# Patient Record
Sex: Female | Born: 1970 | Race: Black or African American | Hispanic: No | Smoking: Never smoker
Health system: Southern US, Community
[De-identification: ages and names within clinical notes are randomized; demographics above are authoritative.]

## PROBLEM LIST (undated history)

## (undated) DIAGNOSIS — E785 Hyperlipidemia, unspecified: Secondary | ICD-10-CM

## (undated) DIAGNOSIS — G4730 Sleep apnea, unspecified: Secondary | ICD-10-CM

## (undated) DIAGNOSIS — E119 Type 2 diabetes mellitus without complications: Secondary | ICD-10-CM

## (undated) DIAGNOSIS — E079 Disorder of thyroid, unspecified: Secondary | ICD-10-CM

## (undated) HISTORY — PX: OTHER SURGICAL HISTORY: SHX169

## (undated) HISTORY — PX: ABDOMINAL HYSTERECTOMY: SHX81

## (undated) HISTORY — DX: Disorder of thyroid, unspecified: E07.9

## (undated) HISTORY — PX: TUBAL LIGATION: SHX77

---

## 2004-12-04 ENCOUNTER — Ambulatory Visit: Payer: Self-pay

## 2005-06-08 ENCOUNTER — Emergency Department: Payer: Self-pay | Admitting: Emergency Medicine

## 2009-06-11 LAB — HM PAP SMEAR: HM Pap smear: NEGATIVE

## 2010-09-29 ENCOUNTER — Ambulatory Visit: Payer: Self-pay | Admitting: Internal Medicine

## 2010-09-29 IMAGING — US TRANSABDOMINAL ULTRASOUND OF PELVIS
1 series · 17 of 25 positions shown · non-contrast
Comparison: none

REASON FOR EXAM: adnexal pain on pelvic exam
COMMENTS:

[Series 1: transabdominal ultrasound of pelvis · 17 of 85 slices shown]
[im 1/85]
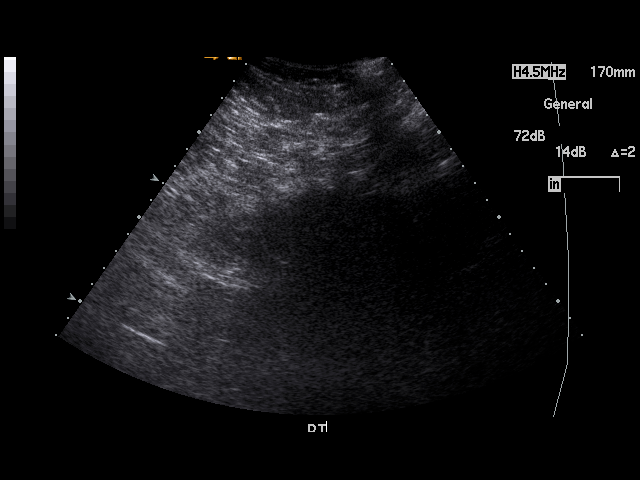
[im 8/85]
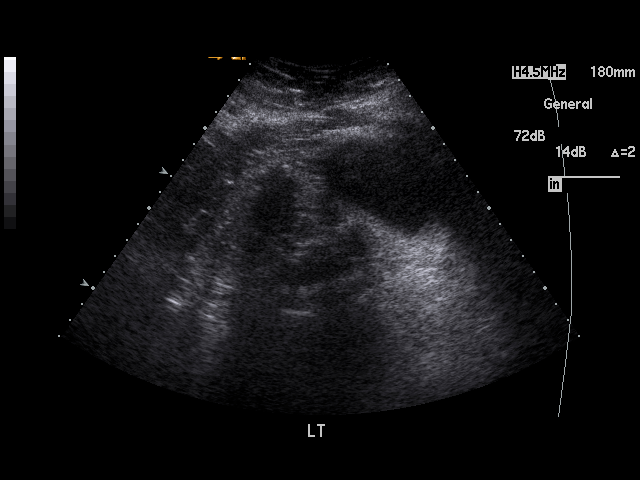
[im 11/85]
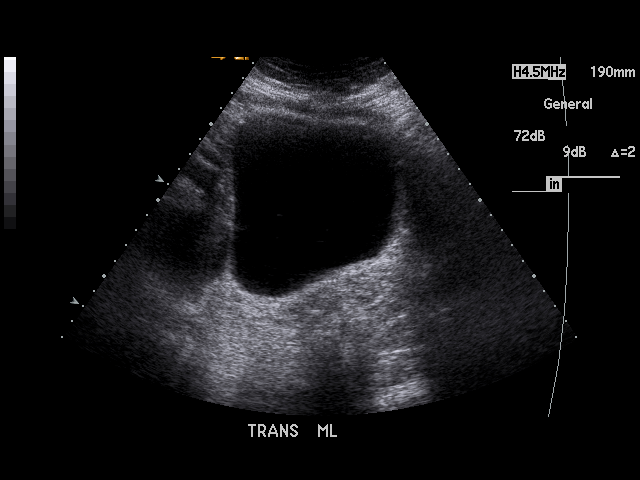
[im 18/85]
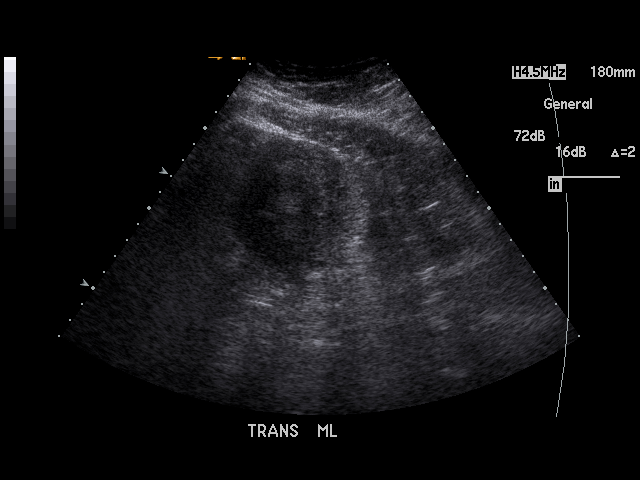
[im 22/85]
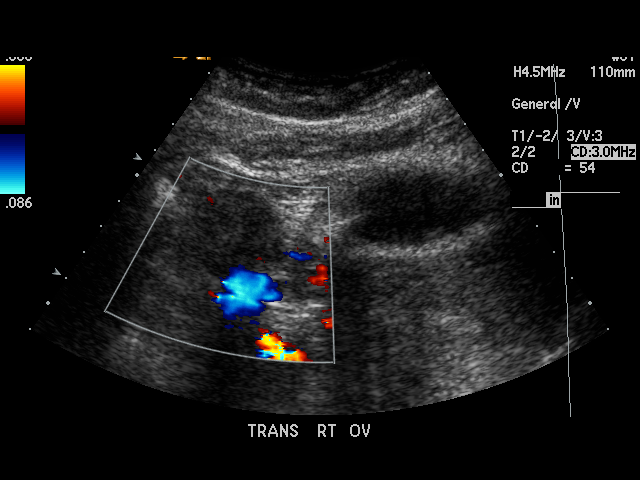
[im 29/85]
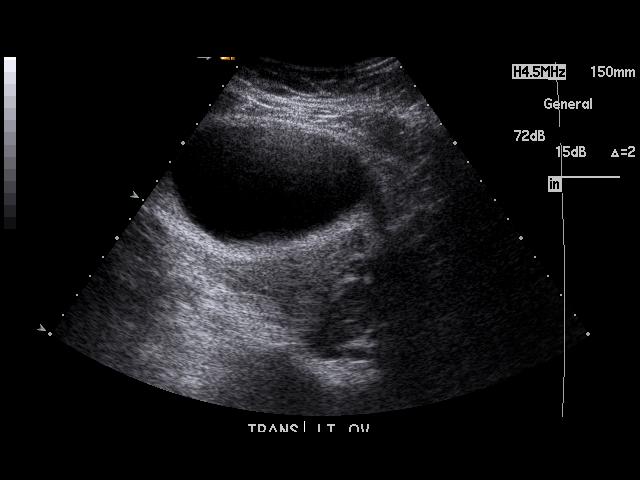
[im 32/85]
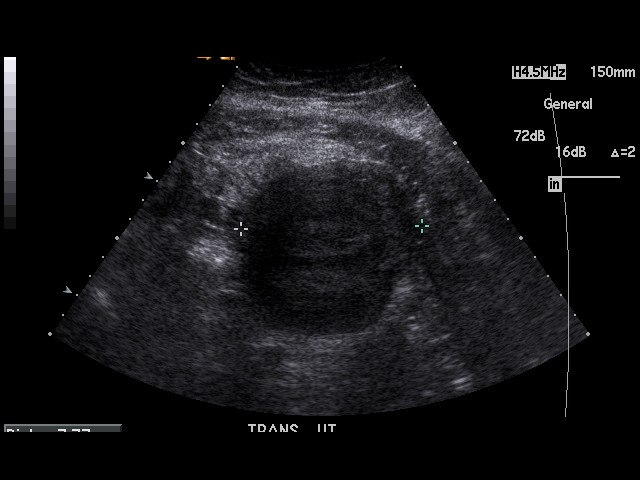
[im 39/85]
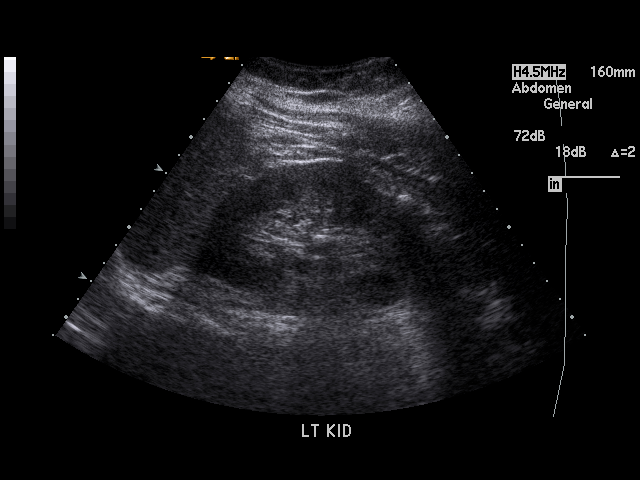
[im 43/85]
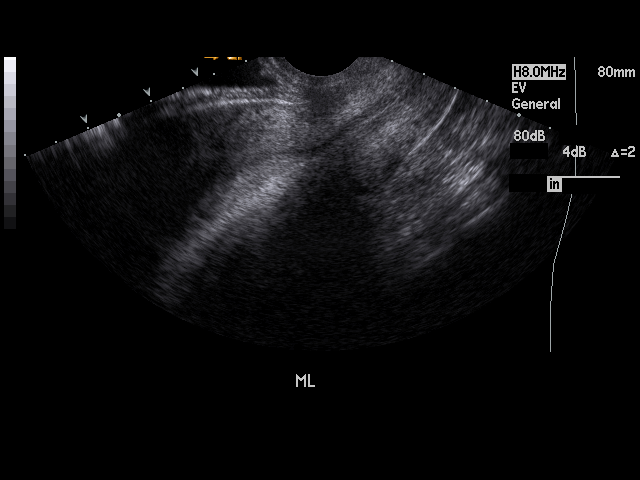
[im 46/85]
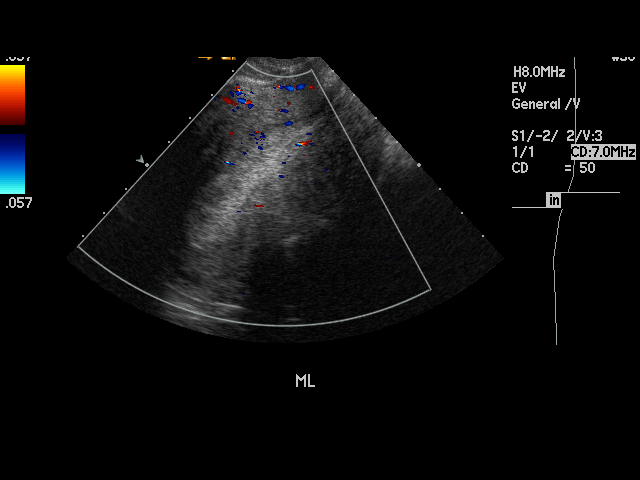
[im 53/85]
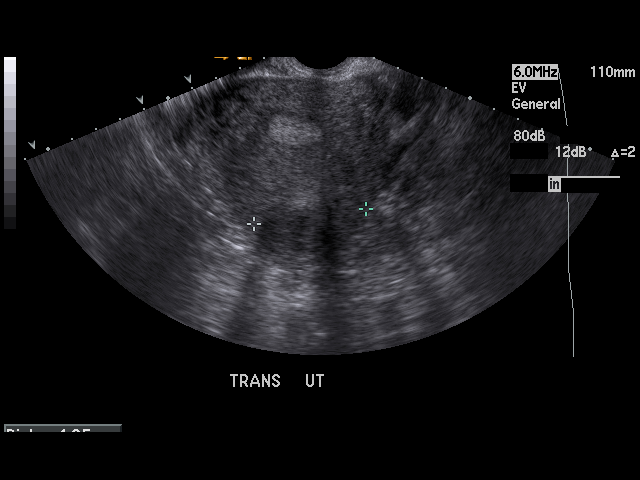
[im 57/85]
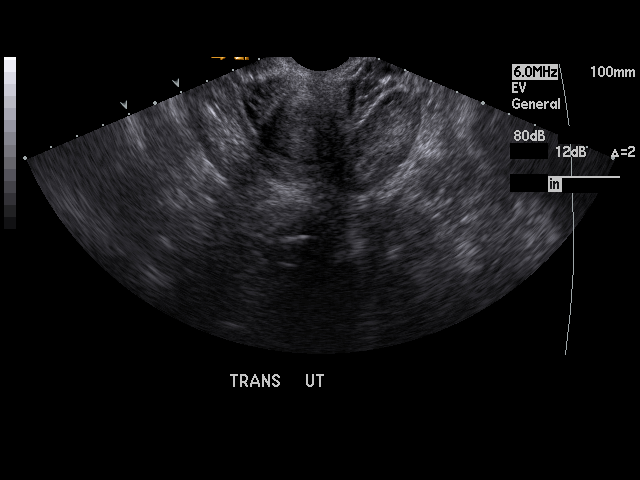
[im 64/85]
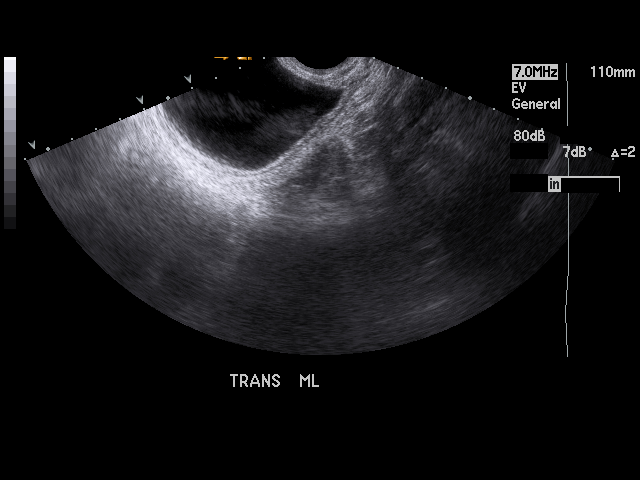
[im 67/85]
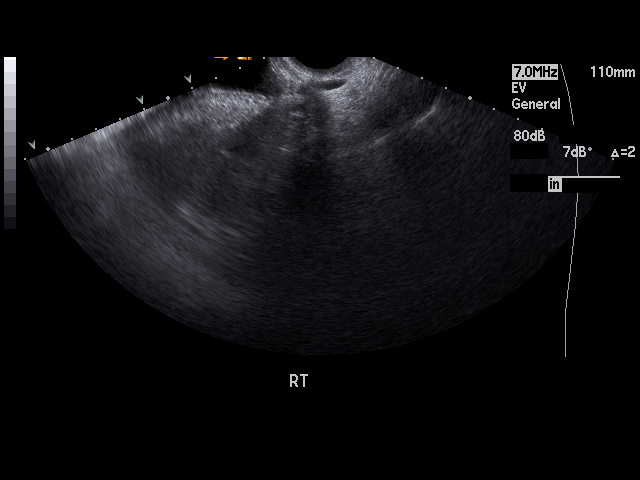
[im 74/85]
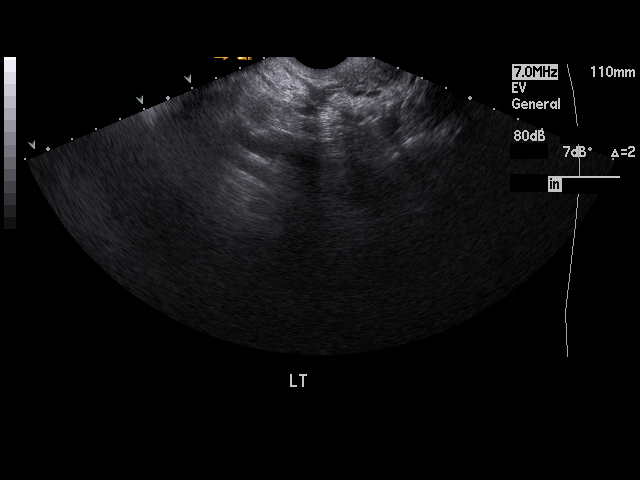
[im 78/85]
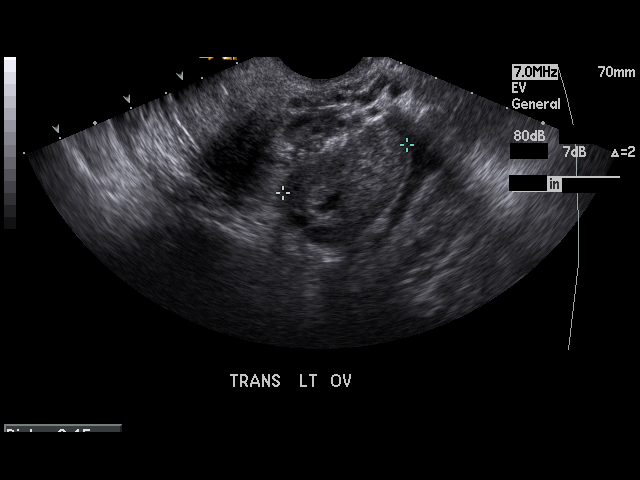
[im 85/85]
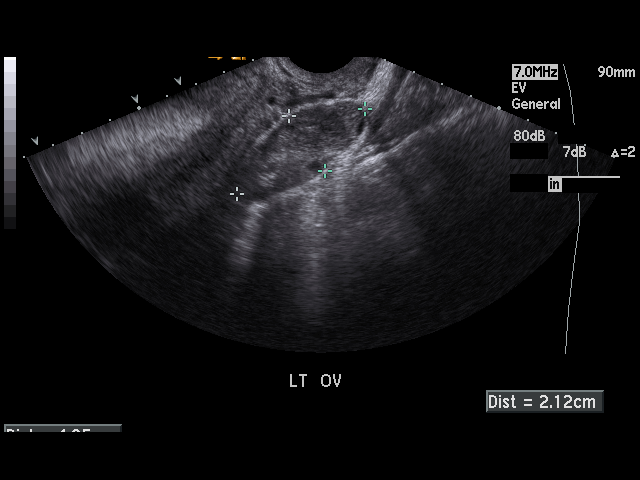

[17 of 25 positions shown; findings below may reference images not displayed]

PROCEDURE:     YIYIN - YIYIN PELVIS NON-OB W/TRANSVAGINAL  - [DATE]  [DATE]

RESULT:     Transabdominal and endovaginal ultrasound was performed. The
uterus measures 15 cm x 7.55 cm x 7.77 cm.  In the uterine fundus, there is
a 4.68 cm mass consistent with a uterine fibroid. No other uterine masses
are seen. The endometrium measures 1.63 cm in thickness. The right and left
ovaries are visualized. The right ovary measures 3.77 cm at maximum diameter
and the left ovary measures 4.95 cm at maximum diameter. Vascular flow is
observed in each ovary on Doppler examination. No abnormal adnexal masses
are seen. No free fluid is observed in the pelvis. The visualized portion of
the urinary bladder is normal in appearance. The kidneys show no
hydronephrosis.
IMPRESSION: 1.  There is a 4.68 cm uterine mass consistent with a uterine fibroid at the
uterine fundus.
2.  Otherwise, normal study.

## 2010-11-03 ENCOUNTER — Ambulatory Visit: Payer: Self-pay | Admitting: Obstetrics and Gynecology

## 2010-11-10 ENCOUNTER — Inpatient Hospital Stay: Payer: Self-pay | Admitting: Obstetrics and Gynecology

## 2010-12-02 ENCOUNTER — Ambulatory Visit: Payer: Self-pay | Admitting: Internal Medicine

## 2010-12-02 IMAGING — MG MAM DGTL SCREENING MAMMO W/CAD
1 series · 4 of 4 positions shown · non-contrast
Comparison: none

REASON FOR EXAM: SCR MAMMO
COMMENTS:

PROCEDURE:     MAM - MAM DGTL SCREENING MAMMO W/CAD  - [DATE]  [DATE]
RESULT:
No dominant masses or pathologic clustered calcifications demonstrated. CAD
evaluation is nonfocal.

[Series 8398: R CC · right · 4 of 4 slices shown]
[im 1/4]
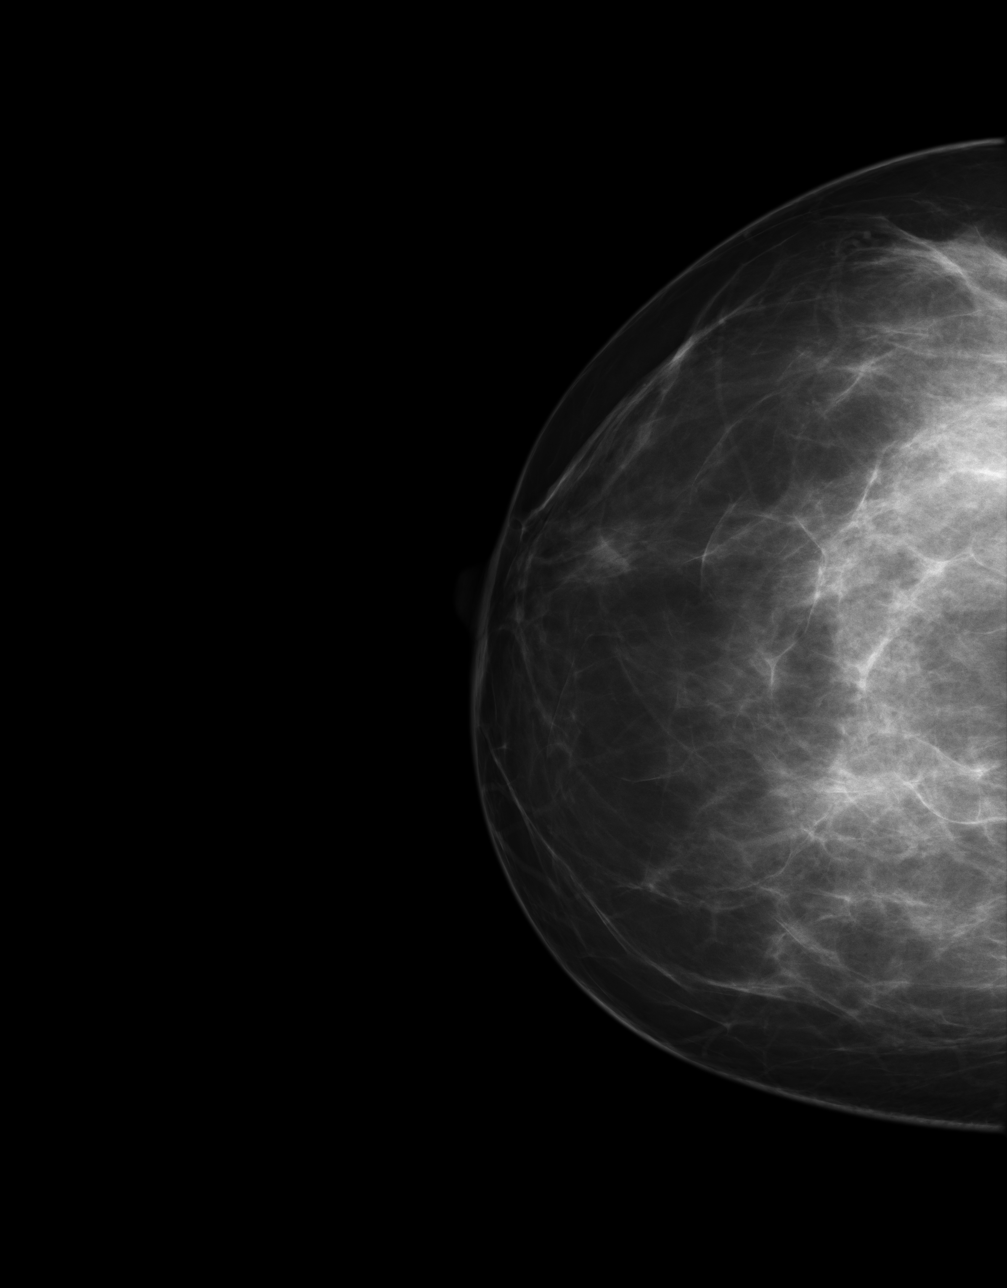
[im 2/4]
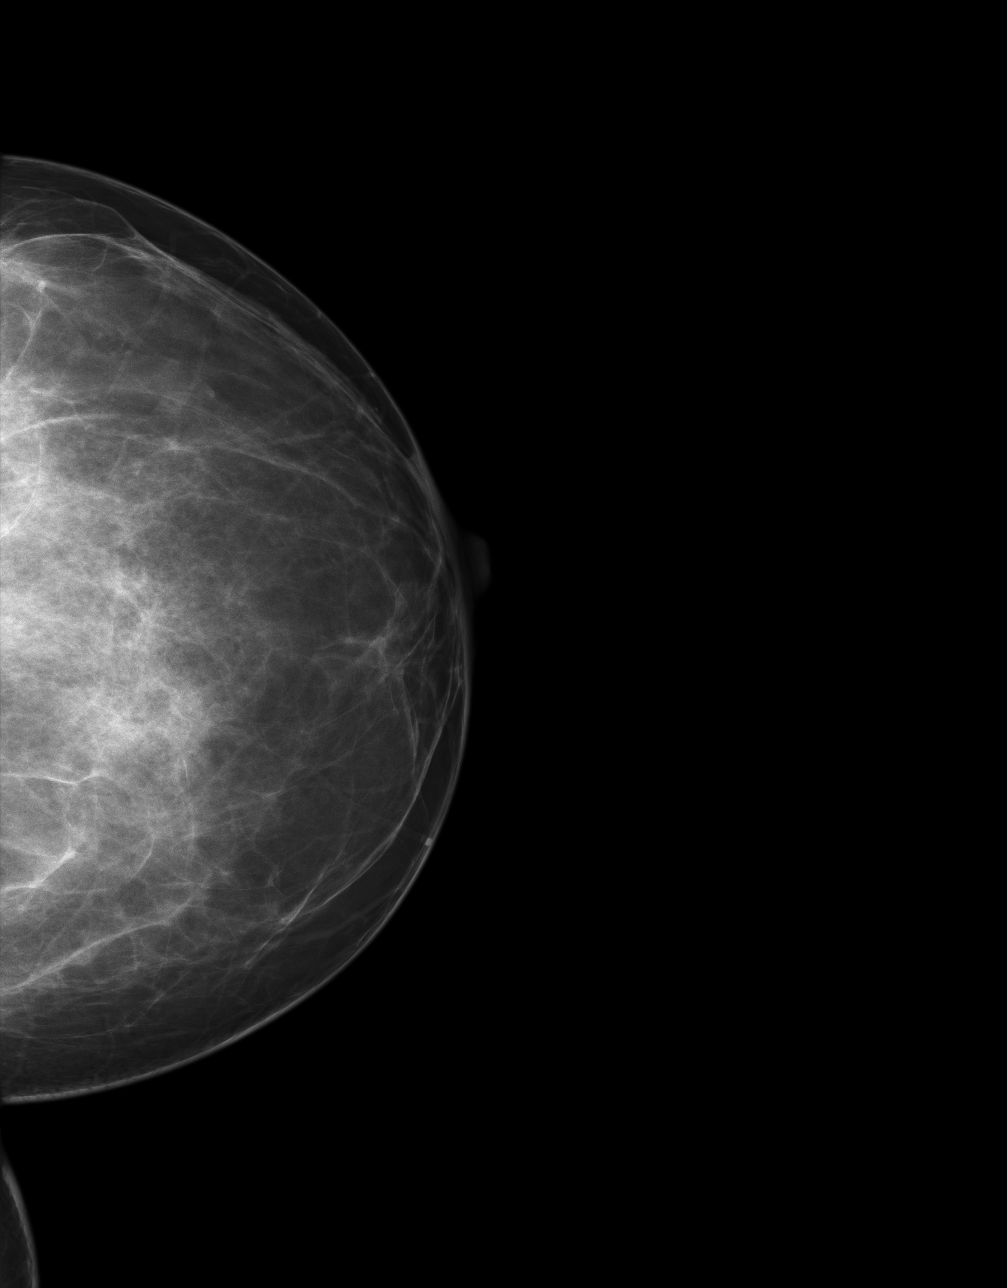
[im 3/4]
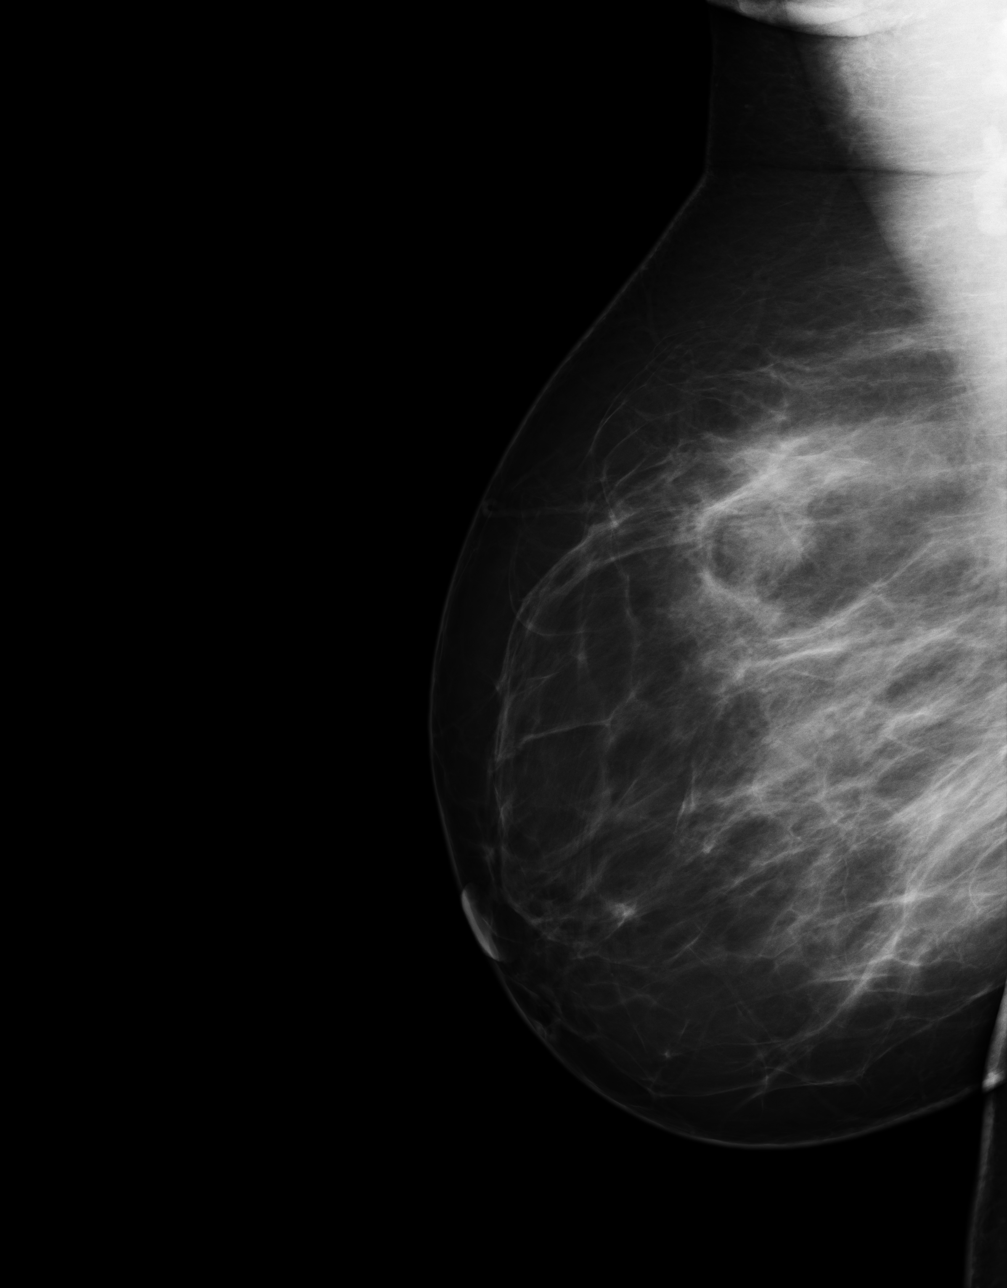
[im 4/4]
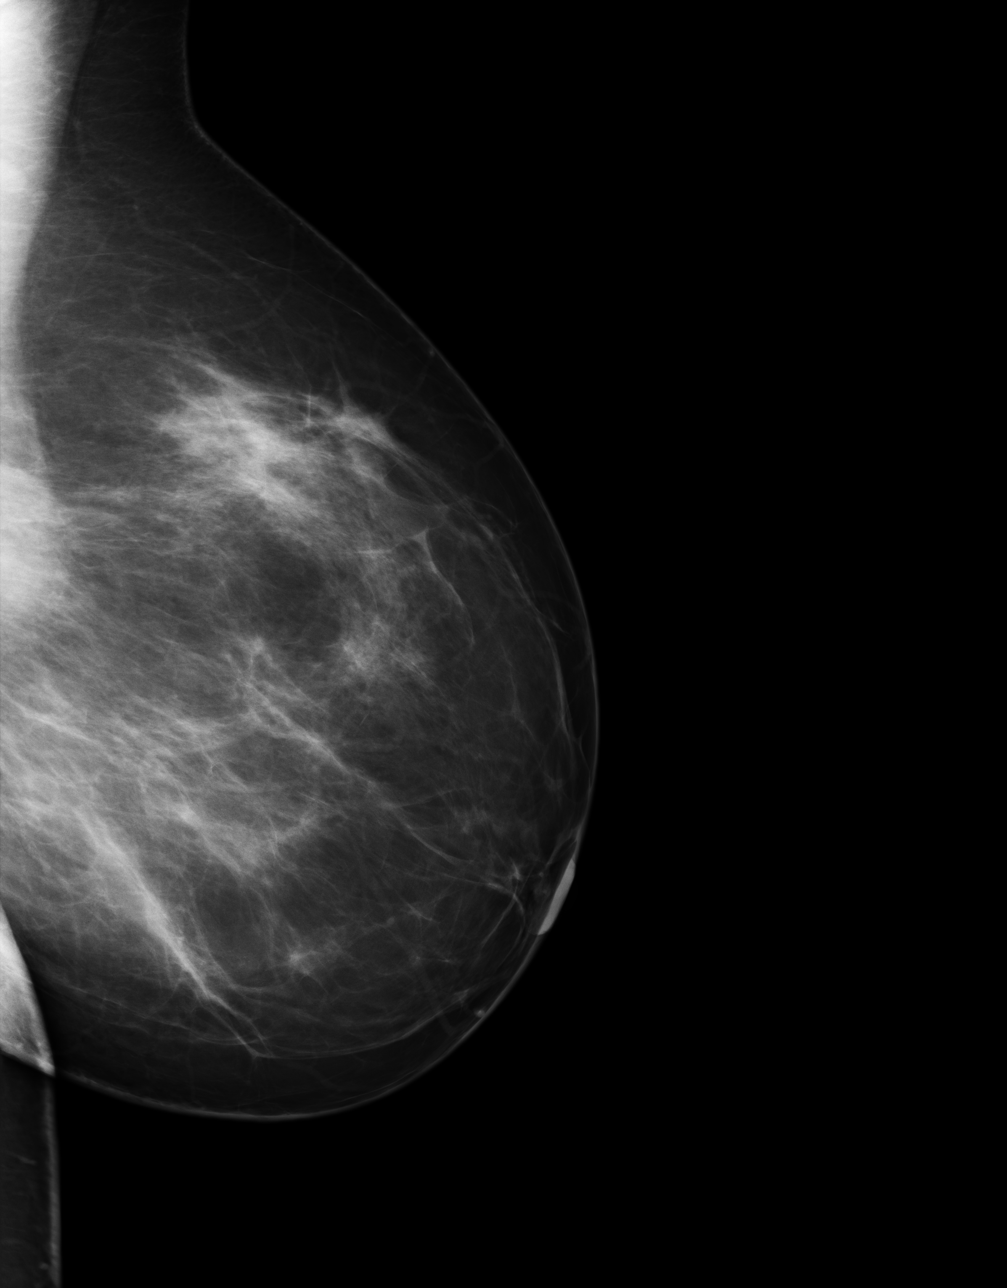

[4 of 4 positions shown; findings below may reference images not displayed]

IMPRESSION: Benign exam. Routine yearly follow-up mammograms suggested.

BI-RADS: Category 2 - Benign Findings

Thank you for this opportunity to contribute to the care of your patient.

A NEGATIVE MAMMOGRAM REPORT DOES NOT PRECLUDE BIOPSY OR OTHER EVALUATION OF
A CLINICALLY PALPABLE OR OTHERWISE SUSPICIOUS MASS OR LESION. BREAST CANCER
MAY NOT BE DETECTED BY MAMMOGRAPHY IN UP TO 10% OF CASES.

## 2011-11-19 ENCOUNTER — Emergency Department: Payer: Self-pay | Admitting: Emergency Medicine

## 2014-01-14 ENCOUNTER — Emergency Department: Payer: Self-pay | Admitting: Emergency Medicine

## 2014-01-14 IMAGING — CR RIGHT HAND - COMPLETE 3+ VIEW
1 series · 4 of 4 positions shown · non-contrast
Comparison: None.

CLINICAL DATA: Right hand and wrist pain since yesterday morning.
Woke up with pain and swelling. No known injury.

EXAM:
RIGHT HAND - COMPLETE 3+ VIEW

[Series 1: pa · 0.17mm/px · 4 of 4 slices shown]
[im 1/4]
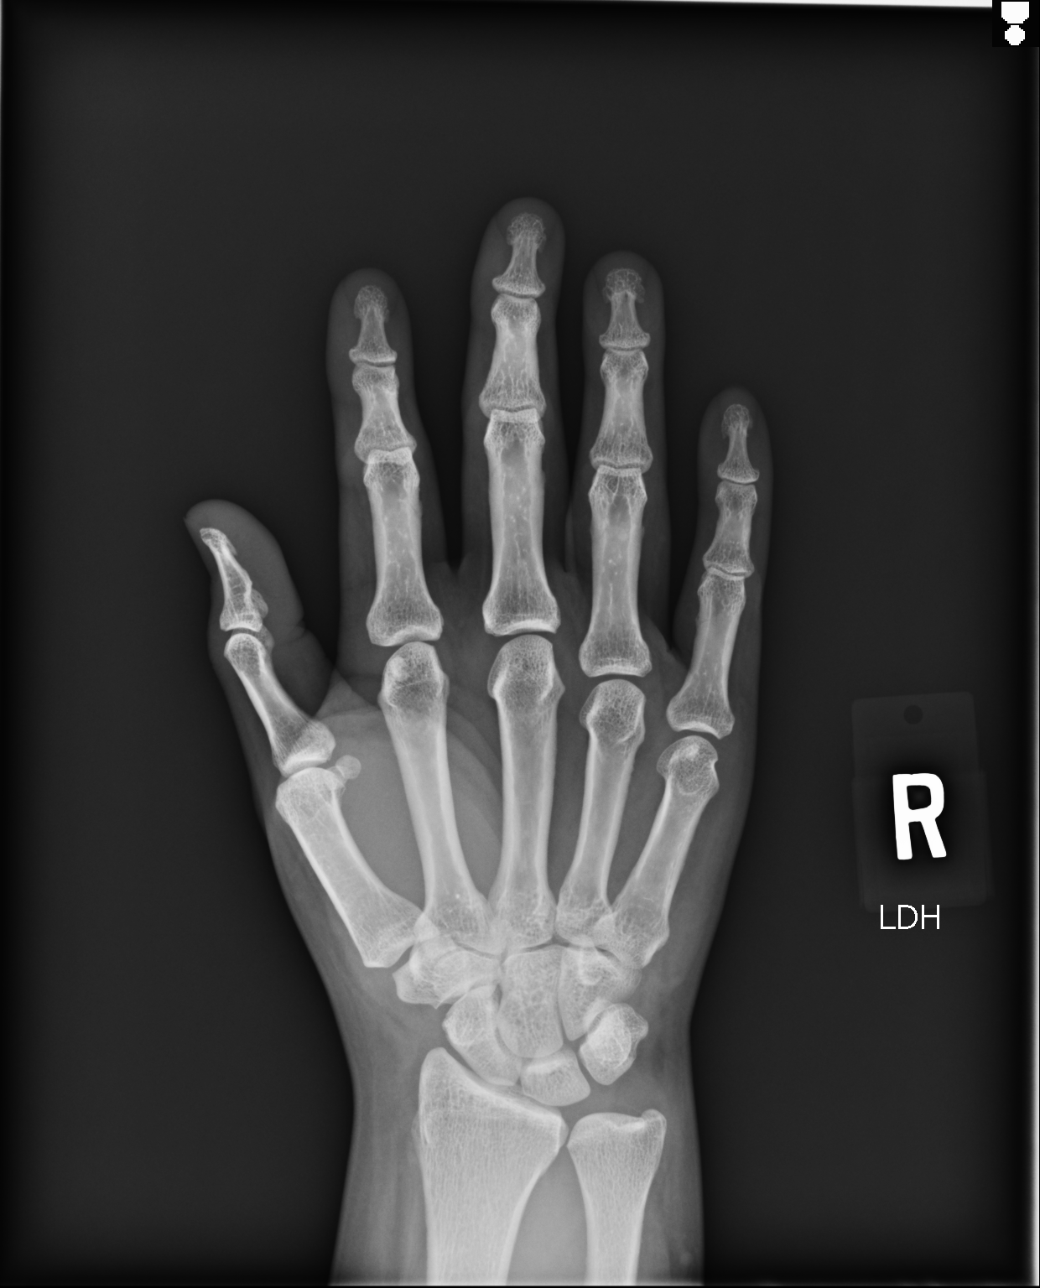
[im 2/4]
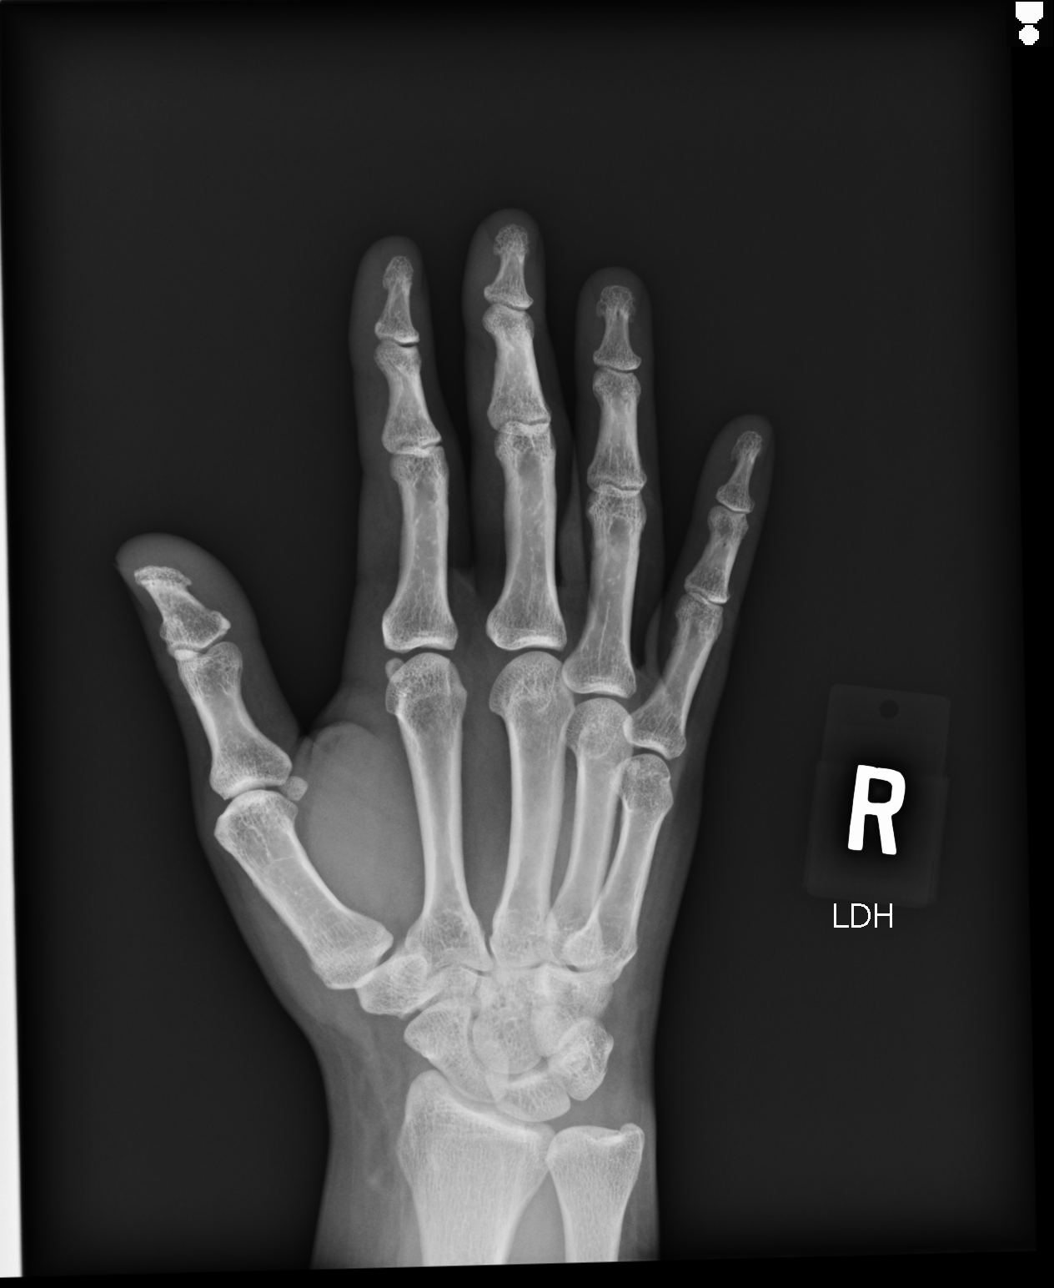
[im 3/4]
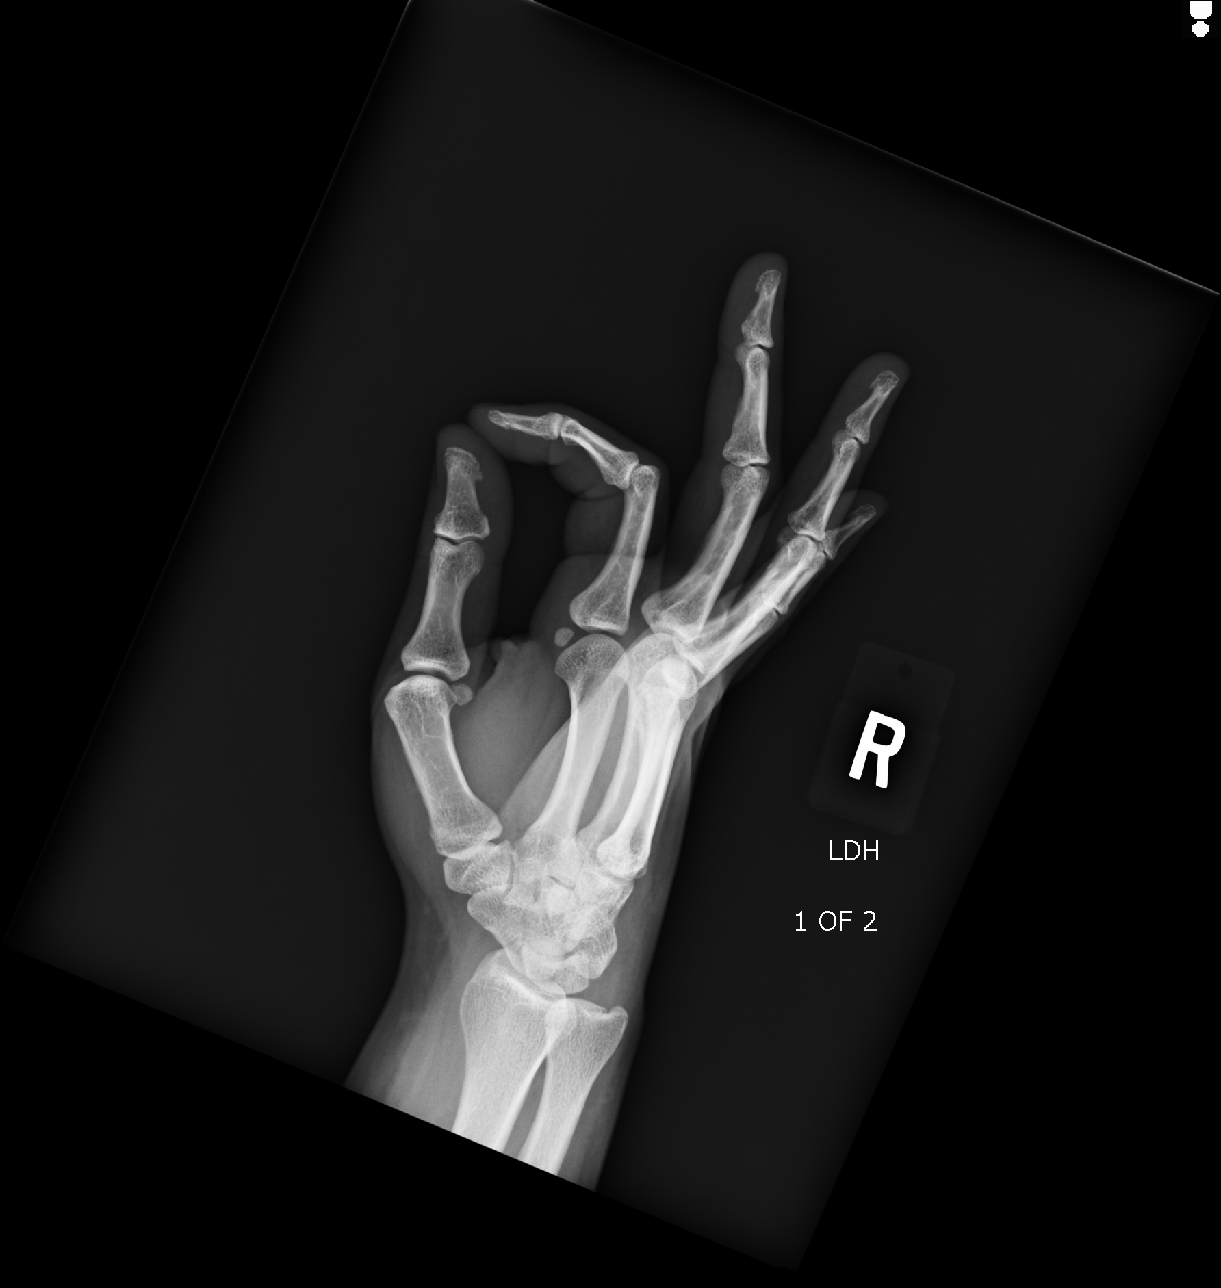
[im 4/4]
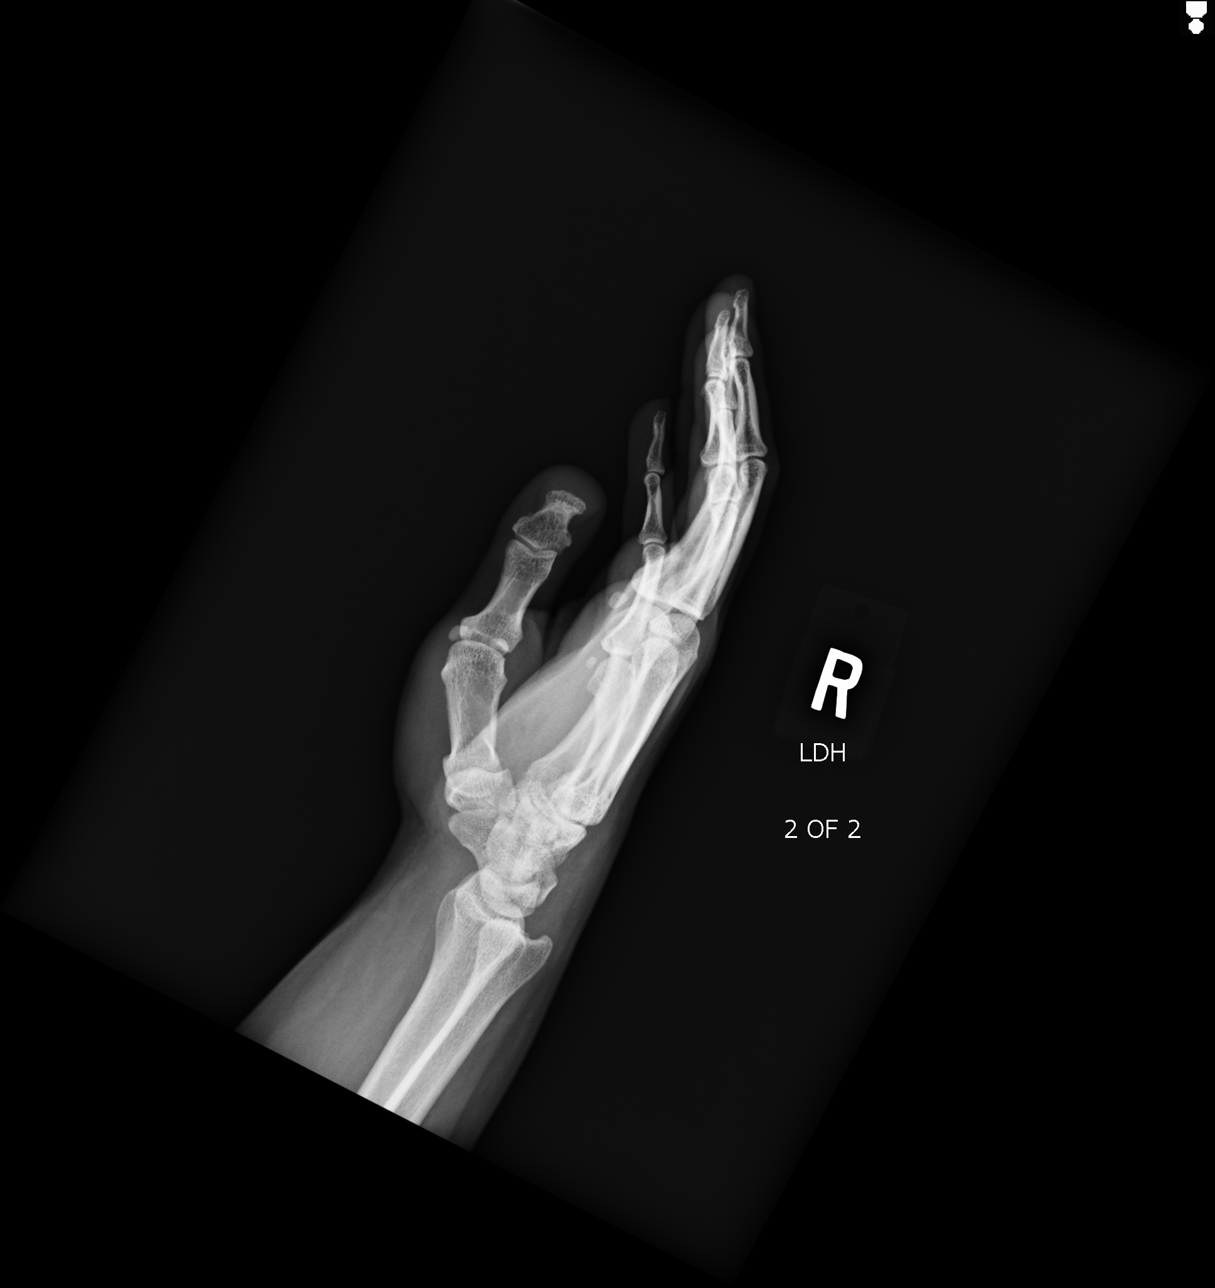

[4 of 4 positions shown; findings below may reference images not displayed]

FINDINGS: There is no evidence of fracture or dislocation. There is no
evidence of arthropathy or other focal bone abnormality. Soft
tissues are unremarkable.
IMPRESSION: Negative.

## 2014-10-30 ENCOUNTER — Ambulatory Visit: Payer: Self-pay

## 2014-10-30 ENCOUNTER — Encounter: Payer: 59 | Admitting: Podiatry

## 2014-11-08 NOTE — Progress Notes (Signed)
This encounter was created in error - please disregard.

## 2014-11-11 ENCOUNTER — Ambulatory Visit (INDEPENDENT_AMBULATORY_CARE_PROVIDER_SITE_OTHER): Payer: 59

## 2014-11-11 ENCOUNTER — Ambulatory Visit (INDEPENDENT_AMBULATORY_CARE_PROVIDER_SITE_OTHER): Payer: 59 | Admitting: Podiatry

## 2014-11-11 ENCOUNTER — Encounter: Payer: Self-pay | Admitting: Podiatry

## 2014-11-11 VITALS — BP 114/80 | HR 90 | Resp 12

## 2014-11-11 DIAGNOSIS — M779 Enthesopathy, unspecified: Secondary | ICD-10-CM

## 2014-11-11 DIAGNOSIS — G5790 Unspecified mononeuropathy of unspecified lower limb: Secondary | ICD-10-CM | POA: Diagnosis not present

## 2014-11-11 NOTE — Progress Notes (Signed)
   Subjective:    Patient ID: Leslie Meadows, female    DOB: [birth year], 44 y.o.   MRN: [id number]  HPI  : she presents today with a chief complaint of numbness to the plantar distal aspect of her hallux toes bilateral. She states that this is been going on now for approximately 4 months. She relates a history underactive thyroid disease no alcoholism no smoking no diabetes and no cancers. She denies trauma she denies any back problems. States that it seems to bother her worse in bed. She states that she just had blood drawn this morning from her primary care provider [facility] to find an answer of her numbness in her feet. She does relate polyphagia and polydipsia.   Review of Systems  All other systems reviewed and are negative.      Objective:   Physical Exam  : 44 year old female in no apparent distress presents today vital signs stable alert and oriented 3 pulses are strongly palpable bilateral. Neurologic sensorium is intact percent Semmes-Weinstein monofilament but slightly diminished to the tufts of the toes in question. She also has vibratory sensation loss to the tuft of the toes as well. Proprioceptive fibers were still intact... Deep tendon  Reflexes or normal bilateral. Muscle strength normal bilateral. Orthopedic evaluation of his resolved joints distal to the ankle range of motion without crepitation. Cutaneous evaluation demonstrates all joints distal to the ankle for range of motion without crepitation. Cutaneous evaluation demonstrate supple well-hydrated cutis no erythema edema saline as drainage or odor. 3 views bilateral radiographs do not demonstrate any type of osseus abnormalities in this area.      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    plan currently we are going to await her final results from the blood work. She will bring me a copy of this result in 2 weeks.   Arbutus Ped DPM

## 2014-11-25 ENCOUNTER — Ambulatory Visit (INDEPENDENT_AMBULATORY_CARE_PROVIDER_SITE_OTHER): Payer: 59 | Admitting: Podiatry

## 2014-11-25 ENCOUNTER — Telehealth: Payer: Self-pay | Admitting: *Deleted

## 2014-11-25 DIAGNOSIS — G5790 Unspecified mononeuropathy of unspecified lower limb: Secondary | ICD-10-CM | POA: Diagnosis not present

## 2014-11-25 NOTE — Progress Notes (Signed)
She presents today with her left work which I reviewed thoroughly. She states that she still has considerable numbness with discomfort 5/10.  Objective: Vital signs stable alert and oriented 3. Pulses are strongly palpable. Neurologic sensorium is intact with exception of the hallux bilateral. She is complete numbness dorsal aspect plantar aspect of the toe. But this does not remain without some sensation. Her labs appear to be good with exception of vitamin D on the low end of the normal scale.  Assessment: Neuropathy hallux bilateral affecting her daily activities and ability to perform those activities.  Plan: Referring her to neurology for nerve conduction test.  Leslie Meadows DPM

## 2014-11-25 NOTE — Telephone Encounter (Addendum)
-----   Message from Kristian CoveyAshley E Prevette, [facility] sent at 11/25/2014 10:58 AM EDT ----- Regarding: Neurology referral Ordered are in!   Patient needs to be set up with Dr. Cristopher PeruHemang Shah, MD  Address: [address], [place], [place] [address] Phone: [phone] Fax: [phone]  Thanks!!  Faxed to Dr. Sherryll BurgerShah [phone].

## 2015-11-25 ENCOUNTER — Other Ambulatory Visit: Payer: Self-pay | Admitting: Nurse Practitioner

## 2015-11-25 DIAGNOSIS — Z1231 Encounter for screening mammogram for malignant neoplasm of breast: Secondary | ICD-10-CM

## 2015-12-26 ENCOUNTER — Ambulatory Visit
Admission: RE | Admit: 2015-12-26 | Discharge: 2015-12-26 | Disposition: A | Discharge status: Home / Self Care | Payer: BLUE CROSS/BLUE SHIELD | Source: Ambulatory Visit | Attending: Nurse Practitioner | Admitting: Nurse Practitioner

## 2015-12-26 ENCOUNTER — Encounter: Payer: Self-pay | Admitting: Radiology

## 2015-12-26 DIAGNOSIS — Z1231 Encounter for screening mammogram for malignant neoplasm of breast: Secondary | ICD-10-CM | POA: Insufficient documentation

## 2015-12-26 DIAGNOSIS — R921 Mammographic calcification found on diagnostic imaging of breast: Secondary | ICD-10-CM | POA: Insufficient documentation

## 2015-12-26 IMAGING — MG MM DIGITAL SCREENING BILAT W/ TOMO W/ CAD
8 of 12 series · 8 of 28 positions shown · non-contrast
Comparison: Previous exam(s).

CLINICAL DATA: Screening.

EXAM:
2D DIGITAL SCREENING BILATERAL MAMMOGRAM WITH CAD AND ADJUNCT TOMO

[R CC synth-2D]
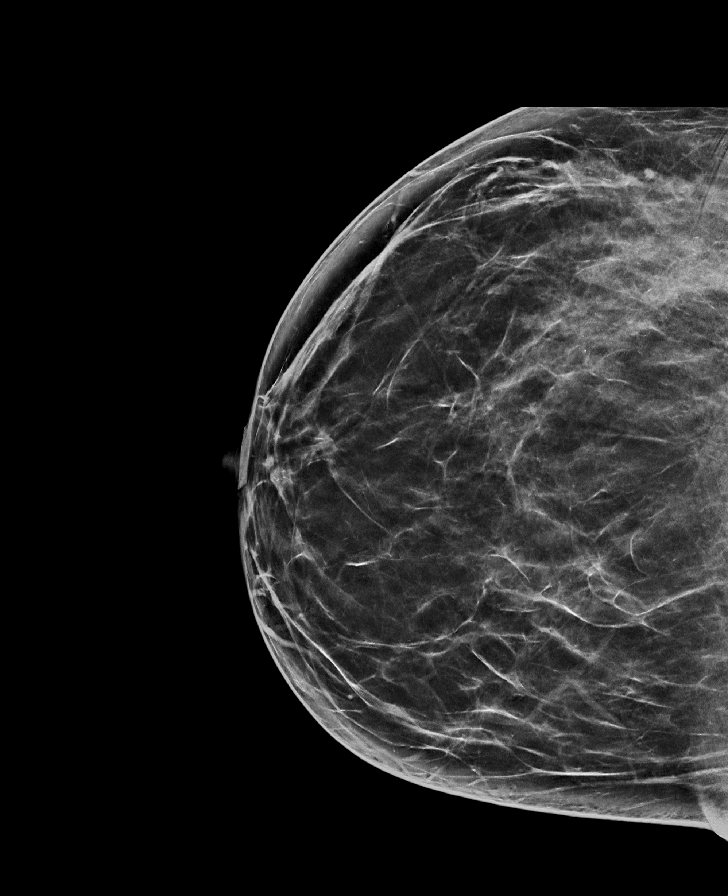

[L CC]
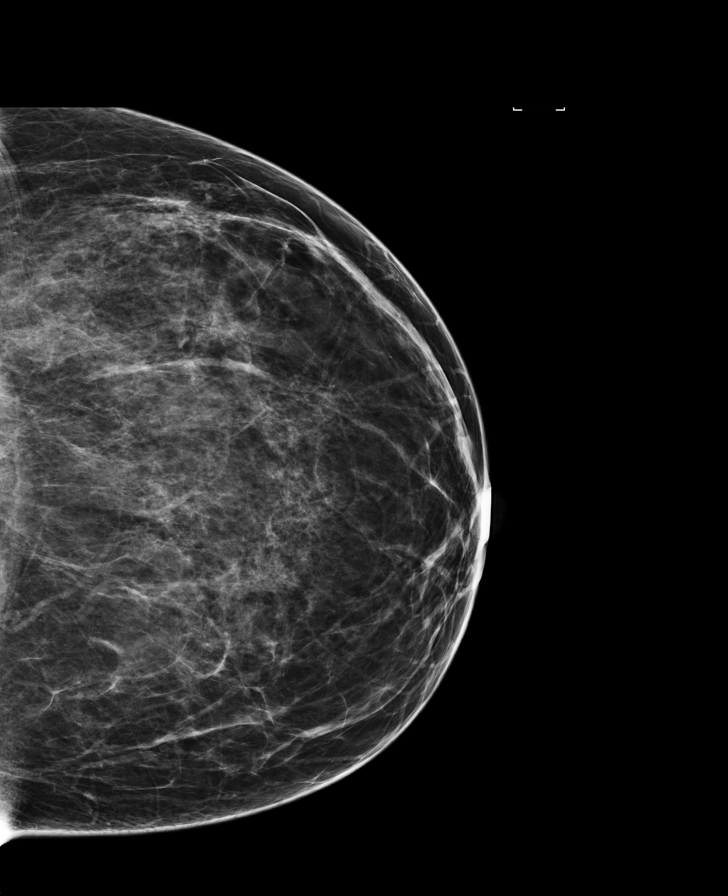

[R MLO]
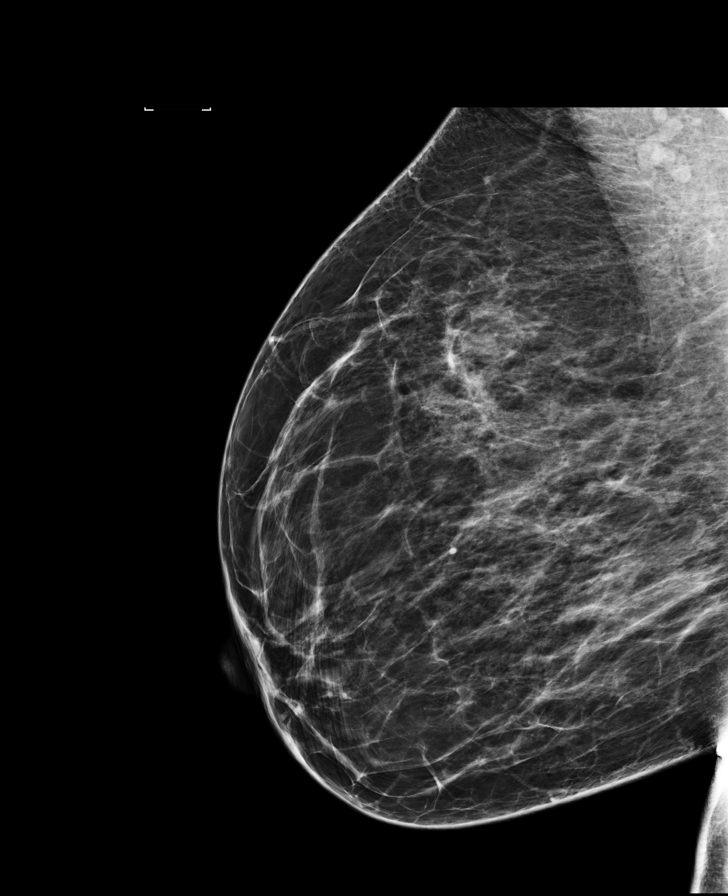

[L MLO]
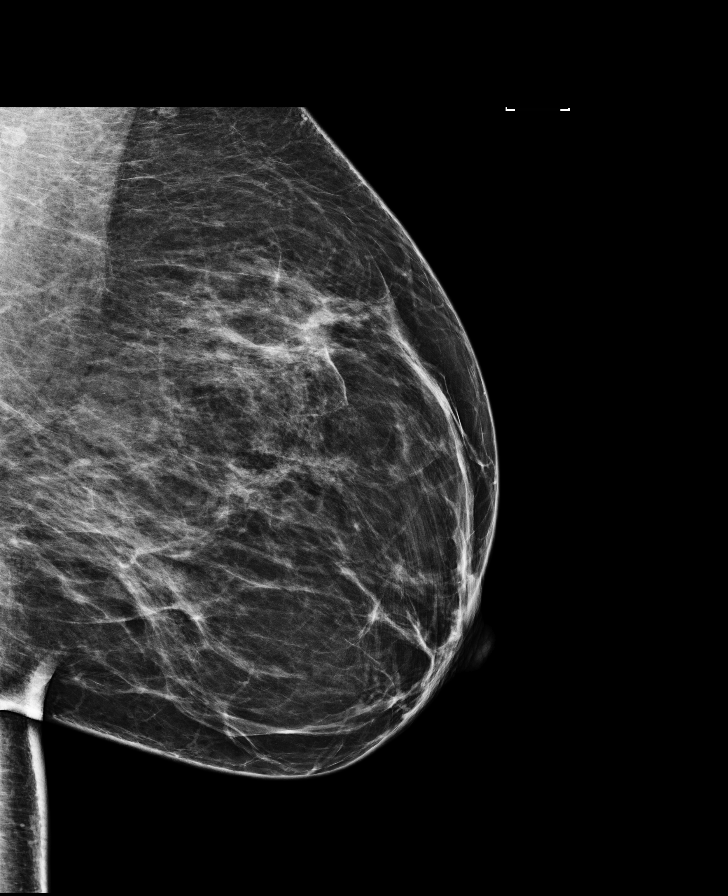

[R CC]
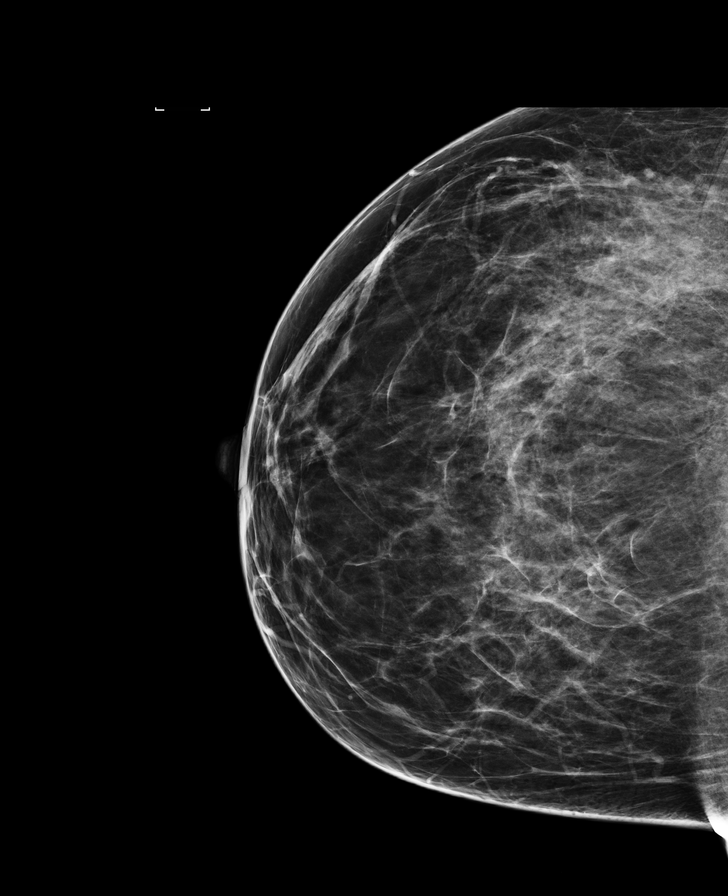

[R MLO synth-2D]
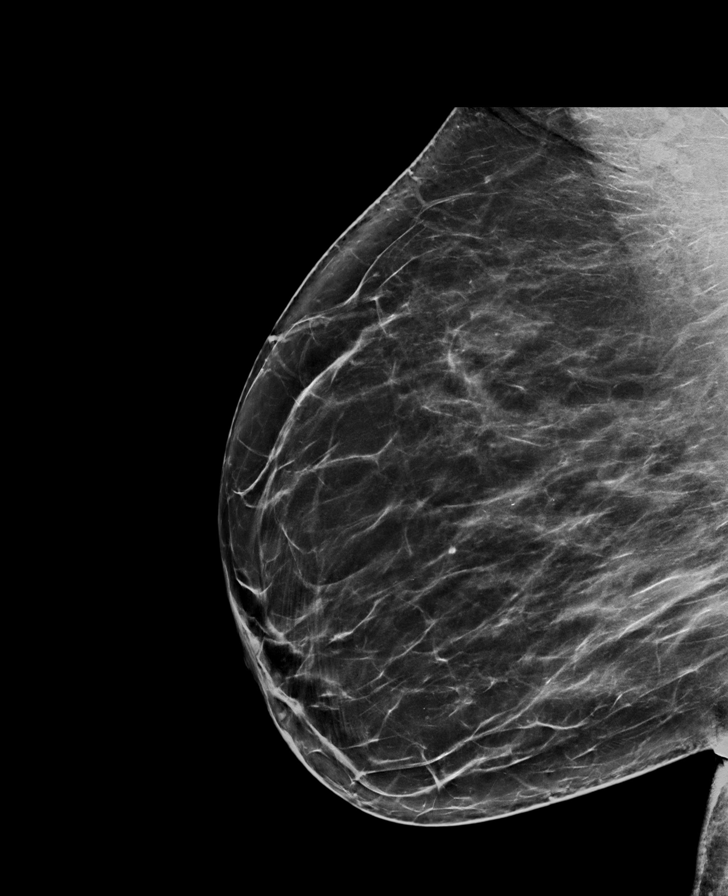

[L MLO synth-2D]
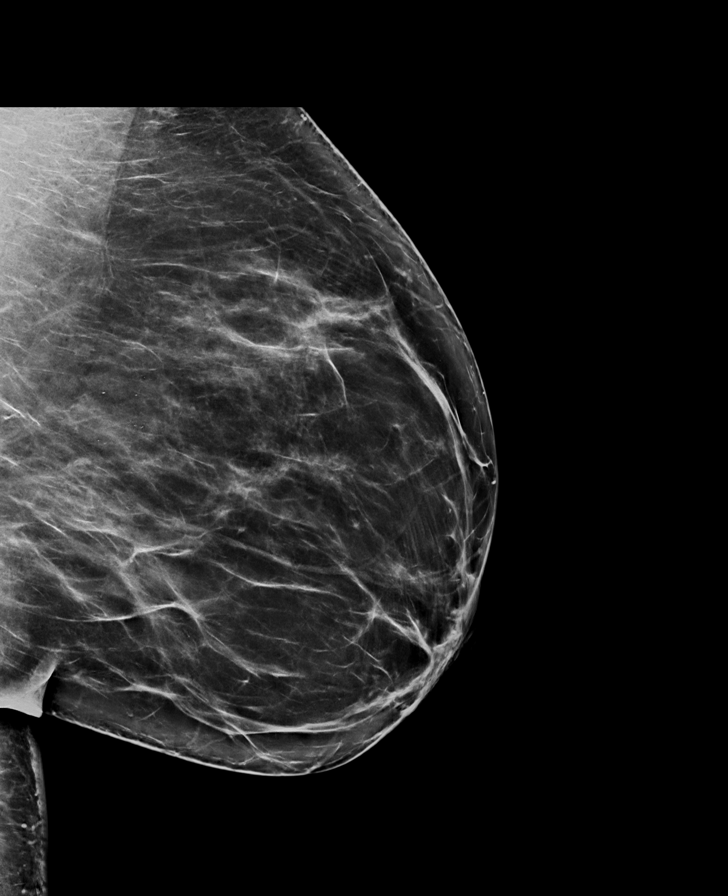

[L CC synth-2D]
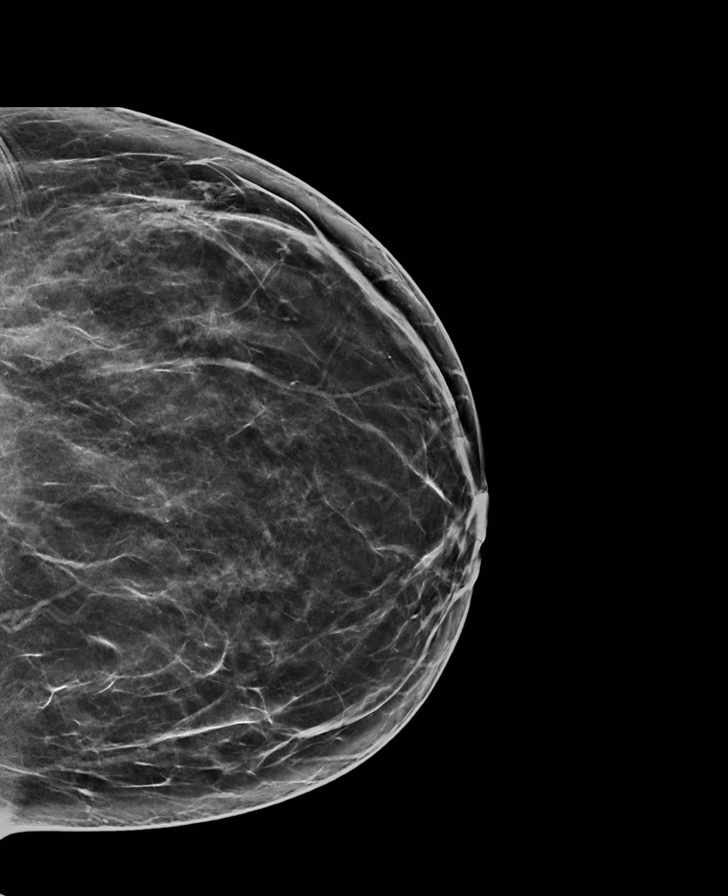

[8 of 28 positions shown; findings below may reference images not displayed]

ACR Breast Density Category b: There are scattered areas of
fibroglandular density.
FINDINGS: In the left breast, calcifications warrant further evaluation. In
the right breast, no findings suspicious for malignancy. Images were
processed with CAD.
IMPRESSION: Further evaluation is suggested for calcifications in the left
breast.

RECOMMENDATION:
Diagnostic mammogram of the left breast. (Code:[[place]])

The patient will be contacted regarding the findings, and additional
imaging will be scheduled.

BI-RADS CATEGORY  0: Incomplete. Need additional imaging evaluation
and/or prior mammograms for comparison.

## 2015-12-29 ENCOUNTER — Other Ambulatory Visit: Payer: Self-pay | Admitting: Nurse Practitioner

## 2015-12-29 DIAGNOSIS — R921 Mammographic calcification found on diagnostic imaging of breast: Secondary | ICD-10-CM

## 2016-01-15 ENCOUNTER — Ambulatory Visit
Admission: RE | Admit: 2016-01-15 | Discharge: 2016-01-15 | Disposition: A | Discharge status: Home / Self Care | Payer: BLUE CROSS/BLUE SHIELD | Source: Ambulatory Visit | Attending: Nurse Practitioner | Admitting: Nurse Practitioner

## 2016-01-15 DIAGNOSIS — R921 Mammographic calcification found on diagnostic imaging of breast: Secondary | ICD-10-CM

## 2016-01-15 DIAGNOSIS — N6489 Other specified disorders of breast: Secondary | ICD-10-CM | POA: Insufficient documentation

## 2016-01-15 IMAGING — MG MM DIGITAL DIAGNOSTIC UNILAT*L*
5 series · 5 of 5 positions shown · non-contrast
Comparison: Previous exam(s).

CLINICAL DATA: 45-year-old female presenting for evaluation of
calcifications identified on her screening mammogram.

EXAM:
DIGITAL DIAGNOSTIC LEFT MAMMOGRAM WITH CAD

[L ML (1 of 4)]
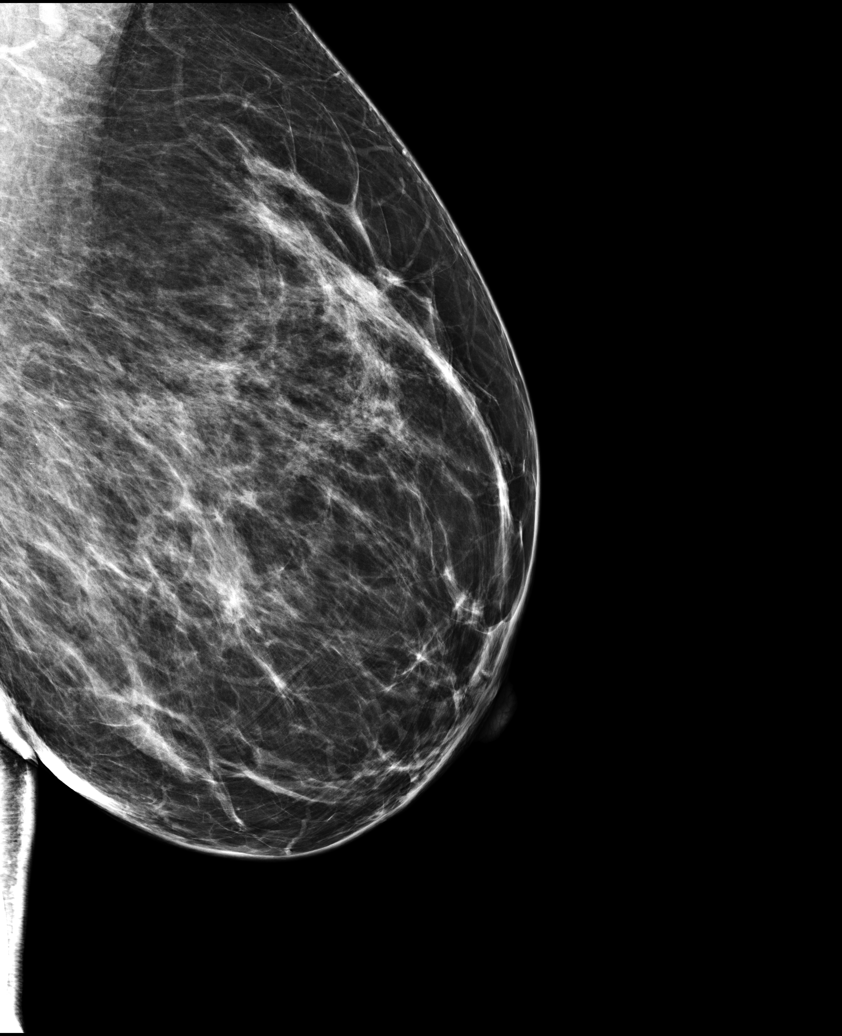

[L ML (2 of 4)]
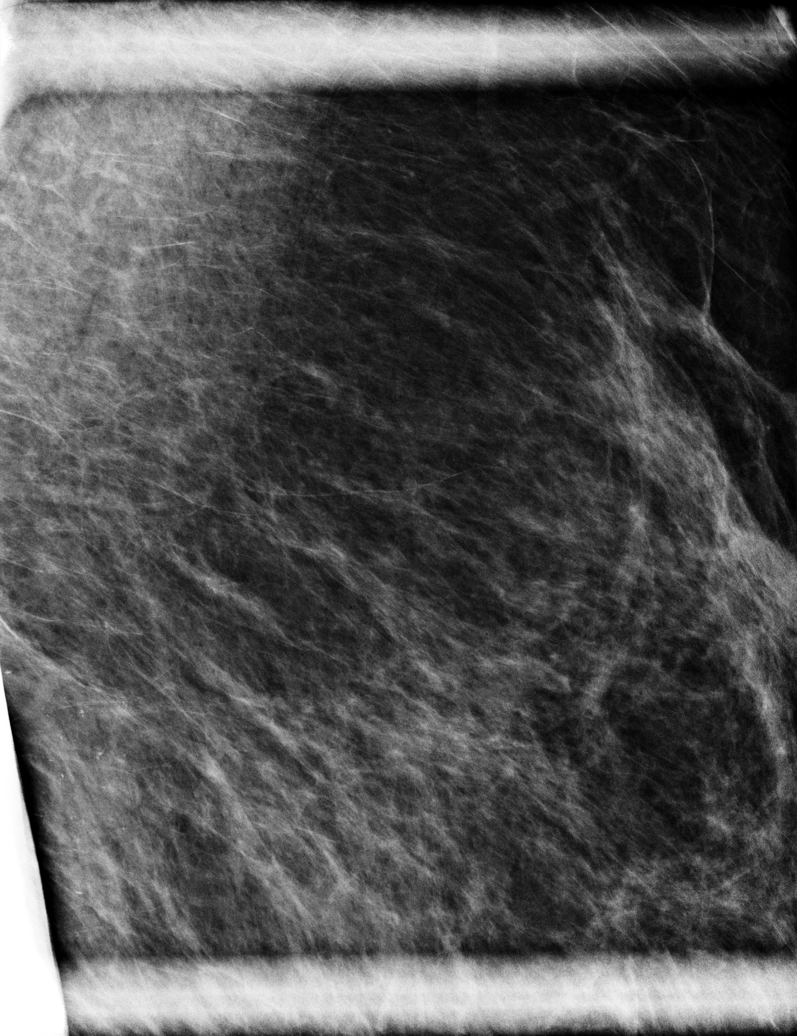

[L CC]
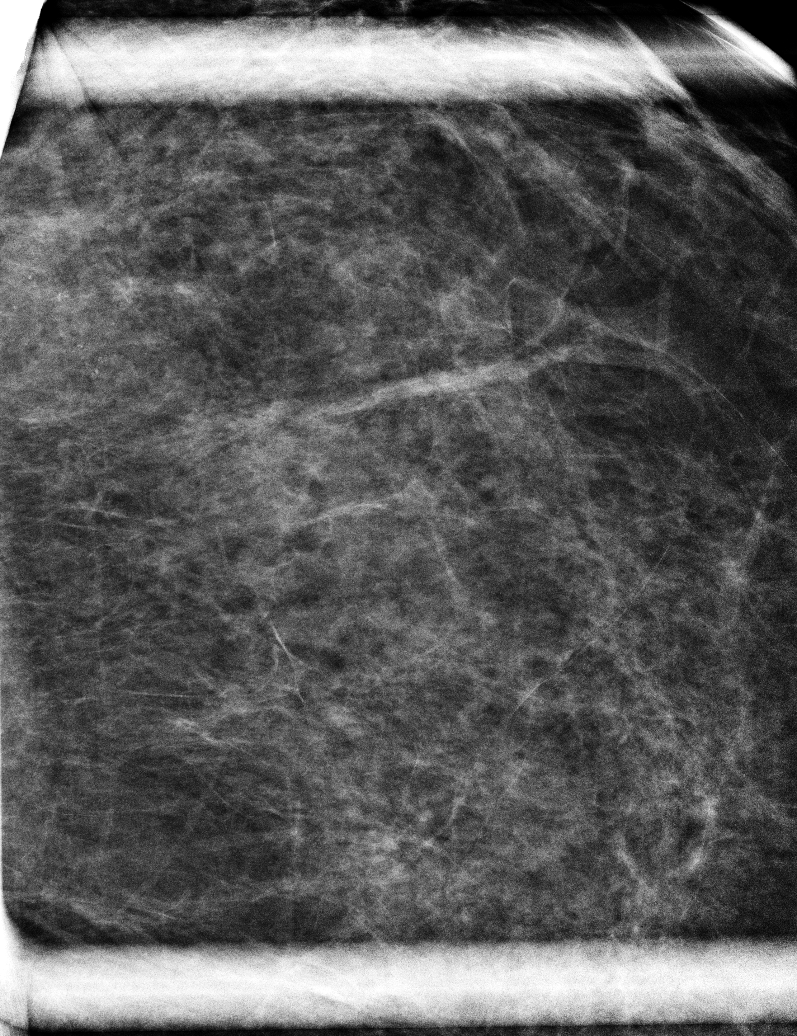

[L ML (3 of 4)]
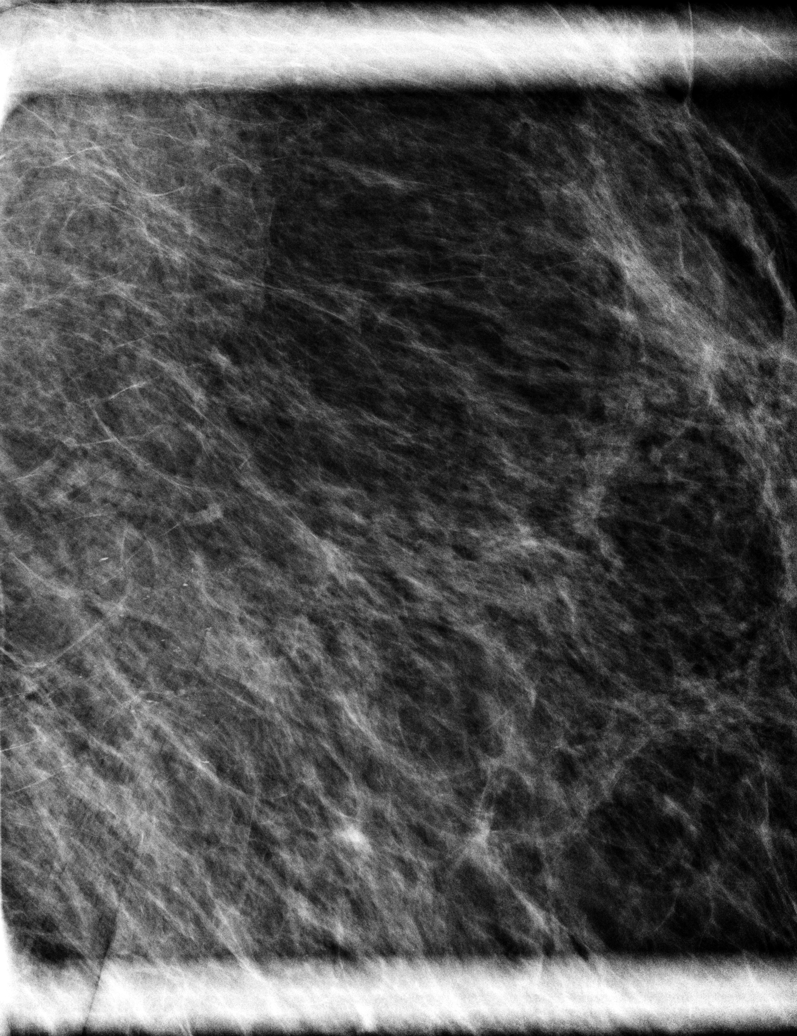

[L ML (4 of 4)]
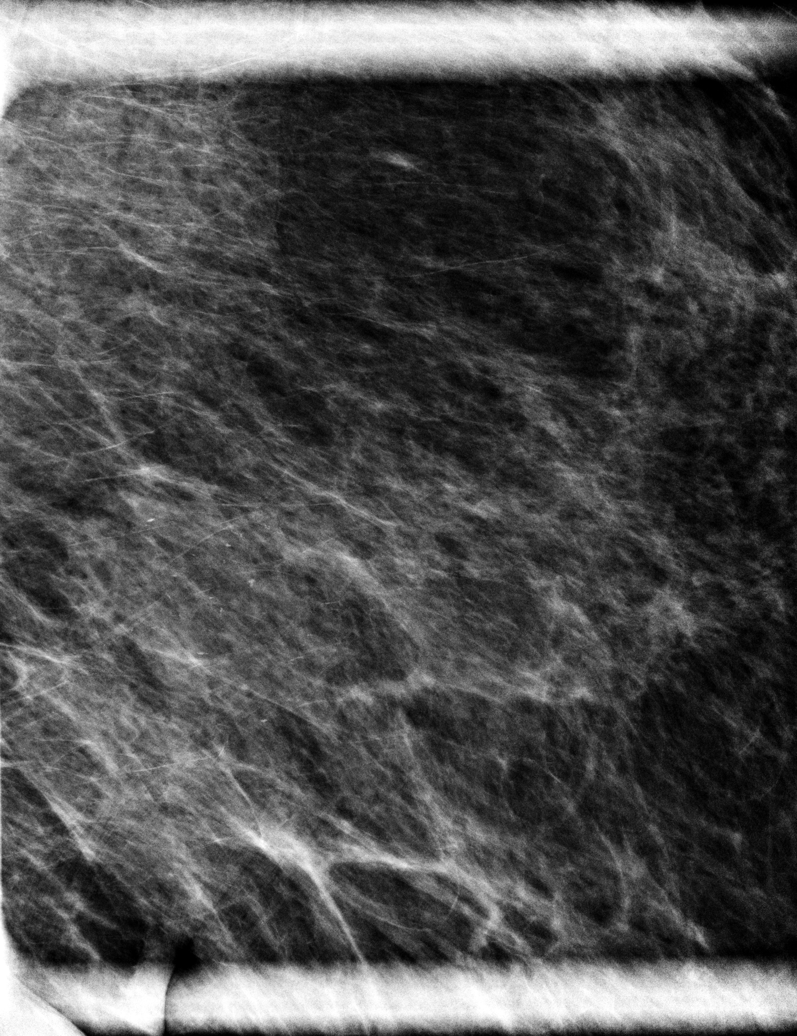

[5 of 5 positions shown; findings below may reference images not displayed]

ACR Breast Density Category b: There are scattered areas of
fibroglandular density.
FINDINGS: In the upper-outer quadrant of the left breast there is a 2.9 cm
loosely grouped area of calcifications which appear somewhat
amorphous on the CC view with several appearing curvilinear on the
true lateral view consistent with milk of calcium. There are several
calcifications in this group which do not definitely layer, though
they likely represent the same process.

Mammographic images were processed with CAD.
IMPRESSION: Some of the calcifications in the upper-outer quadrant of the left
breast layer consistent with milk of calcium, however several do
not. This all likely represents the same process, however follow-up
will be recommended.

RECOMMENDATION:
Six-month follow-up diagnostic left breast mammogram.

I have discussed the findings and recommendations with the patient.
Results were also provided in writing at the conclusion of the
visit. If applicable, a reminder letter will be sent to the patient
regarding the next appointment.

BI-RADS CATEGORY  3: Probably benign.

## 2016-01-19 ENCOUNTER — Other Ambulatory Visit: Payer: Self-pay | Admitting: Nurse Practitioner

## 2016-01-19 DIAGNOSIS — R921 Mammographic calcification found on diagnostic imaging of breast: Secondary | ICD-10-CM

## 2016-07-16 ENCOUNTER — Ambulatory Visit: Payer: BLUE CROSS/BLUE SHIELD

## 2016-07-16 ENCOUNTER — Other Ambulatory Visit: Payer: BLUE CROSS/BLUE SHIELD

## 2016-07-20 ENCOUNTER — Other Ambulatory Visit: Payer: BLUE CROSS/BLUE SHIELD

## 2016-08-16 ENCOUNTER — Other Ambulatory Visit: Payer: BLUE CROSS/BLUE SHIELD

## 2016-11-29 ENCOUNTER — Other Ambulatory Visit: Payer: Self-pay | Admitting: Nurse Practitioner

## 2016-11-29 DIAGNOSIS — Z1231 Encounter for screening mammogram for malignant neoplasm of breast: Secondary | ICD-10-CM

## 2016-11-29 DIAGNOSIS — R921 Mammographic calcification found on diagnostic imaging of breast: Secondary | ICD-10-CM

## 2016-12-29 ENCOUNTER — Ambulatory Visit
Admission: RE | Admit: 2016-12-29 | Discharge: 2016-12-29 | Disposition: A | Discharge status: Home / Self Care | Payer: BLUE CROSS/BLUE SHIELD | Source: Ambulatory Visit | Attending: Nurse Practitioner | Admitting: Nurse Practitioner

## 2016-12-29 DIAGNOSIS — R921 Mammographic calcification found on diagnostic imaging of breast: Secondary | ICD-10-CM

## 2016-12-29 DIAGNOSIS — Z1231 Encounter for screening mammogram for malignant neoplasm of breast: Secondary | ICD-10-CM

## 2016-12-29 IMAGING — MG MM DIGITAL DIAGNOSTIC BILAT W/ TOMO W/ CAD
8 of 16 series · 8 of 32 positions shown · non-contrast
Comparison: Previous exam(s).

CLINICAL DATA: Patient was to have short-term follow-up for left
breast calcifications, evaluated in [DATE]. This is the first
follow-up exam.Patient has no current complaints.

EXAM:
2D DIGITAL DIAGNOSTIC BILATERAL MAMMOGRAM WITH CAD AND ADJUNCT TOMO

[L CC (1 of 2)]
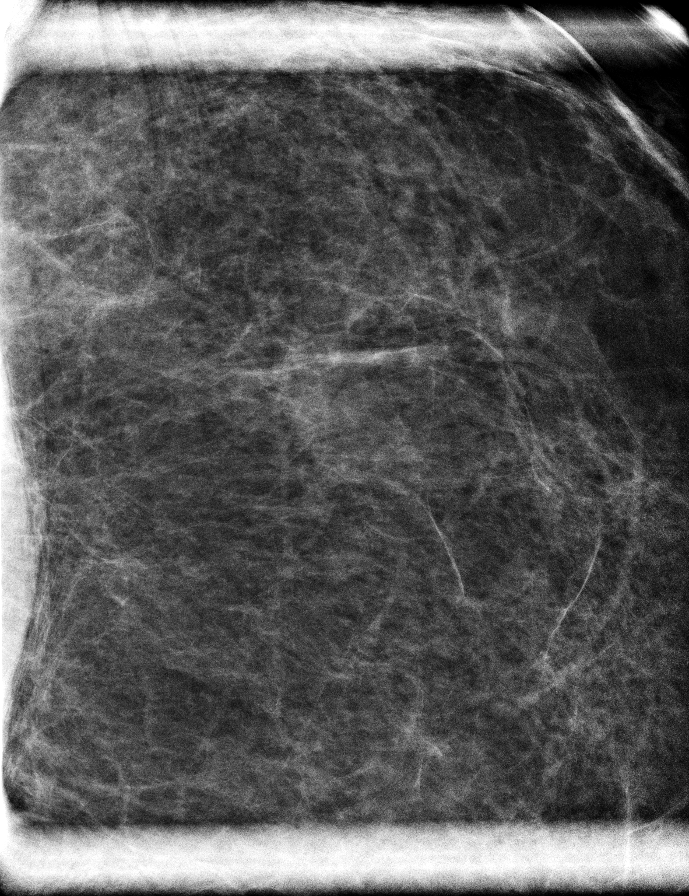

[L ML (1 of 2)]
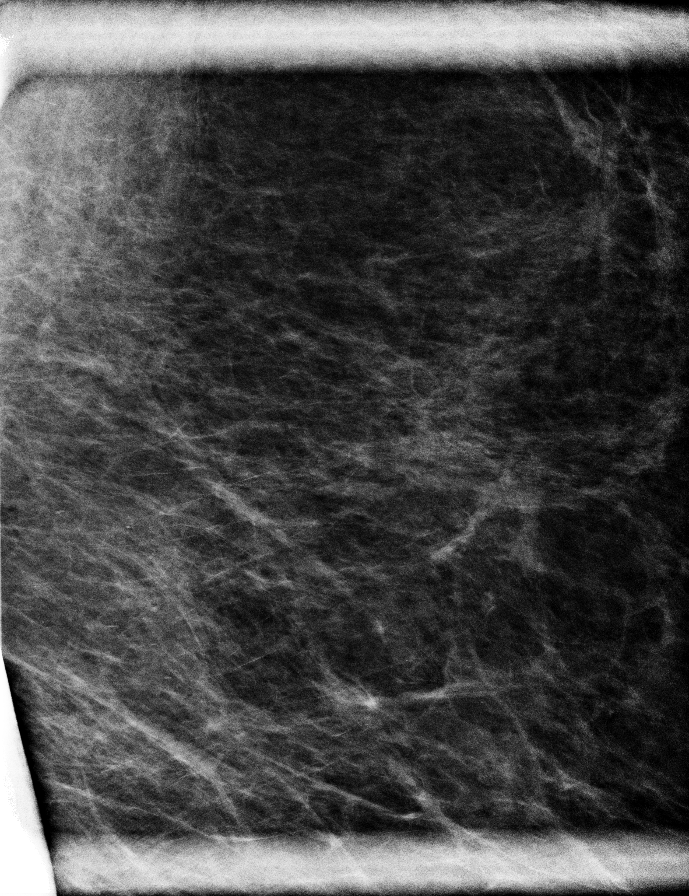

[L CC (2 of 2)]
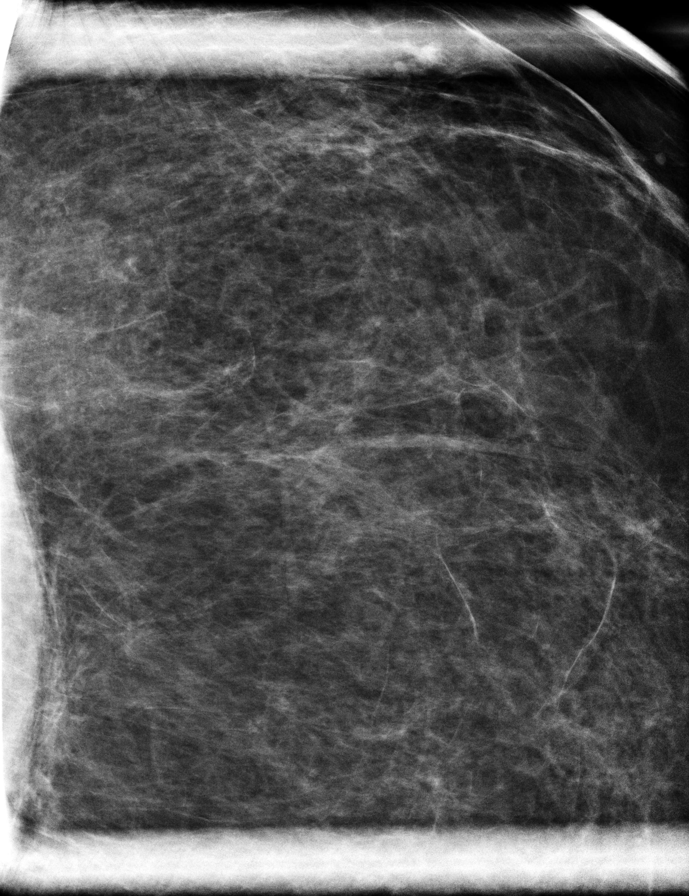

[L ML (2 of 2)]
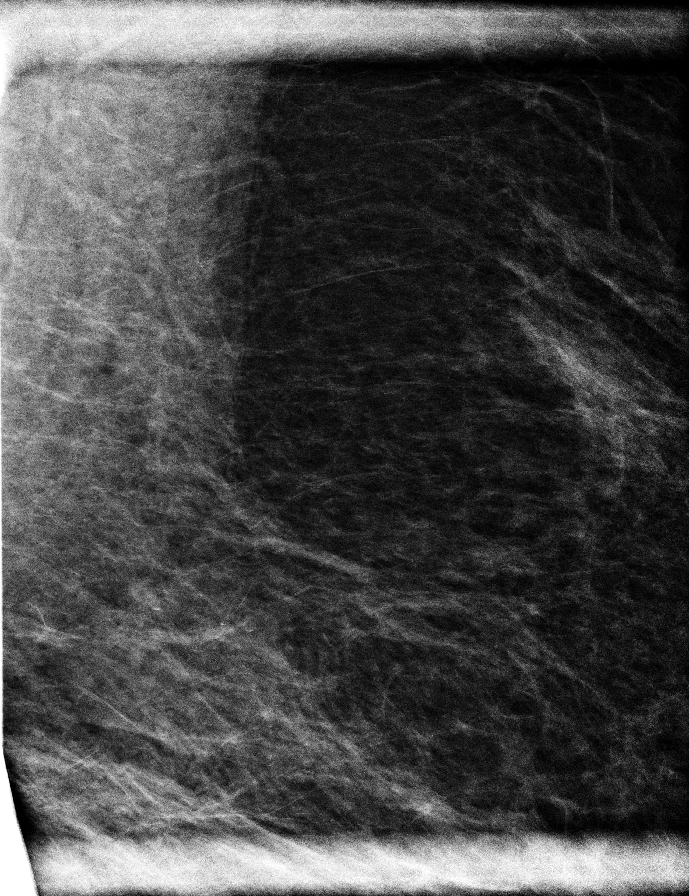

[L CC synth-2D]
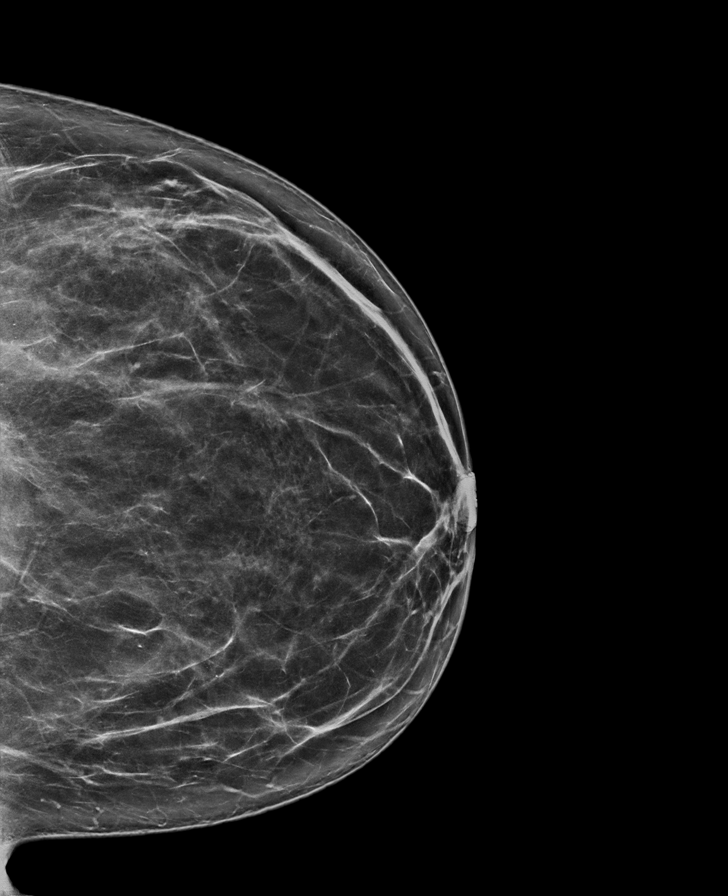

[R MLO]
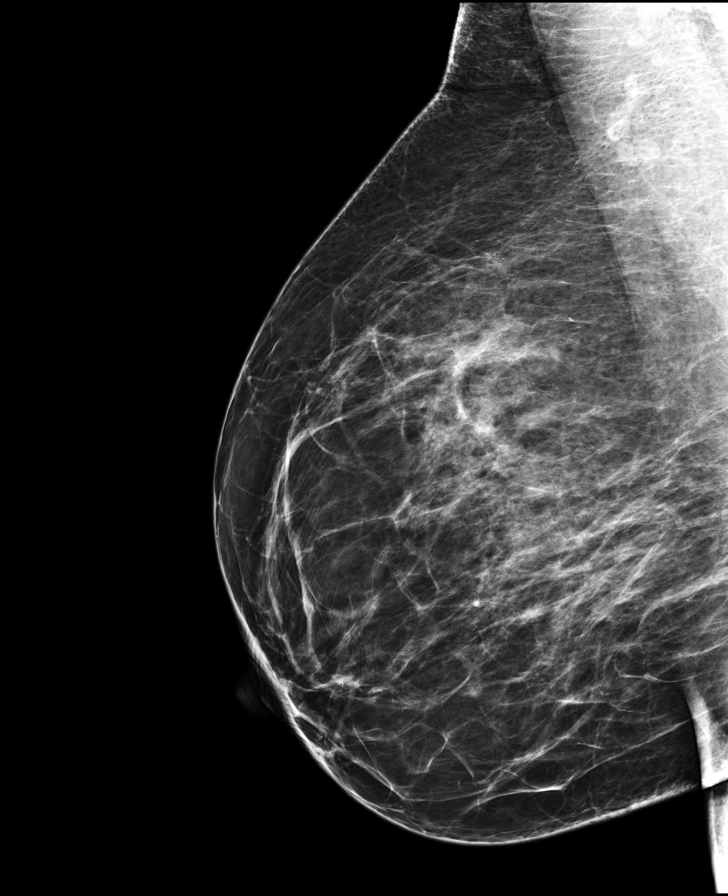

[R CC]
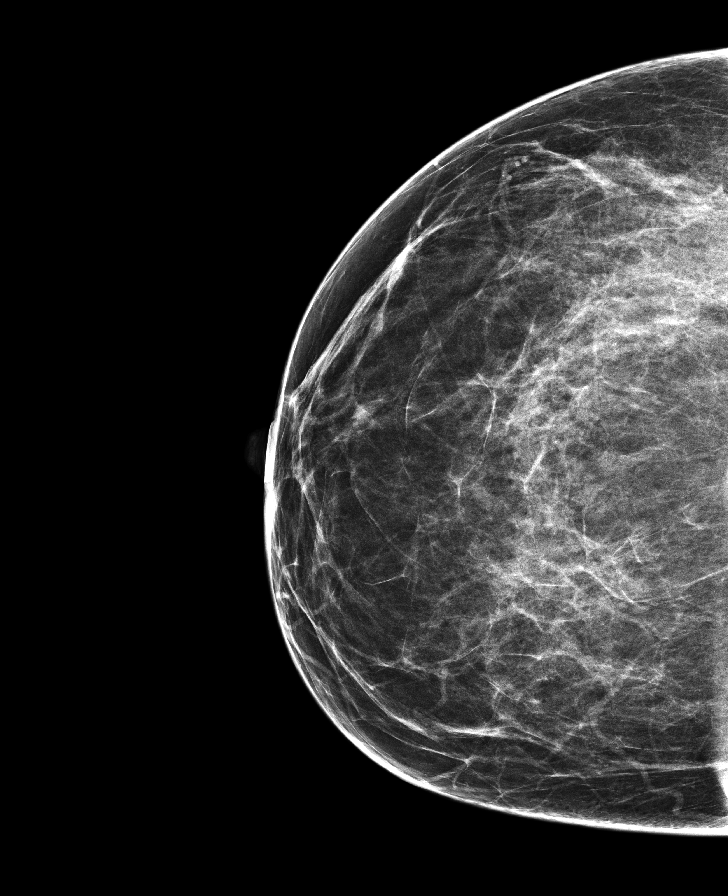

[L MLO]
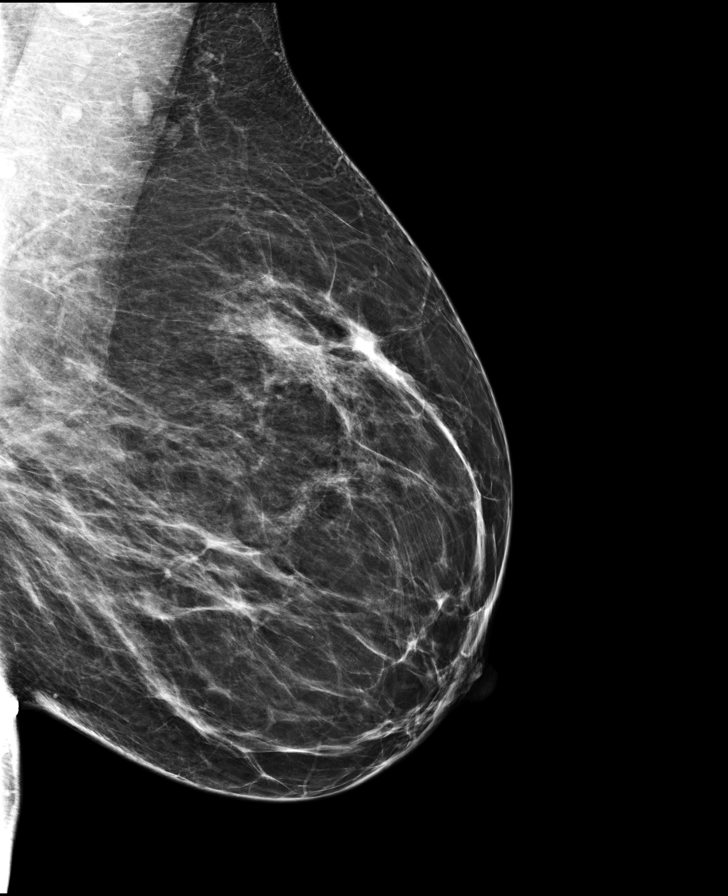

[8 of 32 positions shown; findings below may reference images not displayed]

ACR Breast Density Category b: There are scattered areas of
fibroglandular density.
FINDINGS: The group of calcifications in the posterior, lateral aspect of the
left breast are without change from the prior exam.

There are no new calcifications. There are no discrete masses or
areas of architectural distortion.

Mammographic images were processed with CAD.
IMPRESSION: 1. Probably benign calcifications in the left breast, stable 1 year.
2. No new abnormalities.

RECOMMENDATION:
Diagnostic mammography with left breast magnification views in 1
year to document 2 years of stability for the left breast
calcifications.

I have discussed the findings and recommendations with the patient.
Results were also provided in writing at the conclusion of the
visit. If applicable, a reminder letter will be sent to the patient
regarding the next appointment.

BI-RADS CATEGORY  3: Probably benign.

## 2017-01-03 ENCOUNTER — Other Ambulatory Visit: Payer: Self-pay | Admitting: Nurse Practitioner

## 2017-01-03 DIAGNOSIS — R921 Mammographic calcification found on diagnostic imaging of breast: Secondary | ICD-10-CM

## 2017-01-07 DIAGNOSIS — F419 Anxiety disorder, unspecified: Secondary | ICD-10-CM | POA: Insufficient documentation

## 2017-08-08 DIAGNOSIS — E669 Obesity, unspecified: Secondary | ICD-10-CM | POA: Insufficient documentation

## 2018-01-02 ENCOUNTER — Other Ambulatory Visit: Payer: BLUE CROSS/BLUE SHIELD

## 2018-01-19 ENCOUNTER — Other Ambulatory Visit: Payer: BLUE CROSS/BLUE SHIELD

## 2018-02-10 ENCOUNTER — Other Ambulatory Visit: Payer: BLUE CROSS/BLUE SHIELD

## 2018-02-24 ENCOUNTER — Ambulatory Visit
Admission: RE | Admit: 2018-02-24 | Discharge: 2018-02-24 | Disposition: A | Discharge status: Home / Self Care | Payer: BLUE CROSS/BLUE SHIELD | Source: Ambulatory Visit | Attending: Nurse Practitioner | Admitting: Nurse Practitioner

## 2018-02-24 DIAGNOSIS — R921 Mammographic calcification found on diagnostic imaging of breast: Secondary | ICD-10-CM | POA: Diagnosis not present

## 2018-02-24 LAB — HM HIV SCREENING LAB: HM HIV Screening: NEGATIVE

## 2018-02-24 IMAGING — MG MM DIGITAL DIAGNOSTIC BILAT W/ TOMO W/ CAD
6 of 10 series · 6 of 26 positions shown · non-contrast
Comparison: Previous exam(s).

CLINICAL DATA: Short-term interval follow-up of probable benign
calcifications in the left breast originally seen on screening
mammogram dated [DATE].

EXAM:
DIGITAL DIAGNOSTIC BILATERAL MAMMOGRAM WITH CAD AND TOMO

[L ML]
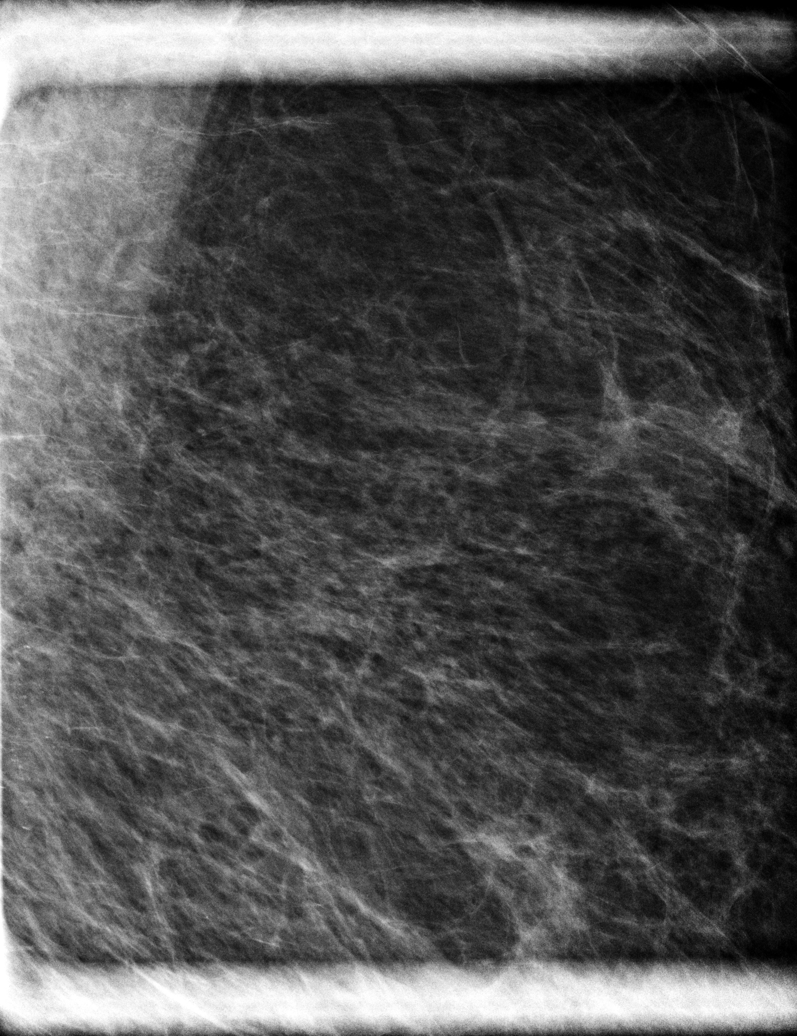

[L CC]
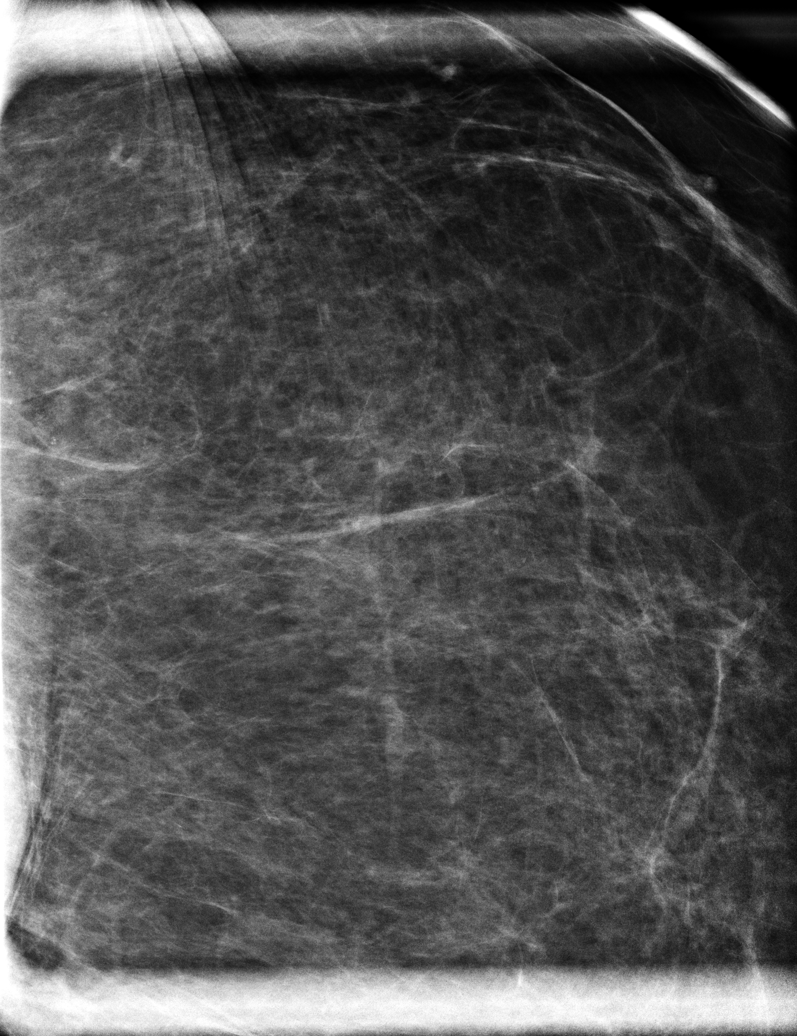

[R CC synth-2D]
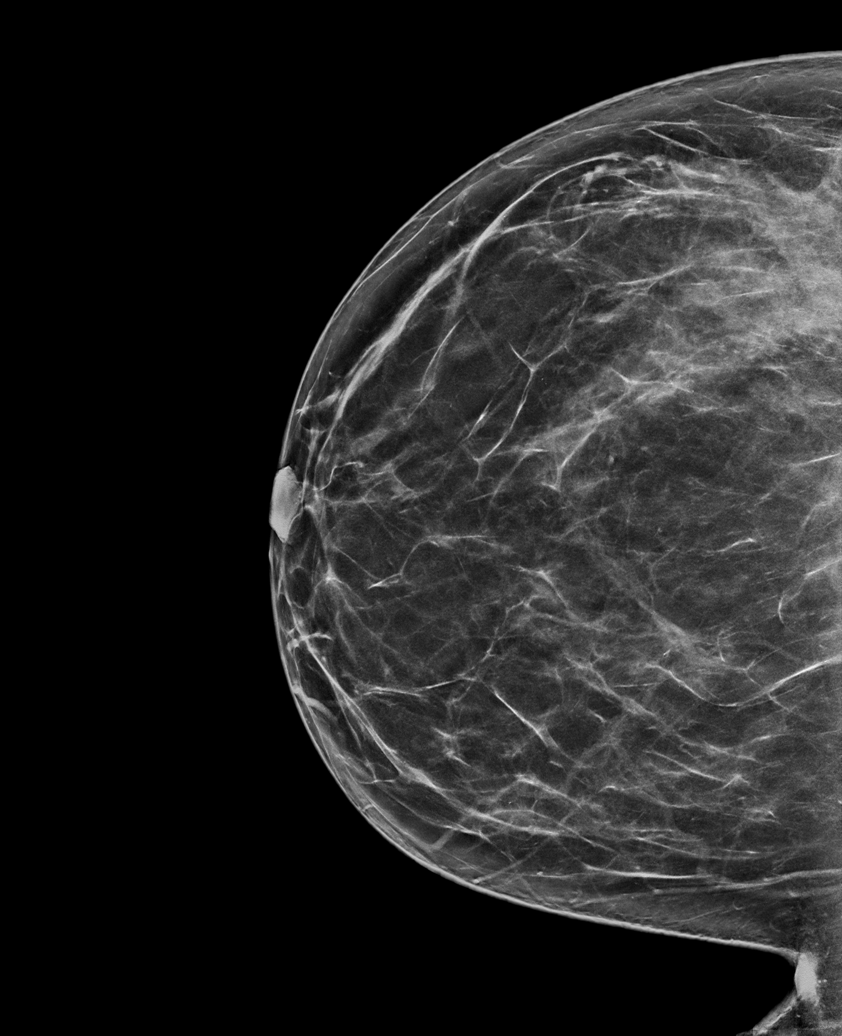

[R MLO synth-2D]
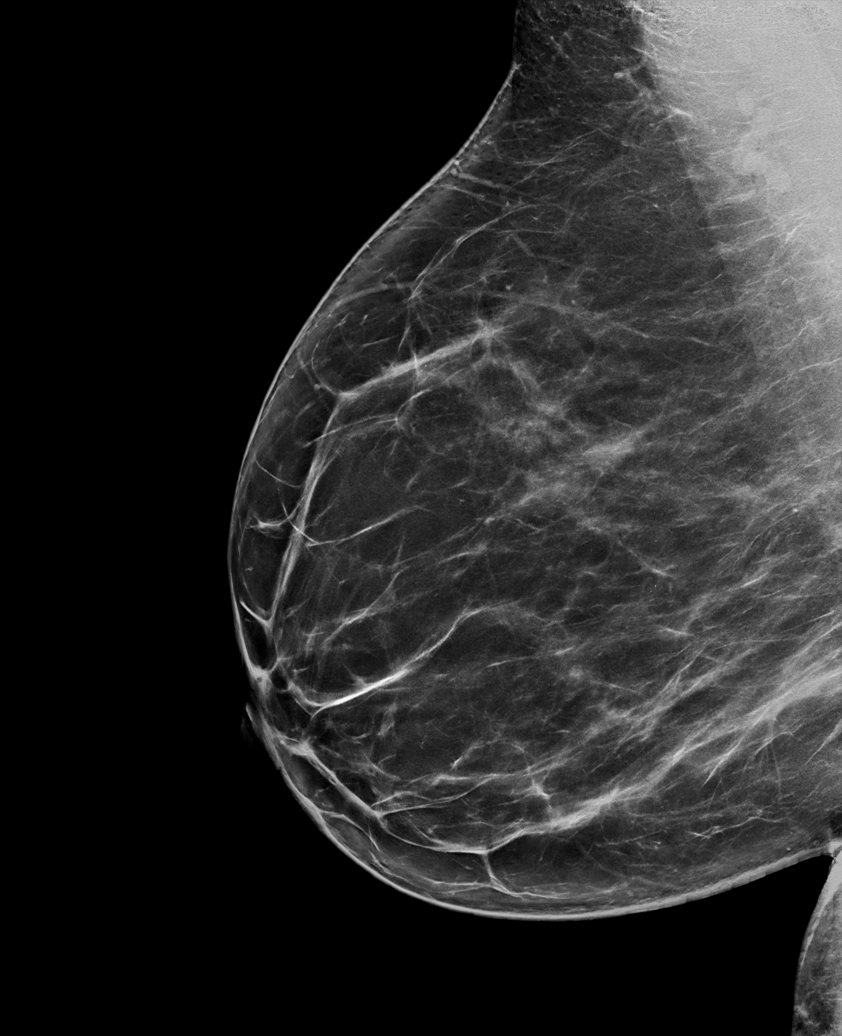

[L MLO synth-2D]
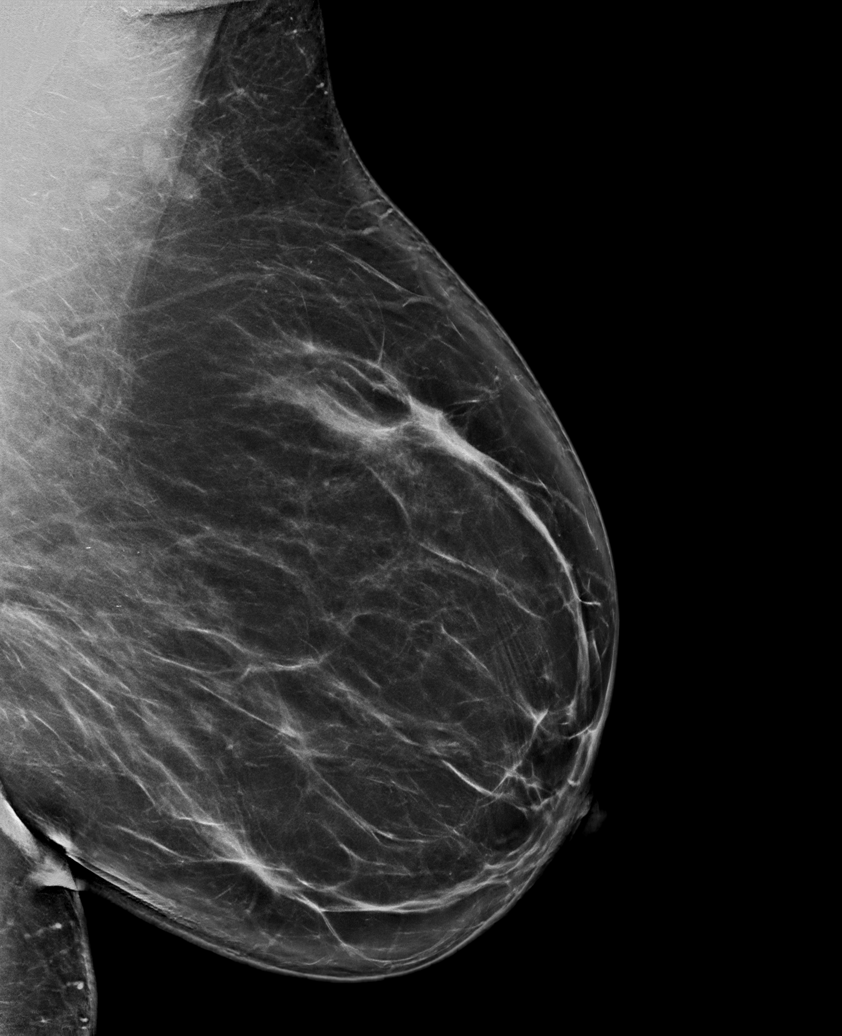

[L CC synth-2D]
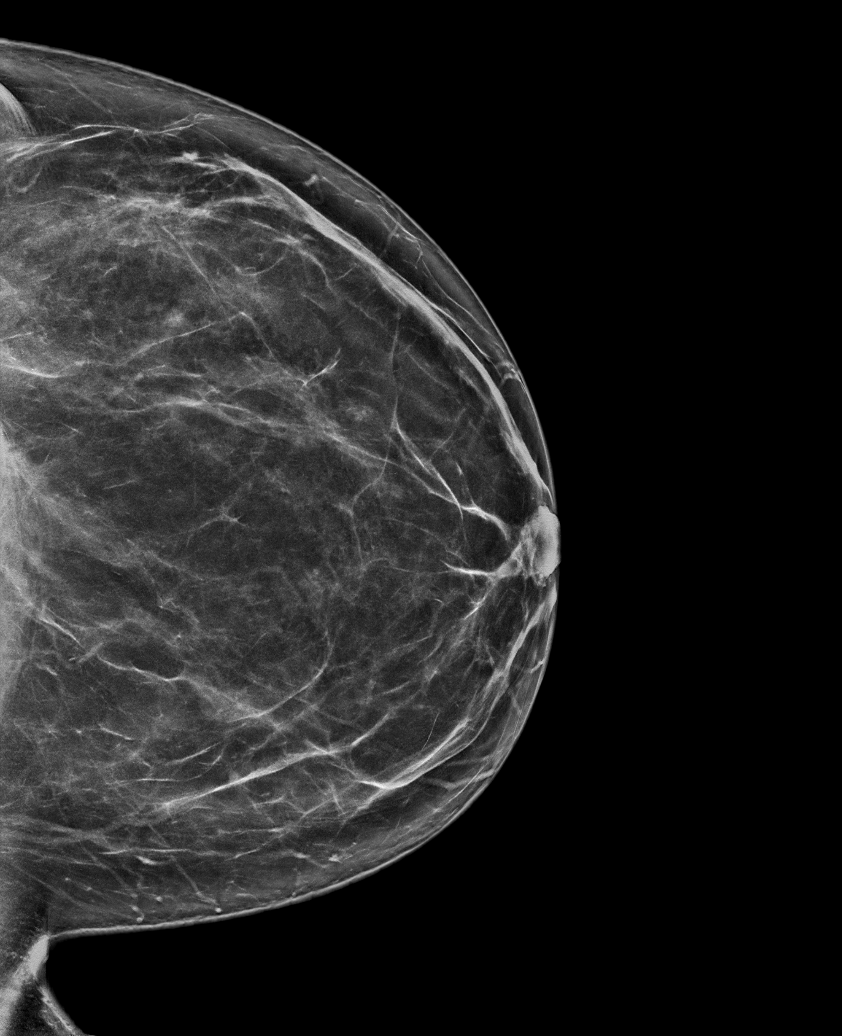

[6 of 26 positions shown; findings below may reference images not displayed]

ACR Breast Density Category b: There are scattered areas of
fibroglandular density.
FINDINGS: Calcifications far posteriorly in the lateral aspect of left breast
are stable compared to the prior exam dated [DATE]. Some of
these have a differential appearance and are thought to be milk of
calcium. No suspicious mass or malignant type microcalcifications
identified in either breast.

Mammographic images were processed with CAD.
IMPRESSION: No evidence of malignancy in either breast.

RECOMMENDATION:
Bilateral screening mammogram in 1 year is recommended.

I have discussed the findings and recommendations with the patient.
Results were also provided in writing at the conclusion of the
visit. If applicable, a reminder letter will be sent to the patient
regarding the next appointment.

BI-RADS CATEGORY  2: Benign.

## 2018-08-31 ENCOUNTER — Other Ambulatory Visit: Payer: Self-pay

## 2018-08-31 DIAGNOSIS — Z20822 Contact with and (suspected) exposure to covid-19: Secondary | ICD-10-CM

## 2018-09-03 LAB — SPECIMEN STATUS REPORT

## 2018-09-03 LAB — NOVEL CORONAVIRUS, NAA: SARS-CoV-2, NAA: NOT DETECTED

## 2019-01-22 DIAGNOSIS — F419 Anxiety disorder, unspecified: Secondary | ICD-10-CM

## 2019-01-23 ENCOUNTER — Encounter: Payer: Self-pay | Admitting: Family Medicine

## 2019-01-23 ENCOUNTER — Other Ambulatory Visit: Payer: Self-pay

## 2019-01-23 ENCOUNTER — Ambulatory Visit: Payer: Self-pay | Admitting: Family Medicine

## 2019-01-23 DIAGNOSIS — Z113 Encounter for screening for infections with a predominantly sexual mode of transmission: Secondary | ICD-10-CM

## 2019-01-23 LAB — WET PREP FOR TRICH, YEAST, CLUE
Trichomonas Exam: NEGATIVE
Yeast Exam: NEGATIVE

## 2019-01-23 NOTE — Progress Notes (Signed)
   [facility] Department STI clinic/screening visit  Subjective:  Leslie Meadows is a 48 y.o. female being seen today for an STI screening visit. The patient reports they do not have symptoms.    Patient has the following medical conditions:   Patient Active Problem List   Diagnosis Date Noted  . Obesity, unspecified 08/08/2017  . Anxiety 01/07/2017     Chief Complaint  Patient presents with  . SEXUALLY TRANSMITTED DISEASE    STD screening including bloodwork    HPI  Patient reports that she is here for STD screening.  States that the last time she and partner has sex the condom fell off.  She want to be sure there is no infection.   See flowsheet for further details and programmatic requirements.    The following portions of the patient's history were reviewed and updated as appropriate: allergies, current medications, past medical history, past social history, past surgical history and problem list.  Objective:  There were no vitals filed for this visit.  Physical Exam Constitutional:      Appearance: She is obese.  Abdominal:     Hernia: There is no hernia in the left inguinal area or right inguinal area.  Genitourinary:    General: Normal vulva.     Pubic Area: No rash or pubic lice.      Labia:        Right: No rash or tenderness.        Left: No rash or tenderness.      Urethra: No prolapse.     Vagina: No vaginal discharge.     Adnexa: Right adnexa normal and left adnexa normal.     Comments: hysterectomy Musculoskeletal:     Cervical back: Neck supple. No tenderness.  Lymphadenopathy:     Cervical: No cervical adenopathy.     Lower Body: No right inguinal adenopathy. No left inguinal adenopathy.  Neurological:     Mental Status: She is alert.    Assessment and Plan:  [name] is a 48 y.o. female presenting to the [facility] Department for STI screening  1. Screening examination for venereal disease - WET PREP FOR [place],  YEAST, CLUE - Chlamydia/Gonorrhea [facility] - HIV [place] [facility] - Syphilis Serology, [facility]     No follow-ups on file.  No future appointments.  Hassell Done, FNP

## 2019-01-23 NOTE — Progress Notes (Signed)
Wet mount reviewed, no tx per standing order. Provider orders completed. 

## 2019-09-18 ENCOUNTER — Other Ambulatory Visit: Payer: Self-pay | Admitting: Nurse Practitioner

## 2019-09-18 DIAGNOSIS — M5410 Radiculopathy, site unspecified: Secondary | ICD-10-CM

## 2019-09-18 DIAGNOSIS — Z1231 Encounter for screening mammogram for malignant neoplasm of breast: Secondary | ICD-10-CM

## 2019-11-05 ENCOUNTER — Other Ambulatory Visit: Payer: Self-pay

## 2019-11-05 ENCOUNTER — Ambulatory Visit
Admission: RE | Admit: 2019-11-05 | Discharge: 2019-11-05 | Disposition: A | Discharge status: Home / Self Care | Payer: BLUE CROSS/BLUE SHIELD | Source: Ambulatory Visit | Attending: Nurse Practitioner | Admitting: Nurse Practitioner

## 2019-11-05 DIAGNOSIS — Z1231 Encounter for screening mammogram for malignant neoplasm of breast: Secondary | ICD-10-CM | POA: Insufficient documentation

## 2019-11-05 IMAGING — MG DIGITAL SCREENING BILAT W/ TOMO W/ CAD
6 of 10 series · 6 of 30 positions shown · non-contrast
Comparison: Previous exam(s).

CLINICAL DATA: Screening.

EXAM:
DIGITAL SCREENING BILATERAL MAMMOGRAM WITH TOMO AND CAD

[R CC synth-2D]
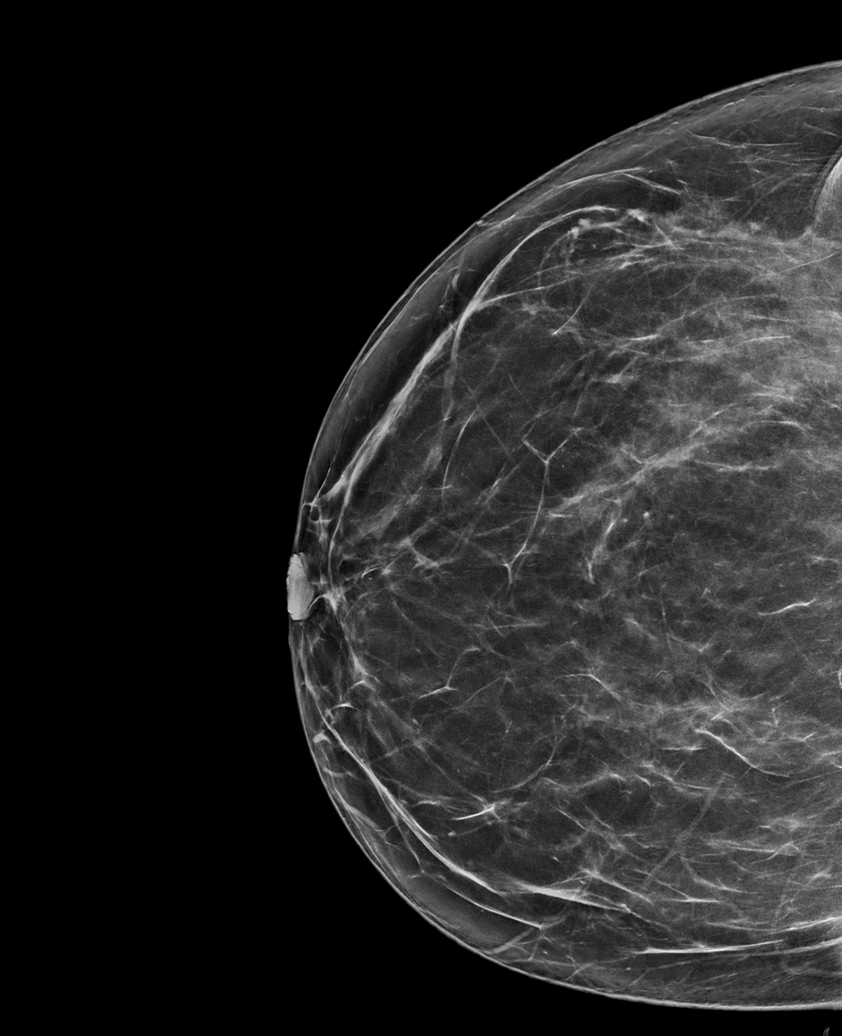

[L MLO synth-2D (1 of 2)]
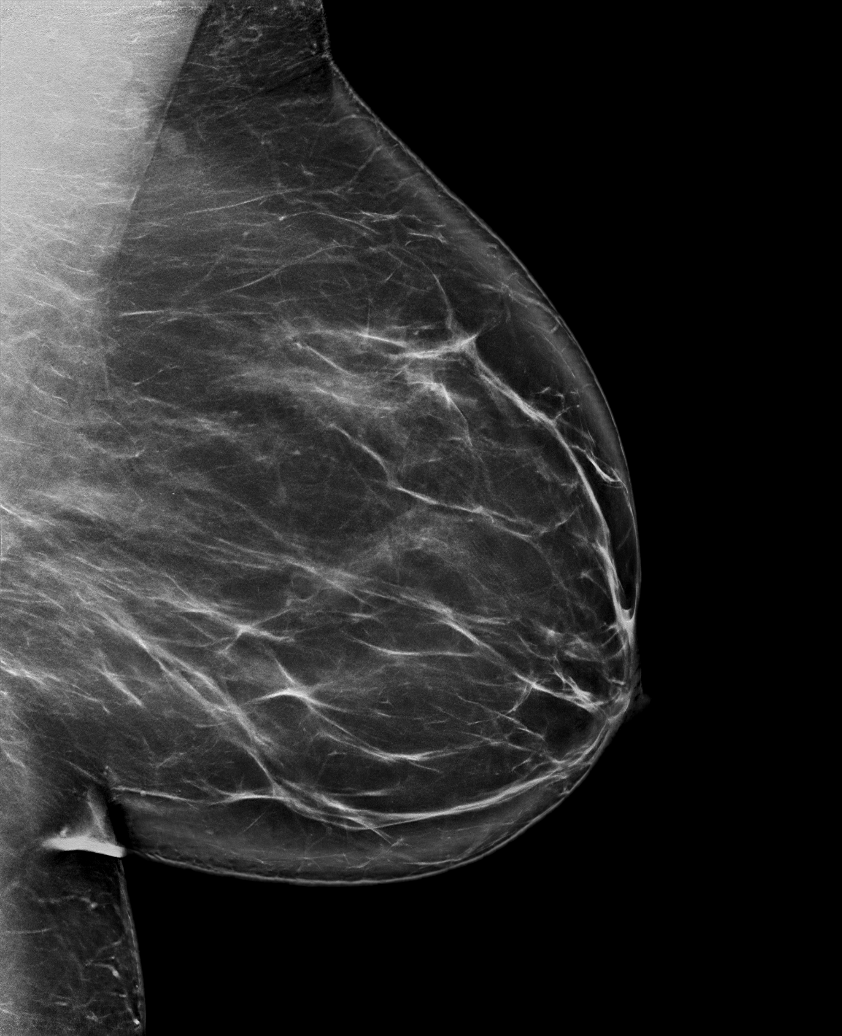

[R MLO synth-2D]
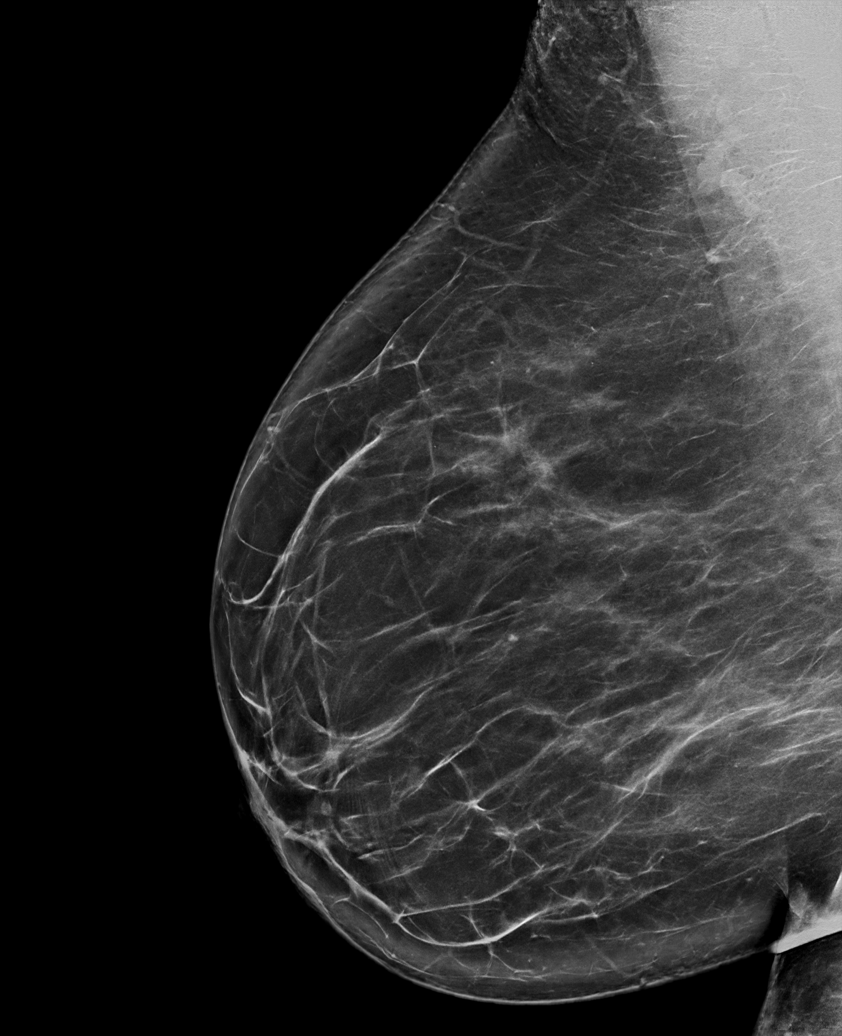

[L MLO synth-2D (2 of 2)]
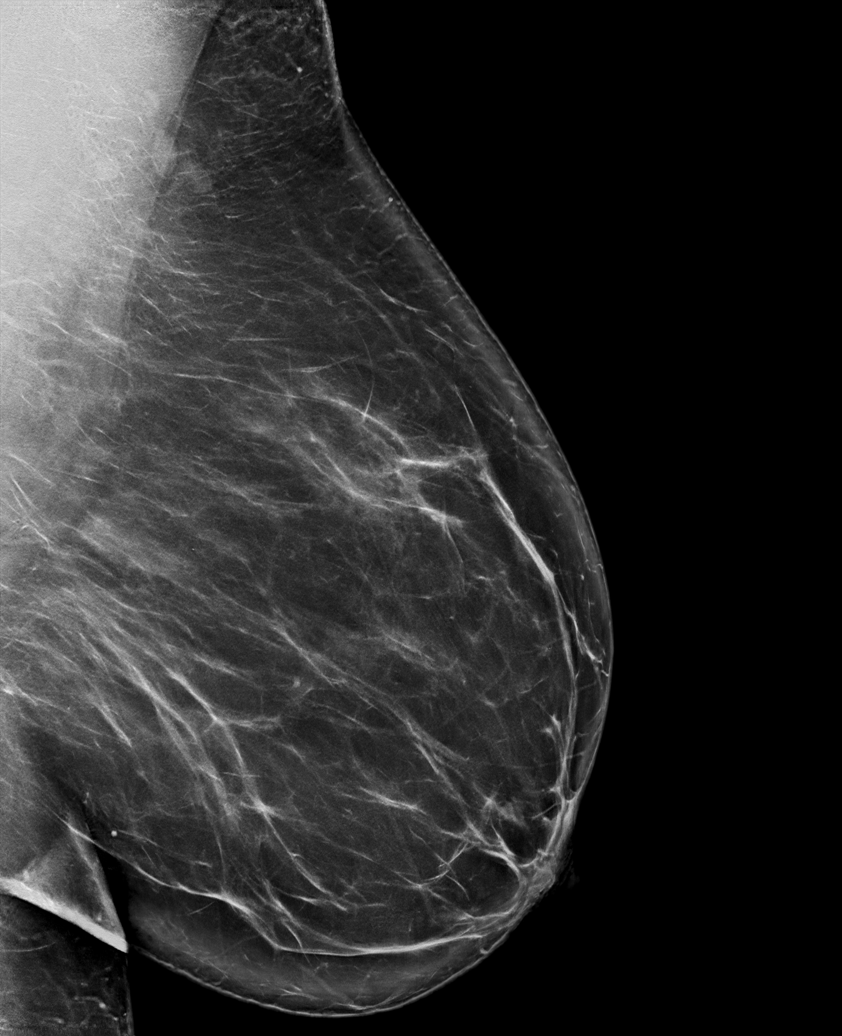

[L CC synth-2D]
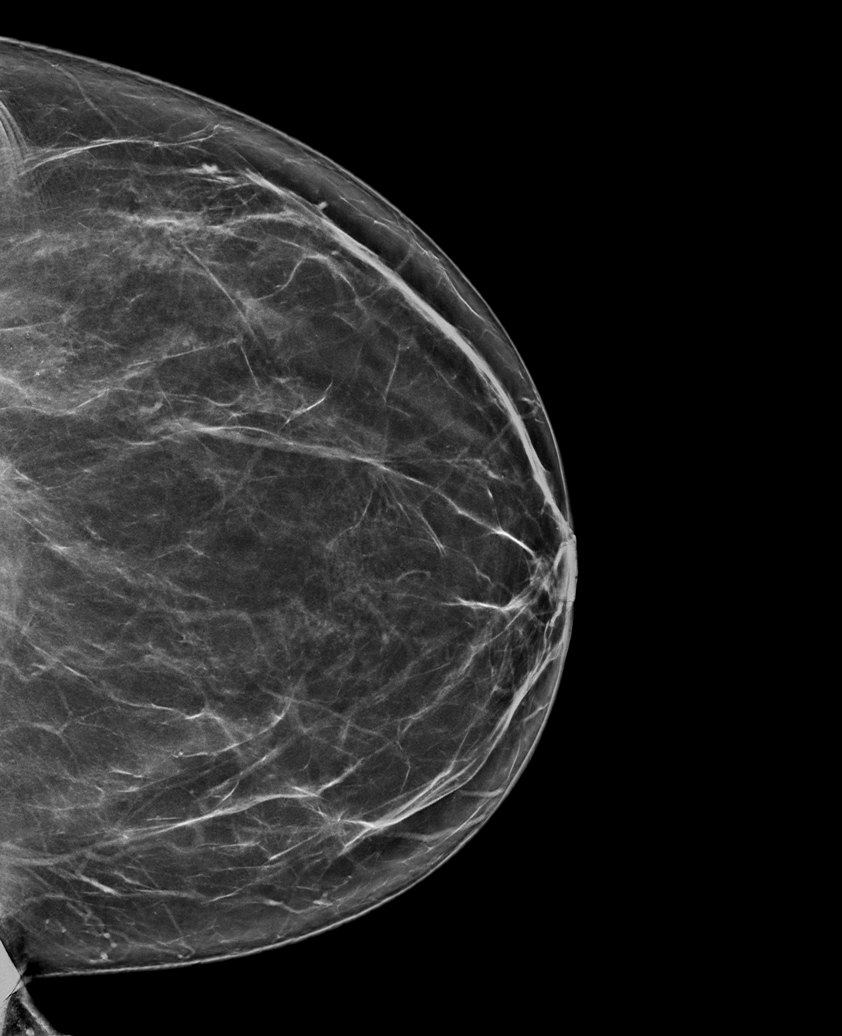

[L MLO tomo · tomo slice 53/104.0]
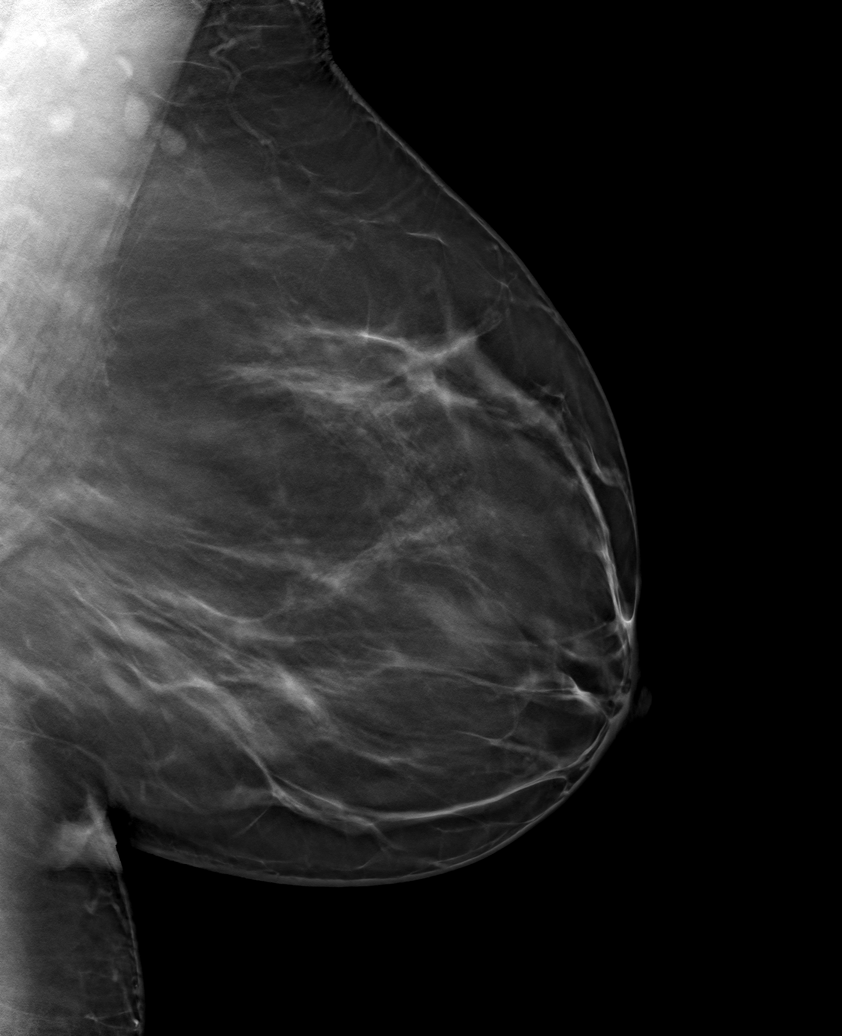

[6 of 30 positions shown; findings below may reference images not displayed]

ACR Breast Density Category b: There are scattered areas of
fibroglandular density.
FINDINGS: There are no findings suspicious for malignancy. Images were
processed with CAD.
IMPRESSION: No mammographic evidence of malignancy. A result letter of this
screening mammogram will be mailed directly to the patient.

RECOMMENDATION:
Screening mammogram in one year. (Code:[TQ])

BI-RADS CATEGORY  1: Negative.

## 2020-04-22 ENCOUNTER — Other Ambulatory Visit: Payer: Self-pay | Admitting: Nurse Practitioner

## 2020-04-22 DIAGNOSIS — M5410 Radiculopathy, site unspecified: Secondary | ICD-10-CM

## 2020-05-01 ENCOUNTER — Other Ambulatory Visit: Payer: Self-pay

## 2020-05-01 ENCOUNTER — Ambulatory Visit
Admission: RE | Admit: 2020-05-01 | Discharge: 2020-05-01 | Disposition: A | Discharge status: Home / Self Care | Payer: BC Managed Care – PPO | Source: Ambulatory Visit | Attending: Nurse Practitioner | Admitting: Nurse Practitioner

## 2020-05-01 DIAGNOSIS — M5410 Radiculopathy, site unspecified: Secondary | ICD-10-CM | POA: Insufficient documentation

## 2020-05-01 IMAGING — MR MR LUMBAR SPINE W/O CM
5 series · 31 of 48 positions shown · non-contrast
Comparison: None.

CLINICAL DATA: Prior fall. Lower back pain. Right greater than left
numbness.

EXAM:
MRI LUMBAR SPINE WITHOUT CONTRAST
TECHNIQUE: Multiplanar, multisequence MR imaging of the lumbar spine was
performed. No intravenous contrast was administered.

[Series 5: T2 · sagittal · 4.0mm · 0.81mm/px · 6 of 17 slices shown (1 of 2)]
[im 1/17]
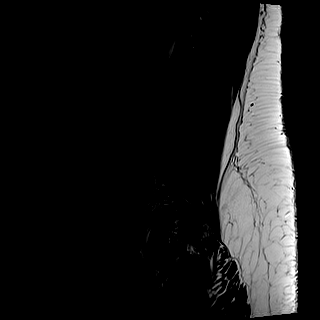
[im 4/17]
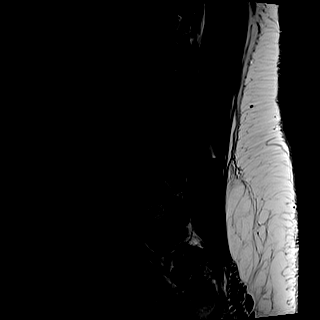
[im 7/17]
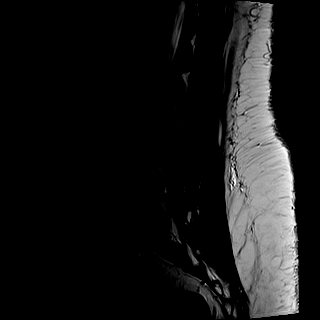
[im 10/17]
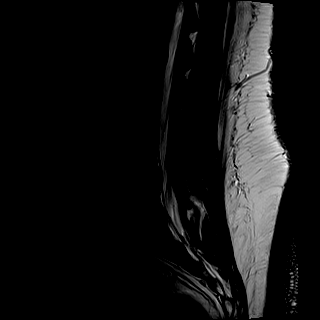
[im 13/17]
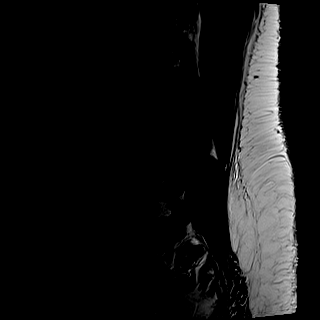
[im 17/17]
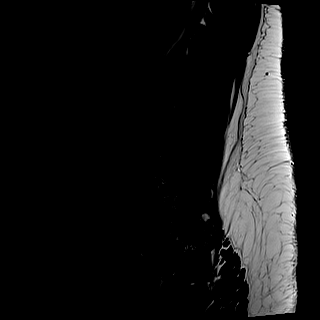

[Series 6: T1 · sagittal · 4.0mm · 0.81mm/px · 7 of 17 slices shown (1 of 2)]
[im 1/17]
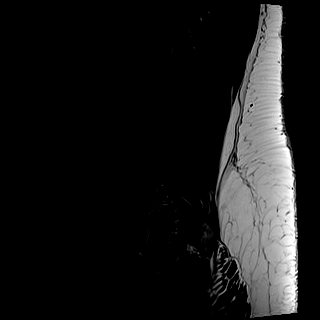
[im 3/17]
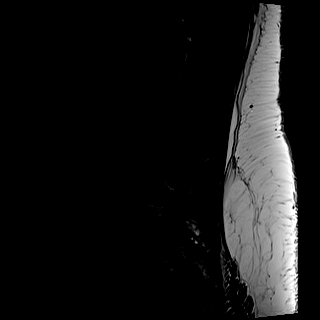
[im 6/17]
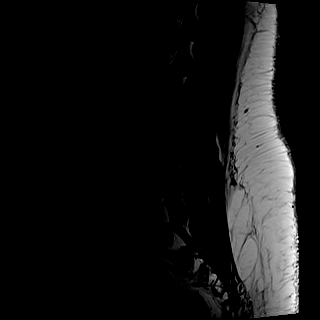
[im 9/17]
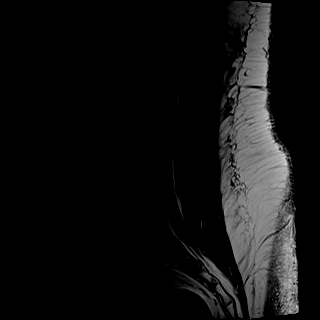
[im 11/17]
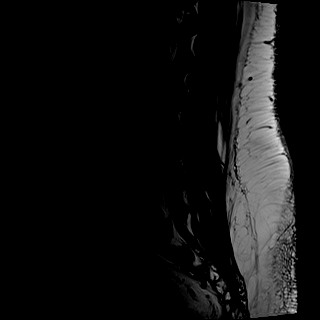
[im 14/17]
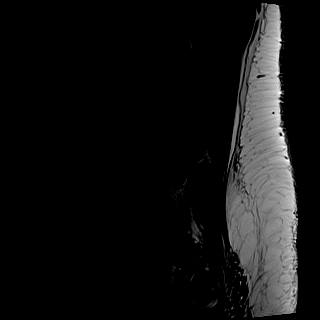
[im 17/17]
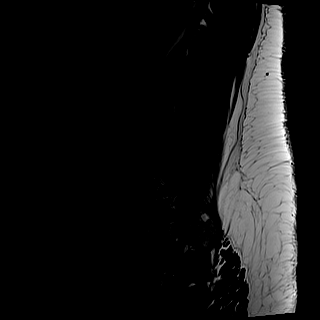

[Series 7: STIR · sagittal · 4.0mm · 0.41mm/px · 2 of 17 slices shown]
[im 1/17]
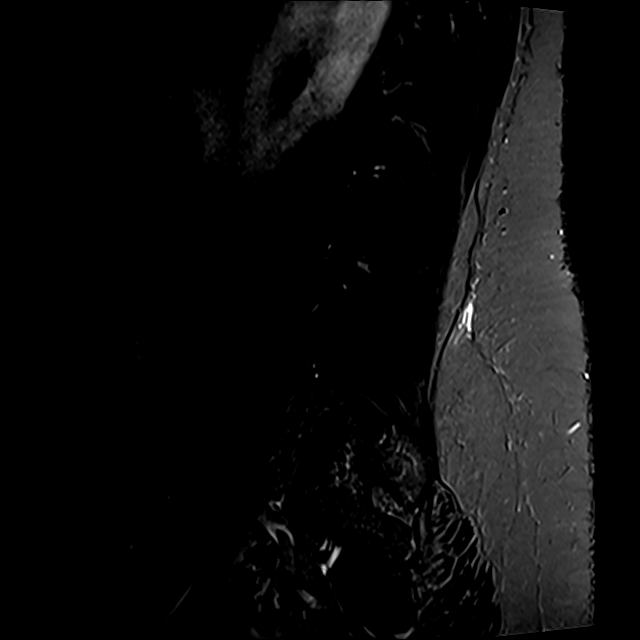
[im 3/17]
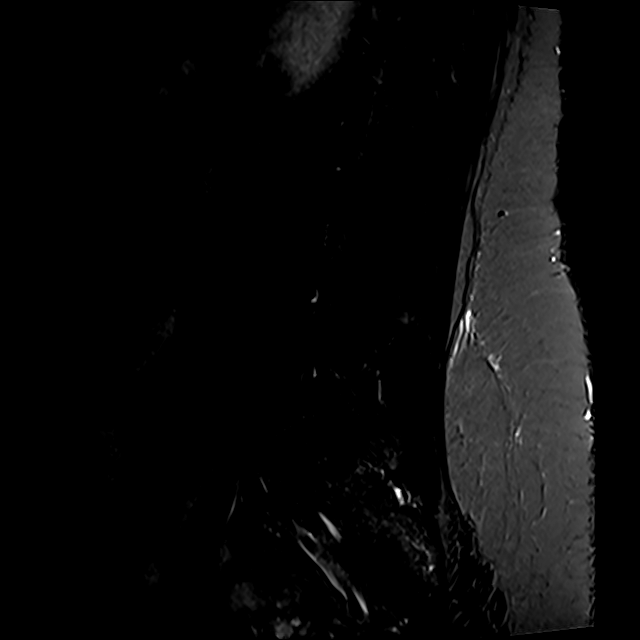

[Series 8: T2 · axial · 4.0mm · 0.78mm/px · z∈[-26,+176]mm · 8 of 36 slices shown (2 of 2)]
[im 1/36]
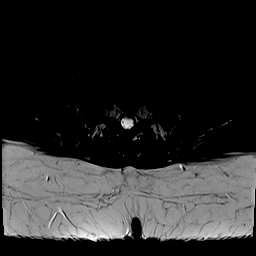
[im 6/36]
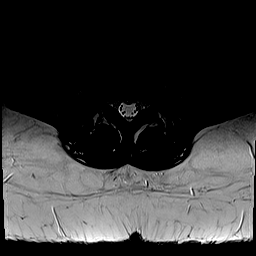
[im 11/36]
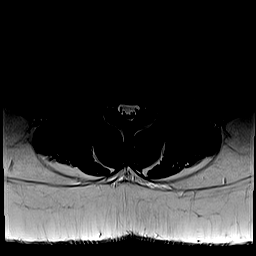
[im 17/36]
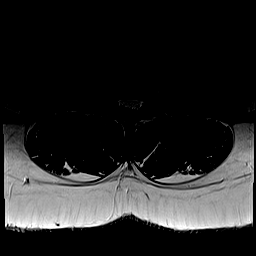
[im 19/36]
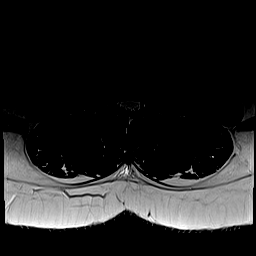
[im 25/36]
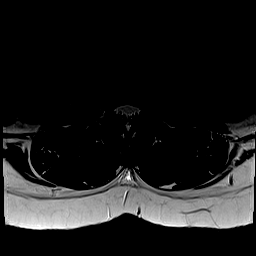
[im 30/36]
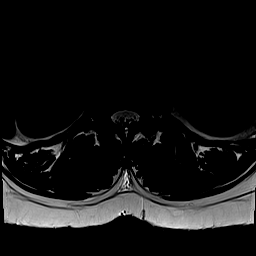
[im 36/36]
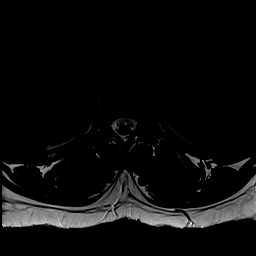

[Series 9: T1 · axial · 4.0mm · 0.39mm/px · z∈[-26,+176]mm · 8 of 36 slices shown (2 of 2)]
[im 1/36]
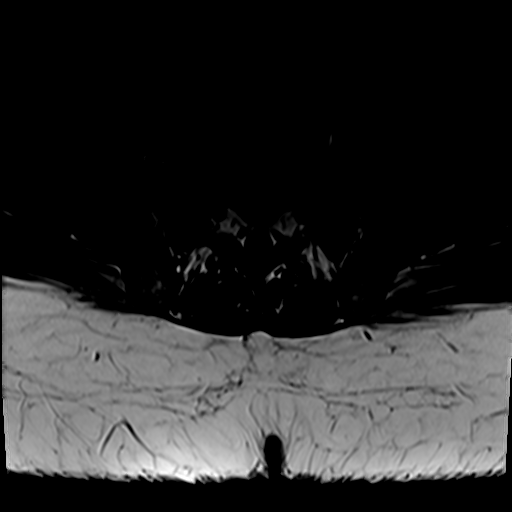
[im 6/36]
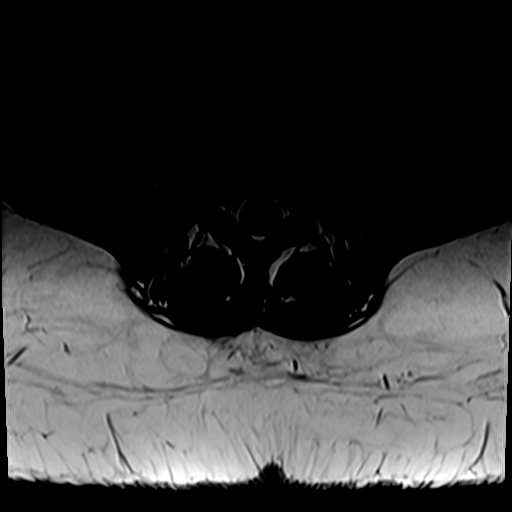
[im 11/36]
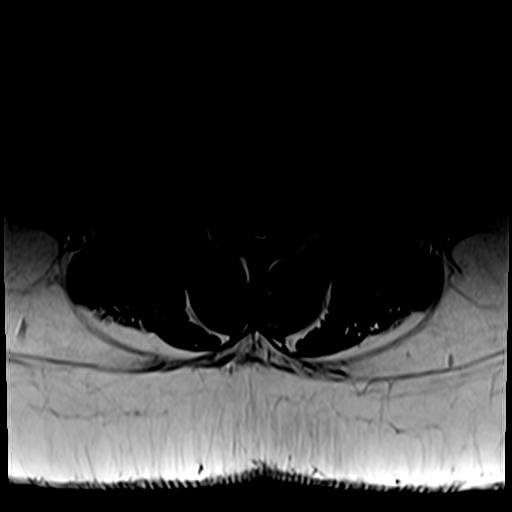
[im 17/36]
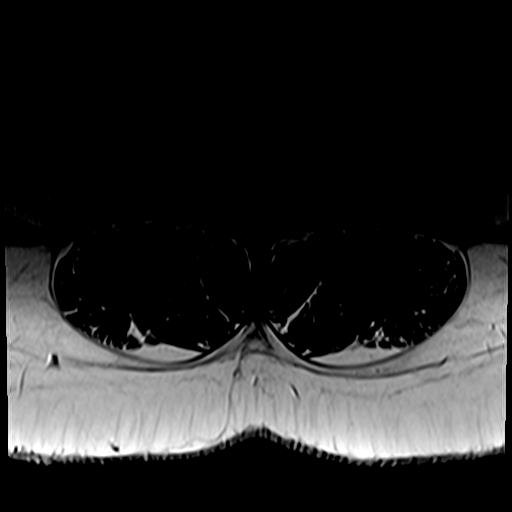
[im 19/36]
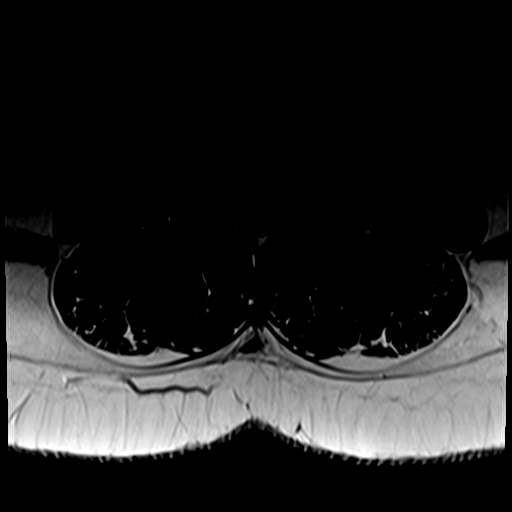
[im 25/36]
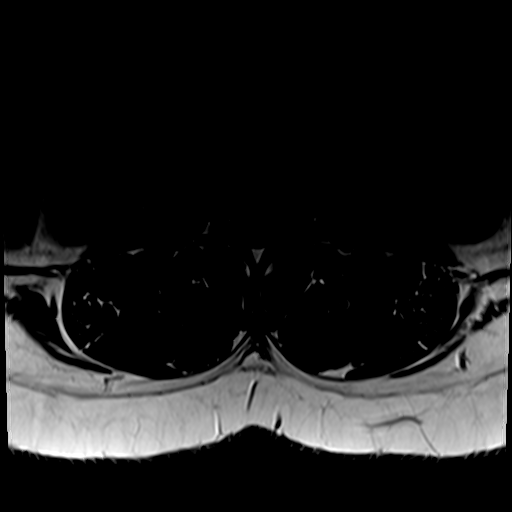
[im 30/36]
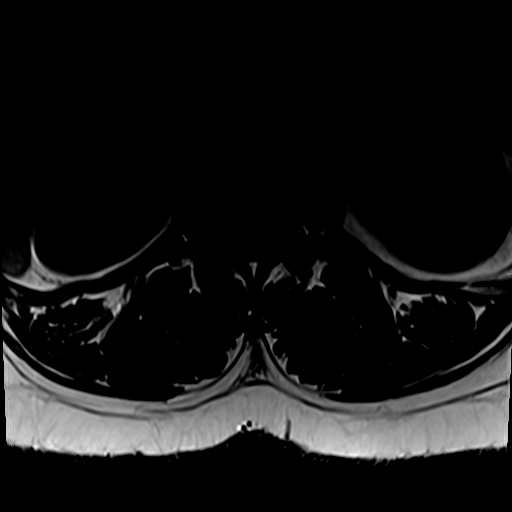
[im 36/36]
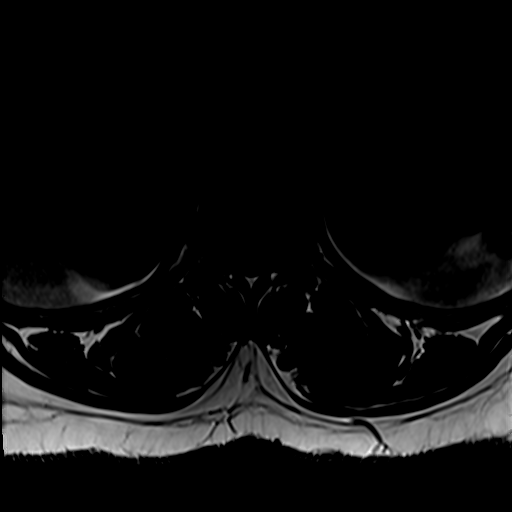

[31 of 48 positions shown; findings below may reference images not displayed]

FINDINGS: Segmentation:  Standard.

Alignment:  Straightening of lordosis.

Vertebrae: Small L5 hemangioma. No fracture or aggressive osseous
lesion.

Conus medullaris and cauda equina: Conus extends to the L1 level.
Conus and cauda equina appear normal.

Disc levels: Disc spaces are grossly preserved.

L1-2: No significant disc bulge. Patent spinal canal and neural
foramen.

L2-3: No significant disc bulge. Patent spinal canal and neural
foramen.

L3-4: Facet degenerative spurring. Shallow left foraminal
protrusion. Patent spinal canal and neural foramen.

L4-5: Minimal disc bulge and facet degenerative spurring. Patent
spinal canal and neural foramen.

L5-S1: Minimal disc bulge.  Patent spinal canal and neural foramen.

Paraspinal and other soft tissues: Negative.
IMPRESSION: Minimal to mild degenerative changes.

No significant spinal canal or neural foraminal narrowing.

## 2020-08-13 ENCOUNTER — Other Ambulatory Visit: Payer: Self-pay | Admitting: Nurse Practitioner

## 2020-08-13 DIAGNOSIS — Z1231 Encounter for screening mammogram for malignant neoplasm of breast: Secondary | ICD-10-CM

## 2020-09-08 ENCOUNTER — Other Ambulatory Visit: Payer: Self-pay | Admitting: *Deleted

## 2020-09-08 ENCOUNTER — Other Ambulatory Visit: Payer: Self-pay | Admitting: Acute Care

## 2020-09-08 ENCOUNTER — Other Ambulatory Visit: Payer: Self-pay | Admitting: Physical Medicine & Rehabilitation

## 2020-09-08 DIAGNOSIS — M542 Cervicalgia: Secondary | ICD-10-CM

## 2020-09-12 ENCOUNTER — Ambulatory Visit
Admission: RE | Admit: 2020-09-12 | Discharge: 2020-09-12 | Disposition: A | Discharge status: Home / Self Care | Payer: Worker's Compensation | Source: Ambulatory Visit | Attending: Physical Medicine & Rehabilitation | Admitting: Physical Medicine & Rehabilitation

## 2020-09-12 ENCOUNTER — Other Ambulatory Visit: Payer: Self-pay

## 2020-09-12 DIAGNOSIS — M542 Cervicalgia: Secondary | ICD-10-CM | POA: Diagnosis not present

## 2020-09-12 IMAGING — MR MR CERVICAL SPINE W/O CM
5 series · 39 of 48 positions shown · non-contrast
Comparison: None.

CLINICAL DATA: Numbness and tingling in the left hand

EXAM:
MRI CERVICAL SPINE WITHOUT CONTRAST
TECHNIQUE: Multiplanar, multisequence MR imaging of the cervical spine was
performed. No intravenous contrast was administered.

[Series 5: T2 · sagittal · 3.0mm · 0.62mm/px · 8 of 15 slices shown (1 of 2)]
[im 1/15]
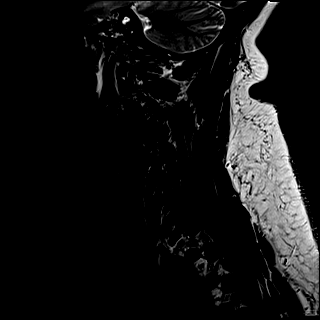
[im 3/15]
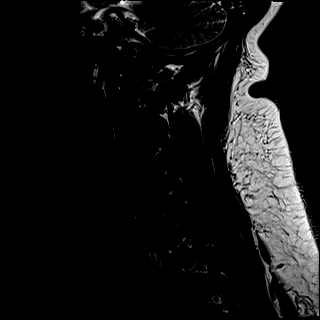
[im 5/15]
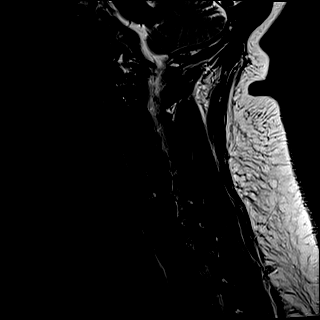
[im 7/15]
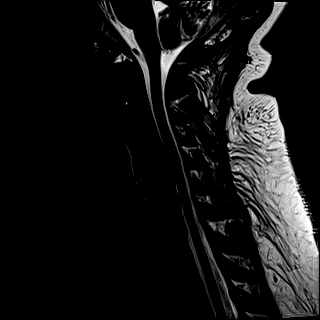
[im 9/15]
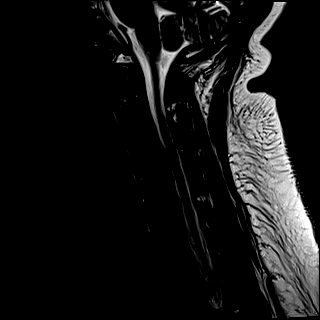
[im 11/15]
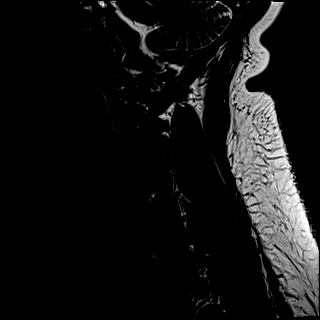
[im 13/15]
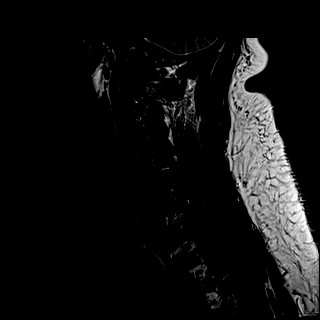
[im 15/15]
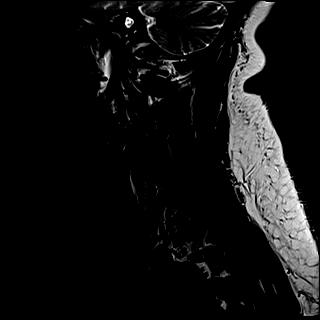

[Series 6: FLAIR · sagittal · 3.0mm · 0.78mm/px · 7 of 15 slices shown]
[im 1/15]
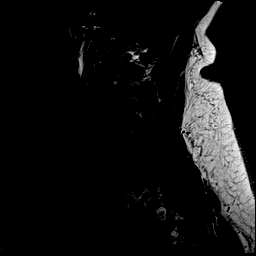
[im 3/15]
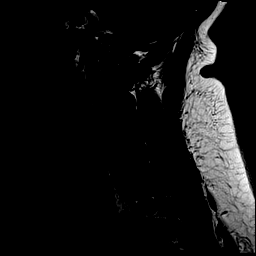
[im 5/15]
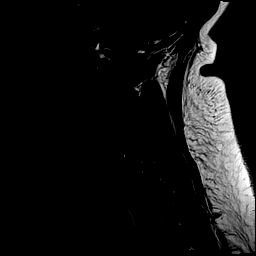
[im 8/15]
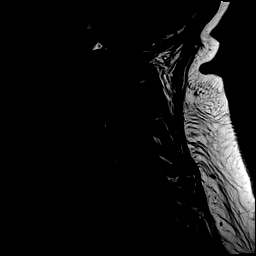
[im 10/15]
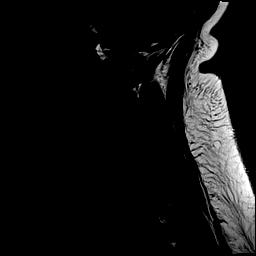
[im 12/15]
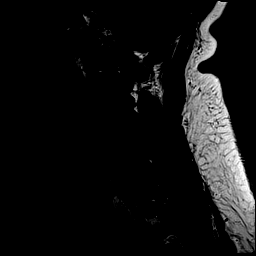
[im 15/15]
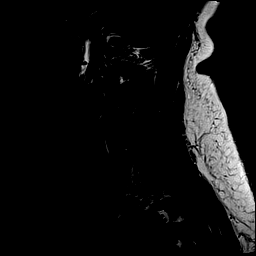

[Series 7: STIR · sagittal · 3.0mm · 0.62mm/px · 7 of 15 slices shown]
[im 1/15]
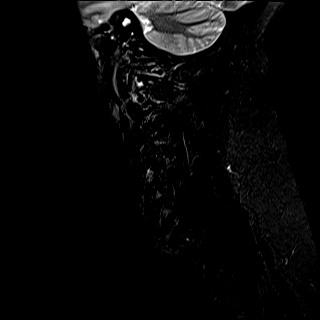
[im 3/15]
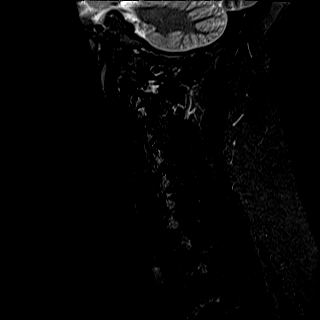
[im 5/15]
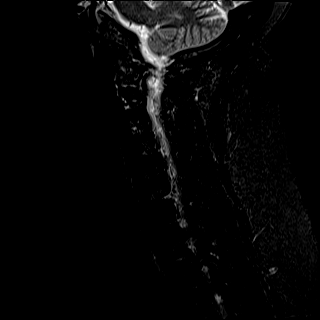
[im 8/15]
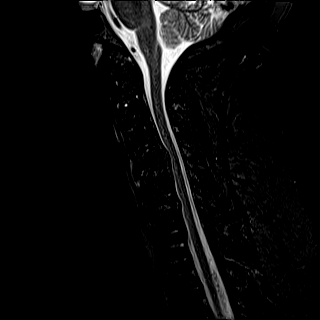
[im 10/15]
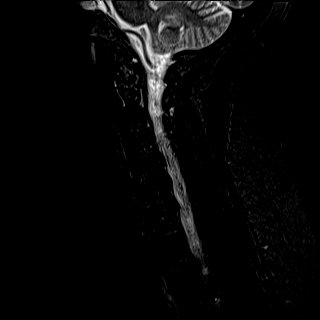
[im 12/15]
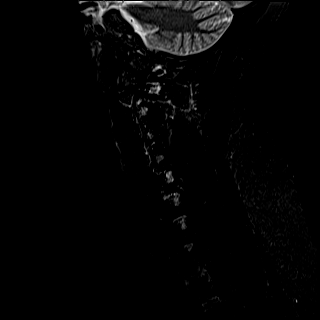
[im 15/15]
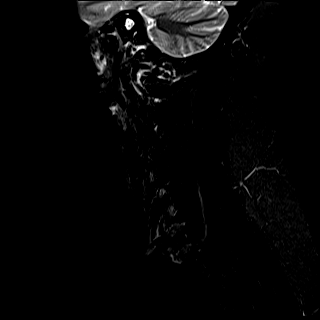

[Series 8: T2 · axial · 3.0mm · 0.70mm/px · z∈[-117,-25]mm · 9 of 28 slices shown (2 of 2)]
[im 1/28]
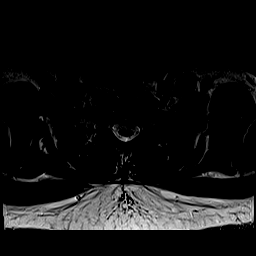
[im 5/28]
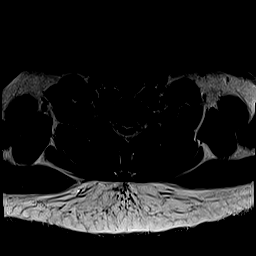
[im 10/28]
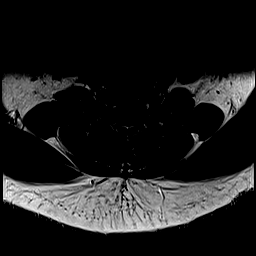
[im 12/28]
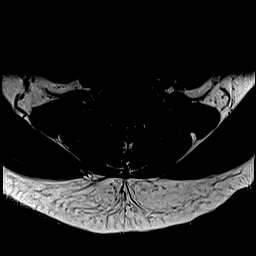
[im 14/28]
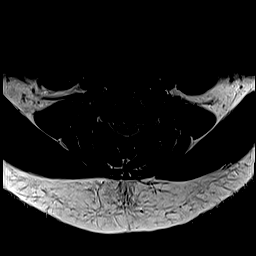
[im 16/28]
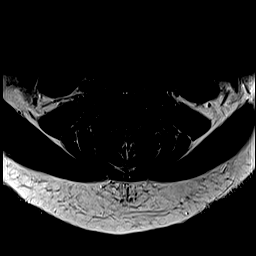
[im 19/28]
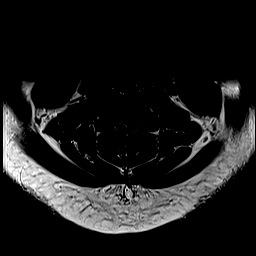
[im 23/28]
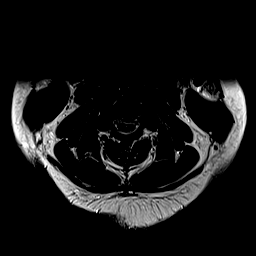
[im 28/28]
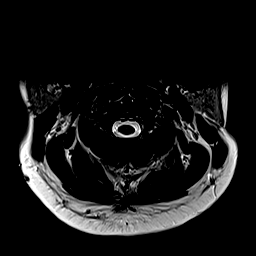

[Series 9: ax mpgr · axial · 3.0mm · 0.35mm/px · z∈[-117,-25]mm · 8 of 28 slices shown]
[im 1/28]
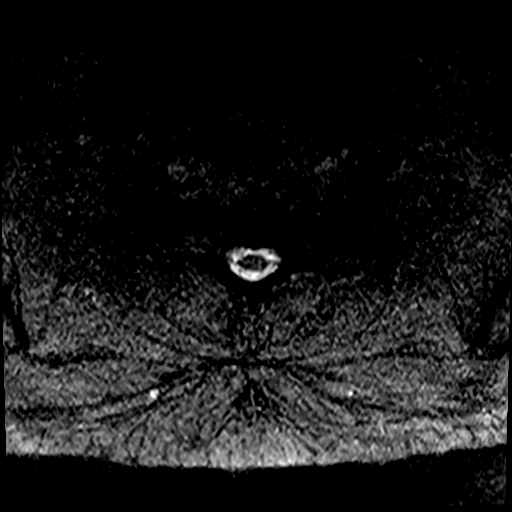
[im 5/28]
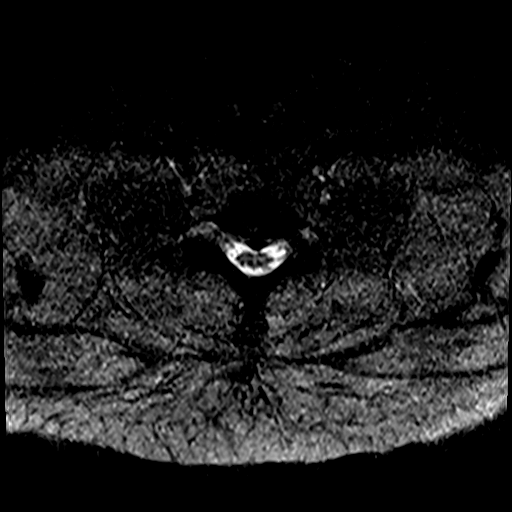
[im 10/28]
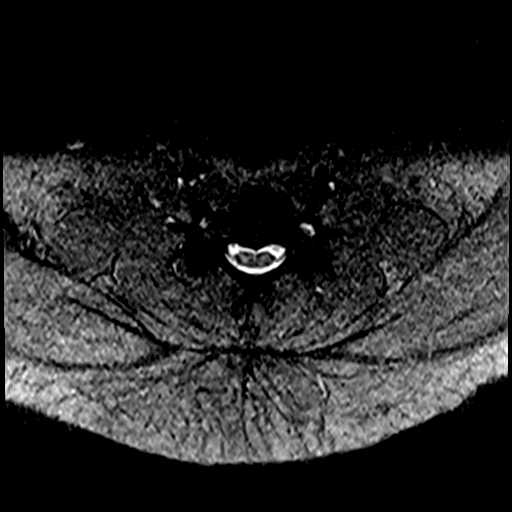
[im 12/28]
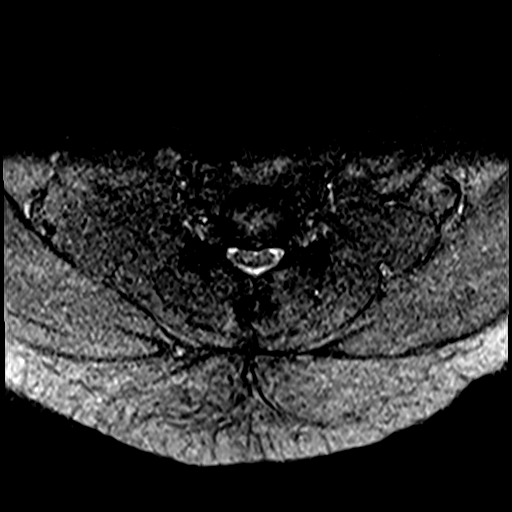
[im 16/28]
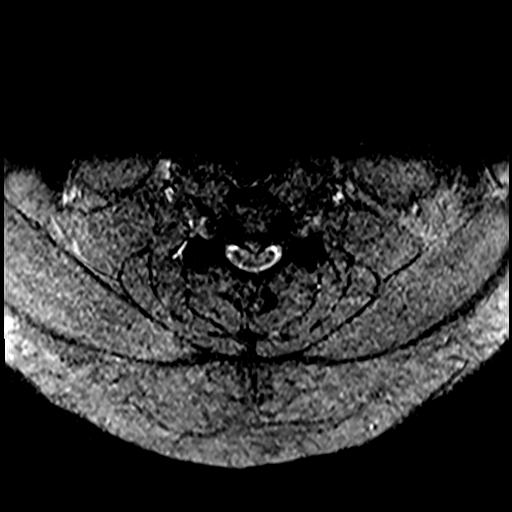
[im 19/28]
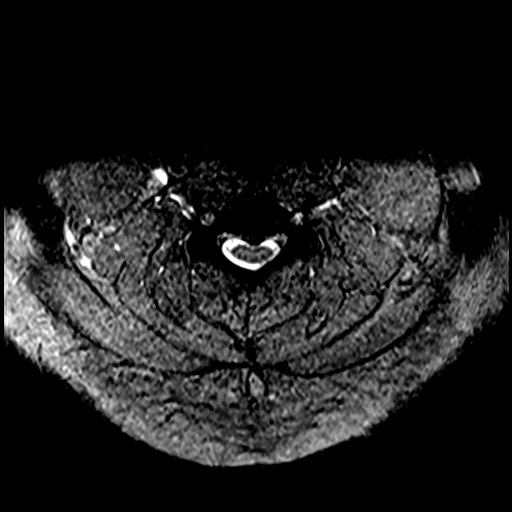
[im 23/28]
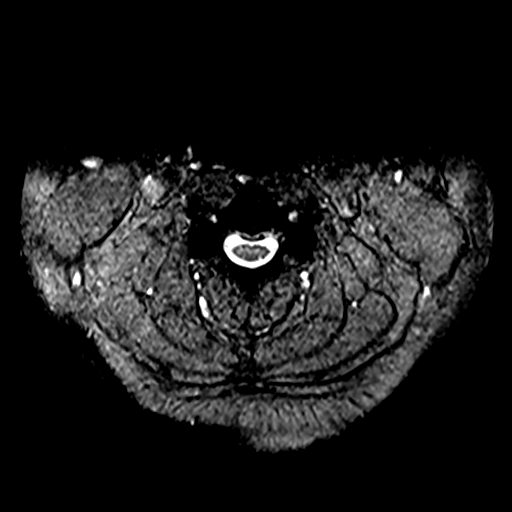
[im 28/28]
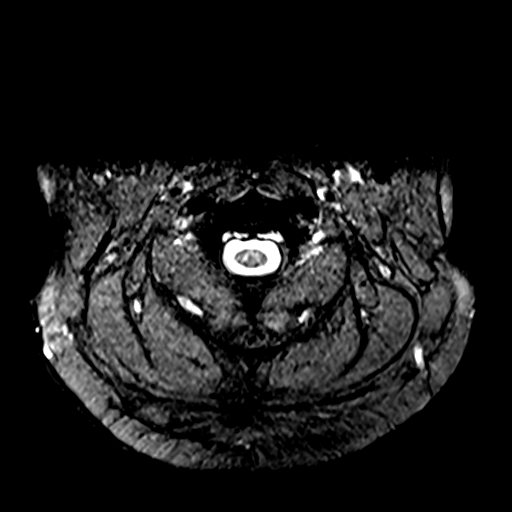

[39 of 48 positions shown; findings below may reference images not displayed]

FINDINGS: Alignment: Straightening of the cervical spine 2 mm of
anterolisthesis.

Vertebrae: Hypointense marrow which is diffuse

Cord: Normal signal and morphology.

Posterior Fossa, vertebral arteries, paraspinal tissues: Negative.

Disc levels:

C2-3: Unremarkable.

C3-4: Small central disc protrusion

C4-5: Central disc protrusion indenting the ventral cord, but dorsal
CSF is preserved. Minor facet spurring with anterolisthesis.
Foramina are patent

C5-6: Disc narrowing and mild bulging. No impingement. Small central
protrusion is seen on axial slice

C6-7: Central disc protrusion contacting but not compressing the
cord. Negative facets. Patent foramina

C7-T1:Central disc protrusion without cord compression. Patent
foramina.
IMPRESSION: 1. Central disc protrusions at C3-4 to C7-T1, most notable at C4-5
where there is indentation of the ventral cord.
2. Diffusely patent foramina.
3. Generally low marrow signal, please correlate with CBC.

## 2020-09-24 ENCOUNTER — Other Ambulatory Visit: Payer: Self-pay | Admitting: Neurosurgery

## 2020-10-17 ENCOUNTER — Encounter
Admission: RE | Admit: 2020-10-17 | Discharge: 2020-10-17 | Disposition: A | Discharge status: Home / Self Care | Payer: Self-pay | Source: Ambulatory Visit | Attending: Neurosurgery | Admitting: Neurosurgery

## 2020-10-17 ENCOUNTER — Other Ambulatory Visit: Payer: Self-pay

## 2020-10-17 DIAGNOSIS — Z01818 Encounter for other preprocedural examination: Secondary | ICD-10-CM | POA: Insufficient documentation

## 2020-10-17 HISTORY — DX: Sleep apnea, unspecified: G47.30

## 2020-10-17 HISTORY — DX: Hyperlipidemia, unspecified: E78.5

## 2020-10-17 LAB — BASIC METABOLIC PANEL
Anion gap: 10 (ref 5–15)
BUN: 12 mg/dL (ref 6–20)
CO2: 23 mmol/L (ref 22–32)
Calcium: 9.1 mg/dL (ref 8.9–10.3)
Chloride: 102 mmol/L (ref 98–111)
Creatinine, Ser: 0.96 mg/dL (ref 0.44–1.00)
GFR, Estimated: >60 mL/min (ref >60)
Glucose, Bld: 96 mg/dL (ref 70–99)
Potassium: 3.9 mmol/L (ref 3.5–5.1)
Sodium: 135 mmol/L (ref 135–145)

## 2020-10-17 LAB — CBC
HCT: 40.3 % (ref 36.0–46.0)
Hemoglobin: 13.2 g/dL (ref 12.0–15.0)
MCH: 27.8 pg (ref 26.0–34.0)
MCHC: 32.8 g/dL (ref 30.0–36.0)
MCV: 84.8 fL (ref 80.0–100.0)
Platelets: 243 10*3/uL (ref 150–400)
RBC: 4.75 MIL/uL (ref 3.87–5.11)
RDW: 13.9 % (ref 11.5–15.5)
WBC: 3.8 10*3/uL — ABNORMAL LOW (ref 4.0–10.5)
nRBC: 0 % (ref 0.0–0.2)

## 2020-10-17 LAB — SURGICAL PCR SCREEN
MRSA, PCR: NEGATIVE
Staphylococcus aureus: NEGATIVE

## 2020-10-17 LAB — TYPE AND SCREEN
ABO/RH(D): O POS
Antibody Screen: NEGATIVE

## 2020-10-17 LAB — PROTIME-INR
INR: 1 (ref 0.8–1.2)
Prothrombin Time: 13.7 seconds (ref 11.4–15.2)

## 2020-10-17 LAB — APTT: aPTT: 29 seconds (ref 24–36)

## 2020-10-17 NOTE — Patient Instructions (Signed)
Your procedure is scheduled on: 10/27/20 Report to DAY SURGERY DEPARTMENT LOCATED ON 2ND FLOOR MEDICAL MALL ENTRANCE. To find out your arrival time please call [phone] between 1PM - 3PM on 10/24/20.  Remember: Instructions that are not followed completely may result in serious medical risk, up to and including death, or upon the discretion of your surgeon and anesthesiologist your surgery may need to be rescheduled.     _X__ 1. Do not eat food after midnight the night before your procedure.                 No gum chewing or hard candies. You may drink clear liquids up to 2 hours                 before you are scheduled to arrive for your surgery- DO not drink clear                 liquids within 2 hours of the start of your surgery.                 Clear Liquids include:  water, apple juice without pulp, clear carbohydrate                 drink such as Clearfast or Gatorade, Black Coffee or Tea (Do not add                 anything to coffee or tea). Diabetics water only  __X__2.  On the morning of surgery brush your teeth with toothpaste and water, you                 may rinse your mouth with mouthwash if you wish.  Do not swallow any              toothpaste of mouthwash.     _X__ 3.  No Alcohol for 24 hours before or after surgery.   _X__ 4.  Do Not Smoke or use e-cigarettes For 24 Hours Prior to Your Surgery.                 Do not use any chewable tobacco products for at least 6 hours prior to                 surgery.  ____  5.  Bring all medications with you on the day of surgery if instructed.   __X__  6.  Notify your doctor if there is any change in your medical condition      (cold, fever, infections).     Do not wear jewelry, make-up, hairpins, clips or nail polish. Do not wear lotions, powders, or perfumes. No deodorant Do not shave body hair 48 hours prior to surgery. Men may shave face and neck. Do not bring valuables to the hospital.    [facility] is not responsible  for any belongings or valuables.  Contacts, dentures/partials or body piercings may not be worn into surgery. Bring a case for your contacts, glasses or hearing aids, a denture cup will be supplied. Leave your suitcase in the car. After surgery it may be brought to your room. For patients admitted to the hospital, discharge time is determined by your treatment team.   Patients discharged the day of surgery will not be allowed to drive home.   Please read over the following fact sheets that you were given:   MRSA Information, CHG soap  __X__ Take these medicines the morning of surgery with A  SIP OF WATER:    1. levothyroxine (SYNTHROID, LEVOTHROID) 12.5 MCG tablet  2.   3.   4.  5.  6.  ____ Fleet Enema (as directed)   __X__ Use CHG Soap/SAGE wipes as directed  ____ Use inhalers on the day of surgery  ____ Stop metformin/Janumet/Farxiga 2 days prior to surgery    ____ Take 1/2 of usual insulin dose the night before surgery. No insulin the morning          of surgery.   ____ Stop Blood Thinners Coumadin/Plavix/Xarelto/Pleta/Pradaxa/Eliquis/Effient/Aspirin  on   Or contact your Surgeon, Cardiologist or Medical Doctor regarding  ability to stop your blood thinners  __X__ Stop Anti-inflammatories 7 days before surgery such as Advil, Ibuprofen, Motrin,  BC or Goodies Powder, Naprosyn, Naproxen, Aleve, Aspirin   You may take tylenol if needed  __X__ Stop all herbal supplements, fish oil or vitamin E until after surgery.    __X__ Bring C-Pap to the hospital. Bring inside

## 2020-10-23 ENCOUNTER — Other Ambulatory Visit: Payer: Self-pay

## 2020-10-23 ENCOUNTER — Other Ambulatory Visit
Admission: RE | Admit: 2020-10-23 | Discharge: 2020-10-23 | Disposition: A | Discharge status: Home / Self Care | Payer: Self-pay | Source: Ambulatory Visit | Attending: Neurosurgery | Admitting: Neurosurgery

## 2020-10-23 DIAGNOSIS — Z01812 Encounter for preprocedural laboratory examination: Secondary | ICD-10-CM | POA: Insufficient documentation

## 2020-10-23 DIAGNOSIS — Z20822 Contact with and (suspected) exposure to covid-19: Secondary | ICD-10-CM | POA: Insufficient documentation

## 2020-10-23 LAB — SARS CORONAVIRUS 2 (TAT 6-24 HRS): SARS Coronavirus 2: NEGATIVE

## 2020-10-27 ENCOUNTER — Observation Stay
Admission: RE | Admit: 2020-10-27 | Discharge: 2020-10-28 | Disposition: A | Discharge status: Home / Self Care | Payer: Worker's Compensation | Attending: Neurosurgery | Admitting: Neurosurgery

## 2020-10-27 ENCOUNTER — Ambulatory Visit: Payer: Worker's Compensation | Admitting: Urgent Care

## 2020-10-27 ENCOUNTER — Encounter
Admission: RE | Disposition: A | Discharge status: Home / Self Care | Payer: Self-pay | Source: Home / Self Care | Attending: Neurosurgery

## 2020-10-27 ENCOUNTER — Other Ambulatory Visit: Payer: Self-pay

## 2020-10-27 ENCOUNTER — Encounter: Payer: Self-pay | Admitting: Neurosurgery

## 2020-10-27 ENCOUNTER — Ambulatory Visit: Payer: Worker's Compensation | Admitting: Certified Registered Nurse Anesthetist

## 2020-10-27 ENCOUNTER — Ambulatory Visit: Payer: Self-pay | Attending: Neurosurgery

## 2020-10-27 DIAGNOSIS — Z419 Encounter for procedure for purposes other than remedying health state, unspecified: Secondary | ICD-10-CM | POA: Insufficient documentation

## 2020-10-27 DIAGNOSIS — Z79899 Other long term (current) drug therapy: Secondary | ICD-10-CM | POA: Insufficient documentation

## 2020-10-27 DIAGNOSIS — M4802 Spinal stenosis, cervical region: Principal | ICD-10-CM | POA: Insufficient documentation

## 2020-10-27 DIAGNOSIS — G959 Disease of spinal cord, unspecified: Secondary | ICD-10-CM | POA: Diagnosis present

## 2020-10-27 HISTORY — PX: ANTERIOR CERVICAL DECOMP/DISCECTOMY FUSION: SHX1161

## 2020-10-27 LAB — ABO/RH: ABO/RH(D): O POS

## 2020-10-27 LAB — GLUCOSE, CAPILLARY: Glucose-Capillary: 192 mg/dL — ABNORMAL HIGH (ref 70–99)

## 2020-10-27 IMAGING — RF DG C-ARM 1-60 MIN
1 series · 1 of 1 positions shown · non-contrast
Comparison: [DATE]

CLINICAL DATA: C4-5 ACDF

EXAM:
CERVICAL SPINE - 2-3 VIEW; DG C-ARM 1-60 MIN

[Series 1: dg x-ray · 0.20mm/px · 1 of 1 slices shown]
[im 1/1]
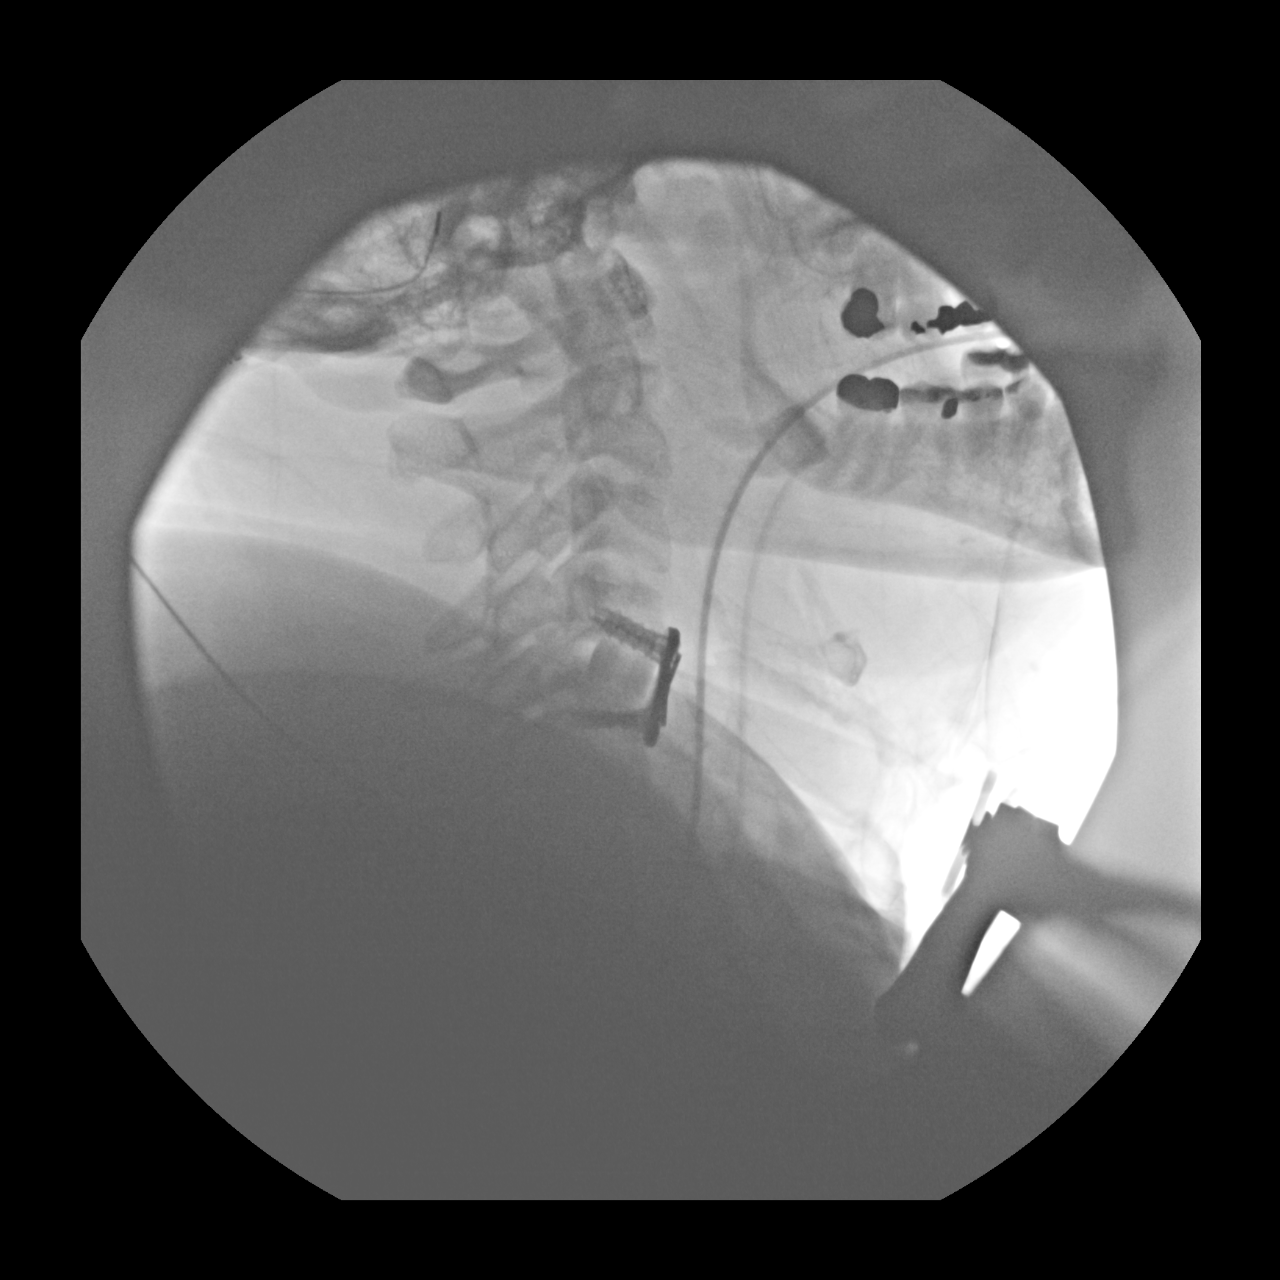

[1 of 1 positions shown; findings below may reference images not displayed]

FINDINGS: Single lateral intraoperative fluoroscopic image the cervical spine
demonstrates anterior cervical fusion hardware at the C4-5 level.
Endotracheal tube is present. 15 seconds of fluoroscopy time was
utilized. Please refer to performing physicians operative note for
further detail.
IMPRESSION: As above.

## 2020-10-27 IMAGING — RF DG CERVICAL SPINE 2 OR 3 VIEWS
1 series · 1 of 1 positions shown · non-contrast
Comparison: [DATE]

CLINICAL DATA: C4-5 ACDF

EXAM:
CERVICAL SPINE - 2-3 VIEW; DG C-ARM 1-60 MIN

[Series 1: dg x-ray · 0.20mm/px · 1 of 1 slices shown]
[im 1/1]
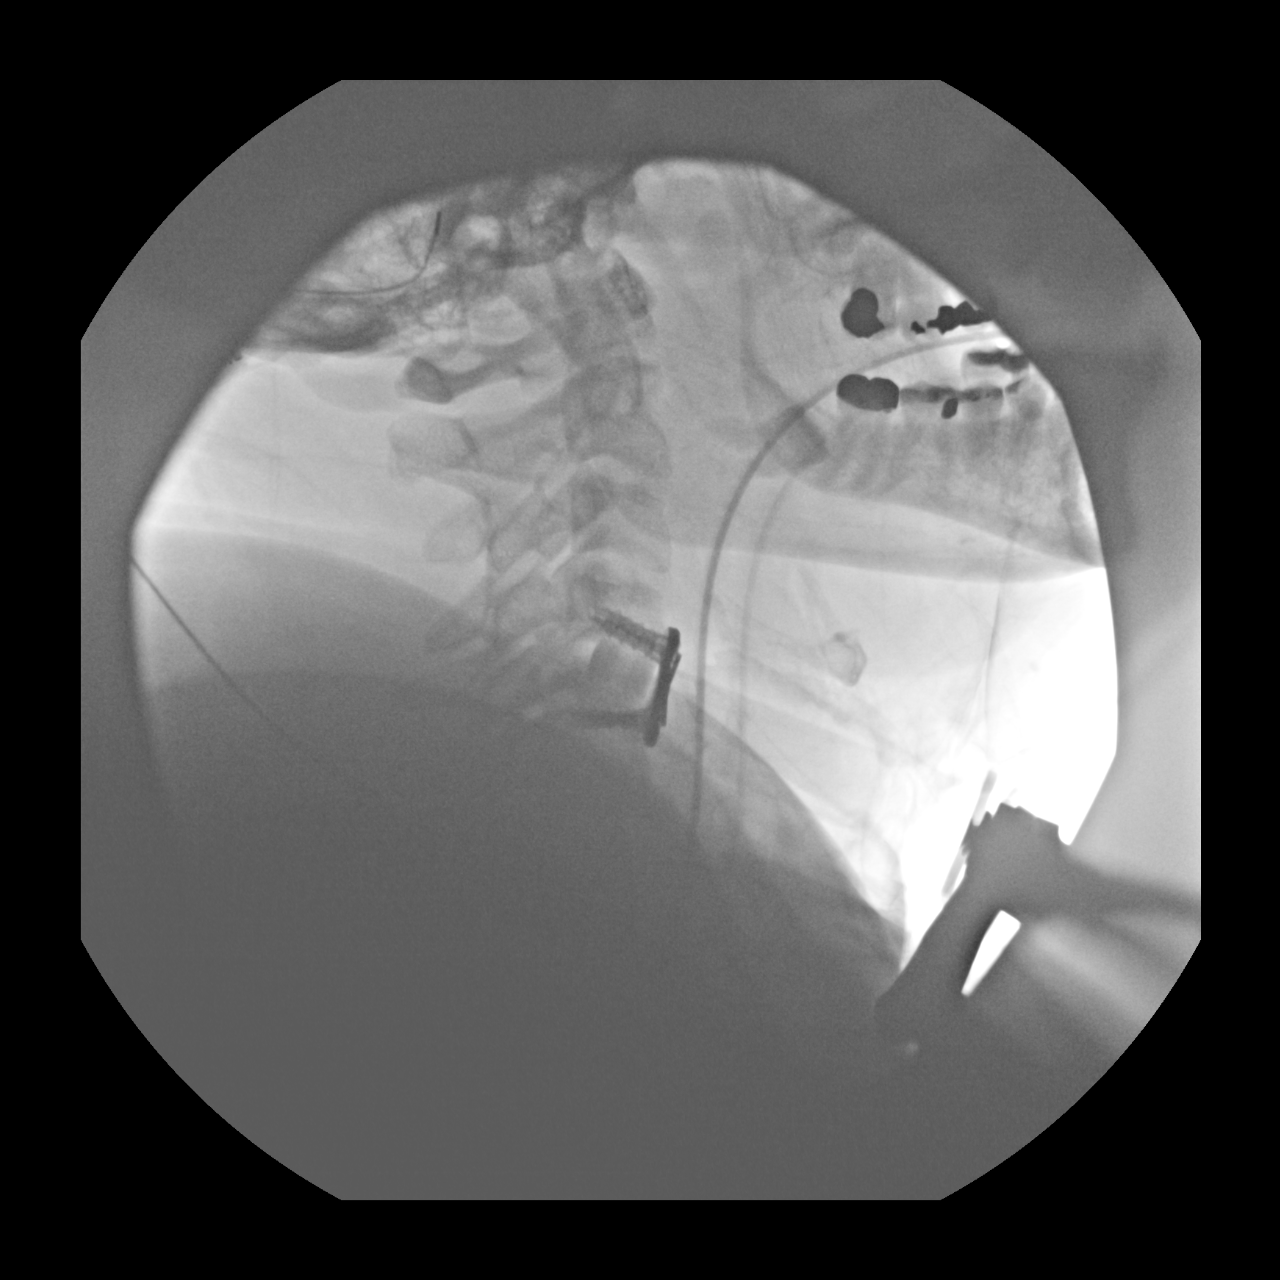

[1 of 1 positions shown; findings below may reference images not displayed]

FINDINGS: Single lateral intraoperative fluoroscopic image the cervical spine
demonstrates anterior cervical fusion hardware at the C4-5 level.
Endotracheal tube is present. 15 seconds of fluoroscopy time was
utilized. Please refer to performing physicians operative note for
further detail.
IMPRESSION: As above.

## 2020-10-27 IMAGING — RF DG C-ARM 1-60 MIN
1 series · 1 of 1 positions shown · non-contrast
Comparison: [DATE]

CLINICAL DATA: C4-5 ACDF

EXAM:
CERVICAL SPINE - 2-3 VIEW; DG C-ARM 1-60 MIN

[Series 1: dg x-ray · 0.20mm/px · 1 of 1 slices shown]
[im 1/1]
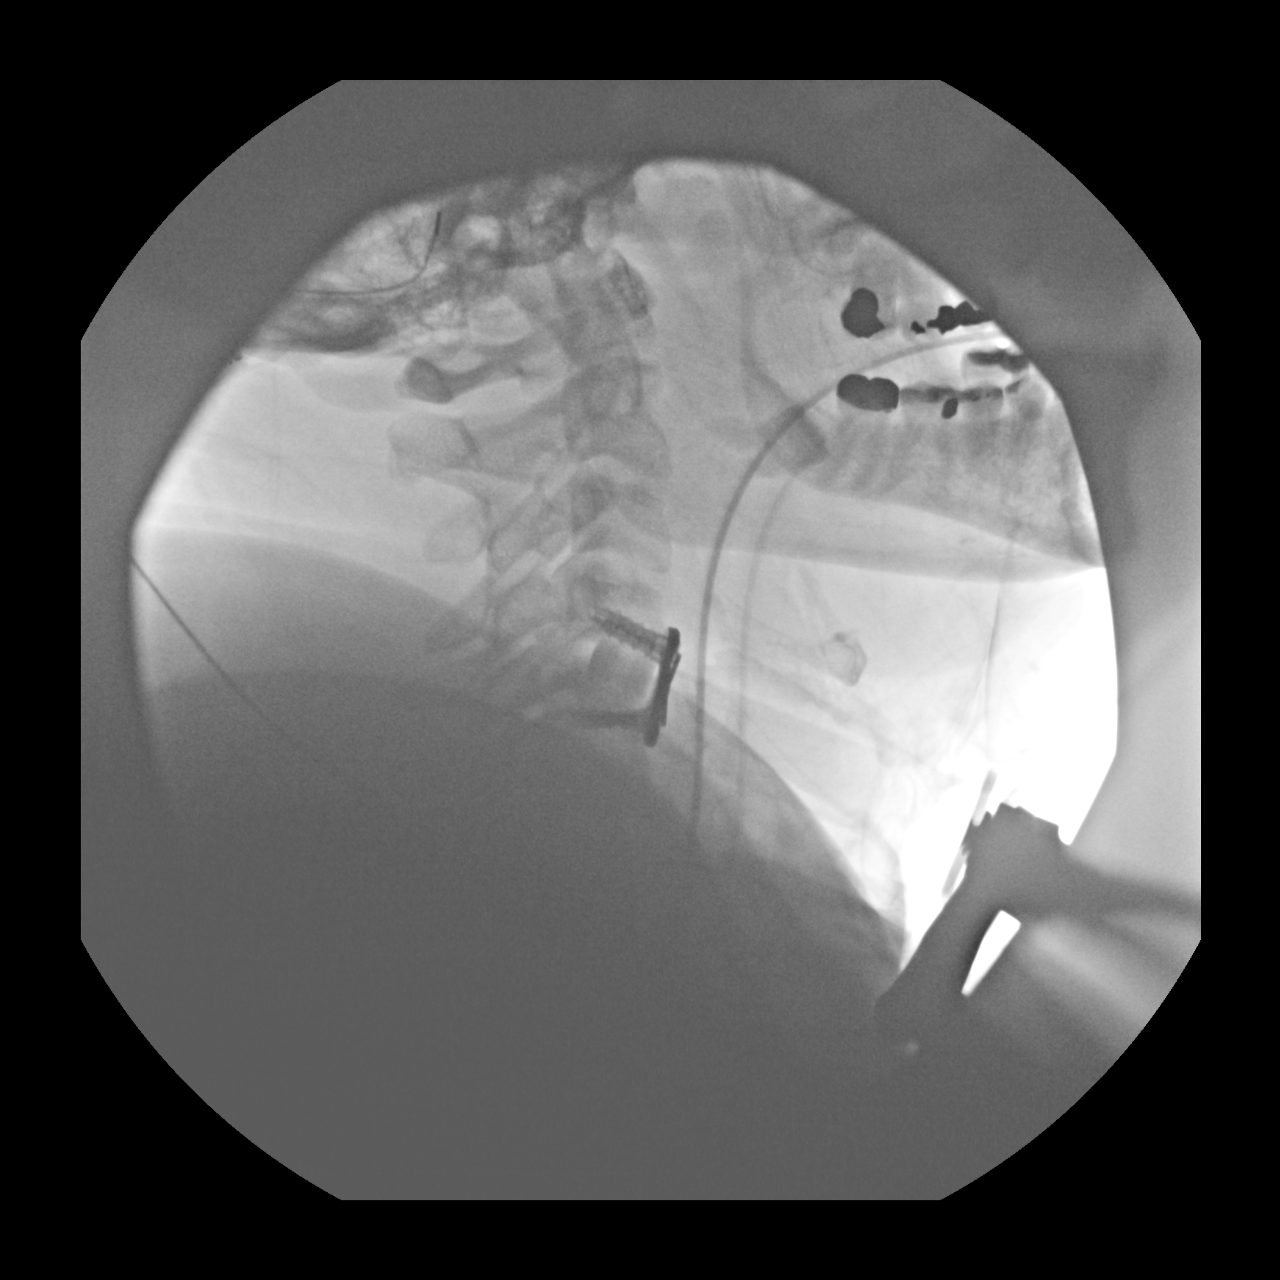

[1 of 1 positions shown; findings below may reference images not displayed]

FINDINGS: Single lateral intraoperative fluoroscopic image the cervical spine
demonstrates anterior cervical fusion hardware at the C4-5 level.
Endotracheal tube is present. 15 seconds of fluoroscopy time was
utilized. Please refer to performing physicians operative note for
further detail.
IMPRESSION: As above.

## 2020-10-27 SURGERY — ANTERIOR CERVICAL DECOMPRESSION/DISCECTOMY FUSION 1 LEVEL
Anesthesia: General

## 2020-10-27 MED ORDER — FAMOTIDINE 20 MG PO TABS: 20.0000 mg | ORAL_TABLET | Freq: Once | ORAL | Status: AC

## 2020-10-27 MED ORDER — SUCCINYLCHOLINE CHLORIDE 200 MG/10ML IV SOSY
PREFILLED_SYRINGE | INTRAVENOUS | Status: DC | PRN
  Administered 2020-10-27: 140 mg via INTRAVENOUS

## 2020-10-27 MED ORDER — SENNA 8.6 MG PO TABS
1.0000 | ORAL_TABLET | Freq: Two times a day (BID) | ORAL | Status: DC
  Administered 2020-10-28: 8.6 mg via ORAL
  Filled 2020-10-27: qty 1

## 2020-10-27 MED ORDER — SUCCINYLCHOLINE CHLORIDE 200 MG/10ML IV SOSY
PREFILLED_SYRINGE | INTRAVENOUS | Status: AC
  Filled 2020-10-27: qty 10

## 2020-10-27 MED ORDER — ROSUVASTATIN CALCIUM 10 MG PO TABS: 40.0000 mg | ORAL_TABLET | Freq: Every day | ORAL | Status: DC

## 2020-10-27 MED ORDER — SODIUM CHLORIDE 0.9% FLUSH: 3.0000 mL | INTRAVENOUS | Status: DC | PRN

## 2020-10-27 MED ORDER — OXYCODONE HCL 5 MG PO TABS
5.0000 mg | ORAL_TABLET | Freq: Once | ORAL | Status: DC | PRN
Start: 2020-10-27 — End: 2020-10-27

## 2020-10-27 MED ORDER — FENTANYL CITRATE (PF) 100 MCG/2ML IJ SOLN
INTRAMUSCULAR | Status: DC | PRN
  Administered 2020-10-27: 100 ug via INTRAVENOUS
  Administered 2020-10-27 (×4): 25 ug via INTRAVENOUS

## 2020-10-27 MED ORDER — 0.9 % SODIUM CHLORIDE (POUR BTL) OPTIME
TOPICAL | Status: DC | PRN
  Administered 2020-10-27: 1000 mL

## 2020-10-27 MED ORDER — ONDANSETRON HCL 4 MG/2ML IJ SOLN
INTRAMUSCULAR | Status: AC
  Filled 2020-10-27: qty 2

## 2020-10-27 MED ORDER — HYDROMORPHONE HCL 1 MG/ML IJ SOLN
0.5000 mg | INTRAMUSCULAR | Status: DC | PRN
  Administered 2020-10-27 (×2): 0.5 mg via INTRAVENOUS

## 2020-10-27 MED ORDER — OXYCODONE HCL 5 MG PO TABS
10.0000 mg | ORAL_TABLET | ORAL | Status: DC | PRN
  Administered 2020-10-27 – 2020-10-28 (×2): 10 mg via ORAL
  Filled 2020-10-27 (×2): qty 2

## 2020-10-27 MED ORDER — ONDANSETRON HCL 4 MG PO TABS: 4.0000 mg | ORAL_TABLET | Freq: Four times a day (QID) | ORAL | Status: DC | PRN

## 2020-10-27 MED ORDER — MORPHINE SULFATE (PF) 2 MG/ML IV SOLN
2.0000 mg | INTRAVENOUS | Status: AC | PRN
  Filled 2020-10-27: qty 1

## 2020-10-27 MED ORDER — ONDANSETRON HCL 4 MG/2ML IJ SOLN: 4.0000 mg | Freq: Four times a day (QID) | INTRAMUSCULAR | Status: DC | PRN

## 2020-10-27 MED ORDER — LACTATED RINGERS IV SOLN: INTRAVENOUS | Status: DC

## 2020-10-27 MED ORDER — ROCURONIUM BROMIDE 100 MG/10ML IV SOLN
INTRAVENOUS | Status: DC | PRN
  Administered 2020-10-27: 5 mg via INTRAVENOUS

## 2020-10-27 MED ORDER — LACTATED RINGERS IV SOLN: INTRAVENOUS | Status: DC | PRN

## 2020-10-27 MED ORDER — ONDANSETRON HCL 4 MG/2ML IJ SOLN
INTRAMUSCULAR | Status: DC | PRN
  Administered 2020-10-27: 4 mg via INTRAVENOUS

## 2020-10-27 MED ORDER — BISACODYL 10 MG RE SUPP: 10.0000 mg | Freq: Every day | RECTAL | Status: DC | PRN

## 2020-10-27 MED ORDER — FLEET ENEMA 7-19 GM/118ML RE ENEM: 1.0000 | ENEMA | Freq: Once | RECTAL | Status: DC | PRN

## 2020-10-27 MED ORDER — PHENOL 1.4 % MT LIQD
1.0000 | OROMUCOSAL | Status: DC | PRN
  Filled 2020-10-27: qty 177

## 2020-10-27 MED ORDER — METOPROLOL TARTRATE 25 MG PO TABS
25.0000 mg | ORAL_TABLET | Freq: Two times a day (BID) | ORAL | Status: DC
  Administered 2020-10-27 – 2020-10-28 (×2): 25 mg via ORAL
  Filled 2020-10-27 (×2): qty 1

## 2020-10-27 MED ORDER — LIDOCAINE HCL (CARDIAC) PF 100 MG/5ML IV SOSY
PREFILLED_SYRINGE | INTRAVENOUS | Status: DC | PRN
  Administered 2020-10-27: 100 mg via INTRAVENOUS

## 2020-10-27 MED ORDER — DEXAMETHASONE SODIUM PHOSPHATE 10 MG/ML IJ SOLN
INTRAMUSCULAR | Status: AC
  Filled 2020-10-27: qty 1

## 2020-10-27 MED ORDER — MIDAZOLAM HCL 2 MG/2ML IJ SOLN
INTRAMUSCULAR | Status: AC
  Filled 2020-10-27: qty 2

## 2020-10-27 MED ORDER — DIAZEPAM 5 MG PO TABS
5.0000 mg | ORAL_TABLET | Freq: Three times a day (TID) | ORAL | Status: DC | PRN
  Administered 2020-10-27: 5 mg via ORAL
  Filled 2020-10-27: qty 1

## 2020-10-27 MED ORDER — CEFAZOLIN IN SODIUM CHLORIDE 3-0.9 GM/100ML-% IV SOLN
3.0000 g | INTRAVENOUS | Status: AC
  Administered 2020-10-27: 3 g via INTRAVENOUS
  Filled 2020-10-27: qty 100

## 2020-10-27 MED ORDER — PHENYLEPHRINE HCL (PRESSORS) 10 MG/ML IV SOLN
INTRAVENOUS | Status: AC
  Filled 2020-10-27: qty 1

## 2020-10-27 MED ORDER — OXYCODONE HCL 5 MG PO TABS: 5.0000 mg | ORAL_TABLET | ORAL | Status: DC | PRN

## 2020-10-27 MED ORDER — FAMOTIDINE 20 MG PO TABS
ORAL_TABLET | ORAL | Status: AC
  Administered 2020-10-27: 20 mg via ORAL
  Filled 2020-10-27: qty 1

## 2020-10-27 MED ORDER — PROPOFOL 500 MG/50ML IV EMUL
INTRAVENOUS | Status: DC | PRN
  Administered 2020-10-27: 125 ug/kg/min via INTRAVENOUS

## 2020-10-27 MED ORDER — CHLORHEXIDINE GLUCONATE 0.12 % MT SOLN
OROMUCOSAL | Status: AC
  Administered 2020-10-27: 15 mL via OROMUCOSAL
  Filled 2020-10-27: qty 15

## 2020-10-27 MED ORDER — PHENYLEPHRINE HCL (PRESSORS) 10 MG/ML IV SOLN
INTRAVENOUS | Status: DC | PRN
  Administered 2020-10-27: 100 ug via INTRAVENOUS

## 2020-10-27 MED ORDER — FENTANYL CITRATE (PF) 100 MCG/2ML IJ SOLN
INTRAMUSCULAR | Status: AC
  Filled 2020-10-27: qty 2

## 2020-10-27 MED ORDER — MIDAZOLAM HCL 2 MG/2ML IJ SOLN
INTRAMUSCULAR | Status: DC | PRN
  Administered 2020-10-27: 2 mg via INTRAVENOUS
  Administered 2020-10-27: 1 mg via INTRAVENOUS

## 2020-10-27 MED ORDER — ACETAMINOPHEN 10 MG/ML IV SOLN
INTRAVENOUS | Status: DC | PRN
  Administered 2020-10-27: 1000 mg via INTRAVENOUS

## 2020-10-27 MED ORDER — CHLORHEXIDINE GLUCONATE 0.12 % MT SOLN: 15.0000 mL | Freq: Once | OROMUCOSAL | Status: AC

## 2020-10-27 MED ORDER — EPHEDRINE SULFATE 50 MG/ML IJ SOLN
INTRAMUSCULAR | Status: DC | PRN
  Administered 2020-10-27 (×3): 5 mg via INTRAVENOUS

## 2020-10-27 MED ORDER — MENTHOL 3 MG MT LOZG
1.0000 | LOZENGE | OROMUCOSAL | Status: DC | PRN
  Filled 2020-10-27: qty 9

## 2020-10-27 MED ORDER — HEMOSTATIC AGENTS (NO CHARGE) OPTIME
TOPICAL | Status: DC | PRN
  Administered 2020-10-27: 1 via TOPICAL

## 2020-10-27 MED ORDER — SODIUM CHLORIDE 0.9 % IV SOLN: INTRAVENOUS | Status: DC

## 2020-10-27 MED ORDER — REMIFENTANIL HCL 1 MG IV SOLR
INTRAVENOUS | Status: AC
  Filled 2020-10-27: qty 1000

## 2020-10-27 MED ORDER — DEXAMETHASONE SODIUM PHOSPHATE 10 MG/ML IJ SOLN
INTRAMUSCULAR | Status: DC | PRN
  Administered 2020-10-27: 10 mg via INTRAVENOUS

## 2020-10-27 MED ORDER — PROPOFOL 1000 MG/100ML IV EMUL
INTRAVENOUS | Status: AC
  Filled 2020-10-27: qty 100

## 2020-10-27 MED ORDER — ACETAMINOPHEN 10 MG/ML IV SOLN
INTRAVENOUS | Status: AC
  Filled 2020-10-27: qty 100

## 2020-10-27 MED ORDER — METHOCARBAMOL 500 MG PO TABS
500.0000 mg | ORAL_TABLET | Freq: Four times a day (QID) | ORAL | Status: DC
  Administered 2020-10-27 – 2020-10-28 (×4): 500 mg via ORAL
  Filled 2020-10-27 (×4): qty 1

## 2020-10-27 MED ORDER — ACETAMINOPHEN 650 MG RE SUPP: 650.0000 mg | RECTAL | Status: DC

## 2020-10-27 MED ORDER — VITAMIN D 25 MCG (1000 UNIT) PO TABS
1000.0000 [IU] | ORAL_TABLET | Freq: Every day | ORAL | Status: DC
  Administered 2020-10-28: 1000 [IU] via ORAL
  Filled 2020-10-27: qty 1

## 2020-10-27 MED ORDER — GLYCOPYRROLATE 0.2 MG/ML IJ SOLN
INTRAMUSCULAR | Status: DC | PRN
  Administered 2020-10-27: .2 mg via INTRAVENOUS

## 2020-10-27 MED ORDER — SODIUM CHLORIDE 0.9 % IV SOLN: 250.0000 mL | INTRAVENOUS | Status: DC

## 2020-10-27 MED ORDER — LIDOCAINE-EPINEPHRINE 1 %-1:100000 IJ SOLN
INTRAMUSCULAR | Status: AC
  Filled 2020-10-27: qty 1

## 2020-10-27 MED ORDER — PROPOFOL 10 MG/ML IV BOLUS
INTRAVENOUS | Status: DC | PRN
  Administered 2020-10-27: 200 mg via INTRAVENOUS

## 2020-10-27 MED ORDER — POLYETHYLENE GLYCOL 3350 17 G PO PACK: 17.0000 g | PACK | Freq: Every day | ORAL | Status: DC | PRN

## 2020-10-27 MED ORDER — SODIUM CHLORIDE 0.9 % IV SOLN
INTRAVENOUS | Status: DC | PRN
  Administered 2020-10-27: .1 ug/kg/min via INTRAVENOUS

## 2020-10-27 MED ORDER — ORAL CARE MOUTH RINSE: 15.0000 mL | Freq: Once | OROMUCOSAL | Status: AC

## 2020-10-27 MED ORDER — ACETAMINOPHEN 325 MG PO TABS
650.0000 mg | ORAL_TABLET | ORAL | Status: DC
  Administered 2020-10-27 – 2020-10-28 (×5): 650 mg via ORAL
  Filled 2020-10-27 (×5): qty 2

## 2020-10-27 MED ORDER — PHENYLEPHRINE HCL-NACL 20-0.9 MG/250ML-% IV SOLN
INTRAVENOUS | Status: DC | PRN
  Administered 2020-10-27: 25 ug/min via INTRAVENOUS

## 2020-10-27 MED ORDER — BUPIVACAINE-EPINEPHRINE (PF) 0.5% -1:200000 IJ SOLN
INTRAMUSCULAR | Status: AC
  Filled 2020-10-27: qty 30

## 2020-10-27 MED ORDER — LIDOCAINE HCL (PF) 2 % IJ SOLN
INTRAMUSCULAR | Status: AC
  Filled 2020-10-27: qty 5

## 2020-10-27 MED ORDER — ROCURONIUM BROMIDE 10 MG/ML (PF) SYRINGE
PREFILLED_SYRINGE | INTRAVENOUS | Status: AC
  Filled 2020-10-27: qty 10

## 2020-10-27 MED ORDER — HYDROMORPHONE HCL 1 MG/ML IJ SOLN
INTRAMUSCULAR | Status: AC
  Filled 2020-10-27: qty 1

## 2020-10-27 MED ORDER — THROMBIN 5000 UNITS EX SOLR
CUTANEOUS | Status: DC | PRN
  Administered 2020-10-27: 5000 [IU] via TOPICAL

## 2020-10-27 MED ORDER — SODIUM CHLORIDE 0.9% FLUSH
3.0000 mL | Freq: Two times a day (BID) | INTRAVENOUS | Status: DC
  Administered 2020-10-28: 3 mL via INTRAVENOUS

## 2020-10-27 MED ORDER — PROPOFOL 10 MG/ML IV BOLUS
INTRAVENOUS | Status: AC
  Filled 2020-10-27: qty 20

## 2020-10-27 MED ORDER — FENTANYL CITRATE (PF) 100 MCG/2ML IJ SOLN
25.0000 ug | INTRAMUSCULAR | Status: DC | PRN
  Administered 2020-10-27 (×4): 50 ug via INTRAVENOUS

## 2020-10-27 MED ORDER — LIDOCAINE-EPINEPHRINE 1 %-1:100000 IJ SOLN
INTRAMUSCULAR | Status: DC | PRN
  Administered 2020-10-27: 4 mL

## 2020-10-27 MED ORDER — LEVOTHYROXINE SODIUM 25 MCG PO TABS
12.5000 ug | ORAL_TABLET | Freq: Every day | ORAL | Status: DC
  Administered 2020-10-28: 12.5 ug via ORAL
  Filled 2020-10-27: qty 1

## 2020-10-27 MED ORDER — OXYCODONE HCL 5 MG/5ML PO SOLN
5.0000 mg | Freq: Once | ORAL | Status: DC | PRN
Start: 2020-10-27 — End: 2020-10-27

## 2020-10-27 SURGICAL SUPPLY — 71 items
ADH SKN CLS APL DERMABOND .7 (GAUZE/BANDAGES/DRESSINGS) ×1
AGENT HMST MTR 8 SURGIFLO (HEMOSTASIS) ×1
APL PRP STRL LF DISP 70% ISPRP (MISCELLANEOUS) ×2
BIT DRILL 13 (BIT) ×2 IMPLANT
BLADE BOVIE TIP EXT 4 (BLADE) ×2 IMPLANT
BLADE SURG 15 STRL LF DISP TIS (BLADE) ×1 IMPLANT
BLADE SURG 15 STRL SS (BLADE) ×2
BONE WEDGE CONERSTONE 7X14X11 (Bone Implant) ×2 IMPLANT
BUR DIAMOND COARSE 4.0 RND (BURR) IMPLANT
BUR NEURO DRILL SOFT 3.0X3.8M (BURR) ×2 IMPLANT
CHLORAPREP W/TINT 26 (MISCELLANEOUS) ×4 IMPLANT
COLLAR CERV LRG MED DENS 3 (SOFTGOODS) ×2 IMPLANT
COUNTER NEEDLE 20/40 LG (NEEDLE) ×2 IMPLANT
COVER LIGHT HANDLE STERIS (MISCELLANEOUS) ×4 IMPLANT
CUP MEDICINE 2OZ PLAST GRAD ST (MISCELLANEOUS) ×4 IMPLANT
DERMABOND ADVANCED (GAUZE/BANDAGES/DRESSINGS) ×1
DERMABOND ADVANCED .7 DNX12 (GAUZE/BANDAGES/DRESSINGS) ×1 IMPLANT
DRAPE C-ARM 42X72 X-RAY (DRAPES) ×4 IMPLANT
DRAPE MICROSCOPE SPINE 48X150 (DRAPES) ×2 IMPLANT
DRAPE SURG 17X11 SM STRL (DRAPES) ×4 IMPLANT
DRAPE THYROID T SHEET (DRAPES) ×2 IMPLANT
DRSG TEGADERM 4X4.75 (GAUZE/BANDAGES/DRESSINGS) ×4 IMPLANT
DRSG TELFA 4X3 1S NADH ST (GAUZE/BANDAGES/DRESSINGS) ×2 IMPLANT
ELECT CAUTERY BLADE TIP 2.5 (TIP) ×2
ELECT EZSTD 165MM 6.5IN (MISCELLANEOUS) ×2
ELECTRODE CAUTERY BLDE TIP 2.5 (TIP) ×1 IMPLANT
ELECTRODE EZSTD 165MM 6.5IN (MISCELLANEOUS) ×1 IMPLANT
FEE INTRAOP CADWELL SUPPLY NCS (MISCELLANEOUS) ×1 IMPLANT
FEE INTRAOP MONITOR IMPULS NCS (MISCELLANEOUS) ×1 IMPLANT
GAUZE 4X4 16PLY ~~LOC~~+RFID DBL (SPONGE) ×2 IMPLANT
GAUZE SPONGE 4X4 12PLY STRL (GAUZE/BANDAGES/DRESSINGS) ×2 IMPLANT
GLOVE SRG 8 PF TXTR STRL LF DI (GLOVE) ×1 IMPLANT
GLOVE SURG SYN 6.5 ES PF (GLOVE) ×4 IMPLANT
GLOVE SURG SYN 7.0 (GLOVE) ×4 IMPLANT
GLOVE SURG SYN 8.0 (GLOVE) ×4 IMPLANT
GLOVE SURG UNDER POLY LF SZ6.5 (GLOVE) ×4 IMPLANT
GLOVE SURG UNDER POLY LF SZ8 (GLOVE) ×2
GOWN SRG LRG LVL 4 IMPRV REINF (GOWNS) ×2 IMPLANT
GOWN STRL REIN LRG LVL4 (GOWNS) ×4
GOWN STRL REUS W/ TWL XL LVL3 (GOWN DISPOSABLE) ×2 IMPLANT
GOWN STRL REUS W/TWL XL LVL3 (GOWN DISPOSABLE) ×4
GRADUATE 1200CC STRL 31836 (MISCELLANEOUS) ×2 IMPLANT
INTRAOP CADWELL SUPPLY FEE NCS (MISCELLANEOUS) ×1
INTRAOP DISP SUPPLY FEE NCS (MISCELLANEOUS) ×2
INTRAOP MONITOR FEE IMPULS NCS (MISCELLANEOUS) ×1
INTRAOP MONITOR FEE IMPULSE (MISCELLANEOUS) ×1
KIT TURNOVER KIT A (KITS) ×2 IMPLANT
MANIFOLD NEPTUNE II (INSTRUMENTS) ×2 IMPLANT
MARKER SKIN DUAL TIP RULER LAB (MISCELLANEOUS) ×4 IMPLANT
NEEDLE HYPO 22GX1.5 SAFETY (NEEDLE) ×2 IMPLANT
NEEDLE SPNL 22GX3.5 QUINCKE BK (NEEDLE) ×2 IMPLANT
NS IRRIG 1000ML POUR BTL (IV SOLUTION) ×2 IMPLANT
PACK LAMINECTOMY NEURO (CUSTOM PROCEDURE TRAY) ×2 IMPLANT
PAD ARMBOARD 7.5X6 YLW CONV (MISCELLANEOUS) ×4 IMPLANT
PIN CASPAR 14 (PIN) ×1 IMPLANT
PIN CASPAR 14MM (PIN) ×2
PLATE ZEVO 1LVL 19MM (Plate) ×2 IMPLANT
PUTTY DBF 1CC CORTICAL FIBERS (Putty) ×2 IMPLANT
SCREW 3.5 SELFDRILL 15MM VARI (Screw) ×8 IMPLANT
SPOGE SURGIFLO 8M (HEMOSTASIS) ×1
SPONGE KITTNER 5P (MISCELLANEOUS) ×2 IMPLANT
SPONGE SURGIFLO 8M (HEMOSTASIS) ×1 IMPLANT
SUT POLYSORB 2-0 5X18 GS-10 (SUTURE) ×4 IMPLANT
SUT VICRYL 2-0 SH 8X27 (SUTURE) IMPLANT
SUT VICRYL 3-0 CR8 SH (SUTURE) ×2 IMPLANT
SYR 30ML LL (SYRINGE) ×2 IMPLANT
TAPE CLOTH 3X10 WHT NS LF (GAUZE/BANDAGES/DRESSINGS) ×2 IMPLANT
TOWEL OR 17X26 4PK STRL BLUE (TOWEL DISPOSABLE) ×4 IMPLANT
TRAY FOLEY MTR SLVR 16FR STAT (SET/KITS/TRAYS/PACK) IMPLANT
TUBING CONNECTING 10 (TUBING) ×2 IMPLANT
WATER STERILE IRR 500ML POUR (IV SOLUTION) ×2 IMPLANT

## 2020-10-27 NOTE — Op Note (Signed)
Operative Note   SURGERY DATE:  10/27/2020   PRE-OP DIAGNOSIS:  Cervical Myelopathy    POST-OP DIAGNOSIS: Post-Op Diagnosis Codes: Cervical Myelopathy   Procedure(s) with comments: C4/5 Anterior Cervical Discectomy and Fusion   SURGEON:     * Nathaniel Man, MD       Manning Charity, PA Assistant   ANESTHESIA: General    OPERATIVE FINDINGS: Stenosis at C4/5  Procedure Indications Leslie Meadows presented to our clinic on 8/9 with ongoing upper extremity pain and numbness. She had a MRI that showed disc herniation causing severe stenosis at C4/5.  She had been to physiatry and physical therapy in the past. Given this, we recommended an anterior cervical decompression and fusion to relieve the pressure on the spinal cord. The risks of hematoma, infection, poor bone healing and failure of fusion, cord injury, weakness, numbness, neck pain, stroke, and death were discussed in detail. All questions were answered and the patient elected to proceed with the surgery.     Procedure After obtaining informed consent, the patient was taken to the Operating Room where general anesthesia was induced and the patient intubated. Vascular access was obtained. Decadron was administered. The head was slightly extended and imaging used to identify a skin crease overlying the C4 vertebral body.     The patient was prepped and draped in the usual sterile fashion and a timeout was performed per protocol. Local anesthesia was instilled with epinephrine along the planned incision site. A transverse cervical incision was performed on the left in a skin crease. The incision was carried to the level of the platysma and then cautery was used to incise the muscle. Blunt dissection was used to expand the plane and the dissection was carried deep medial to the SCM and carotid sheath being careful to identify the trachea and esophagus medially. The prevertebral fascia was identified and this was bluntly dissected to expose the  disc spaces. A needle was placed in the disc space and x-ray confirmed the C4/5 disc level.     Next, cautery was used to undermine the longus colli muscles bilaterally and identify the C4/5 disc space. Caspar pins were placed at C4 and C5 and a retractor system placed under the muscles to complete the exposure. Next, a combination of curettes and Kerrison rongeurs were used to remove the anterior osteophyte at C4/5 and then the disc material. A 9mm matchstick was used to shave the endplates of the adjacent bodies. A trial spacer was used to size the graft and then hemostasis obtained. The microscope was brought into the field for the remainder of the surgery. The drill was used to remove the osteophyte/disc complex deep to the level of the PLL at C4/5. There was significant disc protrusion at this level that was removed with rongeurs. The PLL was entered with a hook and then the PLL was removed along with remaining disc material to decompress centrally and then out into bilateral neuro foramen. A blunt probe was used to confirm no residual stenosis laterally and a curette used to ensure no posterior osteophyte remained. Floseal was used for hemostasis. Allograft was placed, 70mm in height and placed slightly recessed to the anterior edge of vertebral body  X-ray confirmed good placement of graft.  The caspar pins were removed and bone wax placed. The remainder of the osteophytes were drilled to allow for plating. Next, a 3mm plate was found to be the adequate size and was placed in the midline and secured with two 21mm  screws at each bone level. Xray was obtained confirming good graft placement and adequate depth of screws. The retractors were removed. The wound was irrigated copiously and hemostasis obtained. The platysma was closed with 2-0 vicryl suture. The dermis was closed with 3-0 Vicryl and Dermabond was placed on the skin.    The patient had general anesthesia reversed and was extubated following the  procedure. She awoke following commands with symmetric movement. She was taken to the PACU where she continued recovery and then the ward.     ESTIMATED BLOOD LOSS:   20 cc   SPECIMENS None   IMPLANT PUTTY DBF 1CC CORTICAL FIBERS - [id number]  Inventory Item: PUTTY DBF 1CC CORTICAL FIBERS Serial no.: [id number] Model/Cat no.: [id number]  Implant name: PUTTY DBF 1CC CORTICAL FIBERS - [id number] Laterality: N/A Area: Cervical Level 4-5  Manufacturer: [employer]arry Date of Manufacture:    Action: Implanted Number Used: 1   Device Identifier:  Device Identifier Type:     BONE WEDGE CONERSTONE [id number] - [id number]  Inventory Item: BONE WEDGE CONERSTONE [id number] Serial no.: [id number] Model/Cat no.: [id number]  Implant name: BONE Gaynelle Arabian [id number] - [id number] Laterality: N/A Area: Cervical Level 4-5  Manufacturer: [employer] Date of Manufacture:    Action: Implanted Number Used: 1   Device Identifier:  Device Identifier Type:     PLATE ZEVO 1LVL - [id number]  Inventory Item: PLATE ZEVO 1LVL Serial no.:  Model/Cat no.: [id number]  Implant name: PLATE ZEVO 1LVL - [id number] Laterality: N/A Area: Cervical Level 4-5  Manufacturer: [employer]arry Date of Manufacture:    Action: Implanted Number Used: 1   Device Identifier:  Device Identifier Type:     SCREW 3.5 SELFDRILL VARI - [id number]  Inventory Item: SCREW 3.5 SELFDRILL VARI Serial no.:  Model/Cat no.: [id number]  Implant name: SCREW 3.5 SELFDRILL VARI - [id number] Laterality: N/A Area: Cervical Level 4-5  Manufacturer: [employer] Bing [employer]arry Date of Manufacture:    Action: Implanted Number Used: 4   Device Identifier:  Device Identifier Type:         I performed the case in its entirety with assistance of PA, Flora Lipps, MD [phone]

## 2020-10-27 NOTE — Anesthesia Preprocedure Evaluation (Signed)
Anesthesia Evaluation  Patient identified by MRN, date of birth, ID band Patient awake    Reviewed: Allergy & Precautions, NPO status , Patient's Chart, lab work & pertinent test results  History of Anesthesia Complications Negative for: history of anesthetic complications  Airway Mallampati: III  TM Distance: >3 FB Neck ROM: full    Dental  (+) Chipped   Pulmonary sleep apnea ,    Pulmonary exam normal        Cardiovascular negative cardio ROS Normal cardiovascular exam     Neuro/Psych PSYCHIATRIC DISORDERS negative neurological ROS     GI/Hepatic negative GI ROS, Neg liver ROS, neg GERD  ,  Endo/Other  negative endocrine ROS  Renal/GU      Musculoskeletal   Abdominal   Peds  Hematology negative hematology ROS (+)   Anesthesia Other Findings Past Medical History: No date: HLD (hyperlipidemia) No date: Sleep apnea No date: Thyroid disease  Past Surgical History: No date: ABDOMINAL HYSTERECTOMY No date: TUBAL LIGATION     Reproductive/Obstetrics negative OB ROS                             Anesthesia Physical Anesthesia Plan  ASA: 3  Anesthesia Plan: General ETT   Post-op Pain Management:    Induction: Intravenous  PONV Risk Score and Plan: Ondansetron, Dexamethasone, Midazolam and Treatment may vary due to age or medical condition  Airway Management Planned: Oral ETT  Additional Equipment:   Intra-op Plan:   Post-operative Plan: Extubation in OR  Informed Consent: I have reviewed the patients History and Physical, chart, labs and discussed the procedure including the risks, benefits and alternatives for the proposed anesthesia with the patient or authorized representative who has indicated his/her understanding and acceptance.     Dental Advisory Given  Plan Discussed with: Anesthesiologist, CRNA and Surgeon  Anesthesia Plan Comments: (Patient consented for  risks of anesthesia including but not limited to:  - adverse reactions to medications - damage to eyes, teeth, lips or other oral mucosa - nerve damage due to positioning  - sore throat or hoarseness - Damage to heart, brain, nerves, lungs, other parts of body or loss of life  Patient voiced understanding.)        Anesthesia Quick Evaluation

## 2020-10-27 NOTE — Anesthesia Procedure Notes (Signed)
Procedure Name: Intubation Date/Time: 10/27/2020 11:49 AM Performed by: Malva Cogan, CRNA Pre-anesthesia Checklist: Patient identified, Patient being monitored, Timeout performed, Emergency Drugs available and Suction available Patient Re-evaluated:Patient Re-evaluated prior to induction Oxygen Delivery Method: Circle system utilized Preoxygenation: Pre-oxygenation with 100% oxygen Induction Type: IV induction Ventilation: Mask ventilation without difficulty Laryngoscope Size: 3 and McGraph Grade View: Grade II Tube type: Oral Tube size: 7.0 mm Number of attempts: 1 Airway Equipment and Method: Stylet and Video-laryngoscopy Placement Confirmation: ETT inserted through vocal cords under direct vision, positive ETCO2 and breath sounds checked- equal and bilateral Secured at: 21 cm Tube secured with: Tape Dental Injury: Teeth and Oropharynx as per pre-operative assessment  Difficulty Due To: Difficulty was anticipated, Difficult Airway- due to large tongue and Difficult Airway- due to anterior larynx Future Recommendations: Recommend- induction with short-acting agent, and alternative techniques readily available Comments: DL x1 by C. Keyan Folson, unable to place to ETT.  DL by Dr. Aaron Edelman successfully placed.

## 2020-10-27 NOTE — Transfer of Care (Signed)
Immediate Anesthesia Transfer of Care Note  Patient: Leslie Meadows  Procedure(s) Performed: C4-5 ANTERIOR CERVICAL DECOMPRESSION/DISCECTOMY FUSION 1 LEVEL  Patient Location: PACU  Anesthesia Type:General  Level of Consciousness: awake, alert  and oriented  Airway & Oxygen Therapy: Patient Spontanous Breathing and Patient connected to face mask oxygen  Post-op Assessment: Report given to RN and Post -op Vital signs reviewed and stable  Post vital signs: Reviewed and stable  Last Vitals:  Vitals Value Taken Time  BP 113/77 10/27/20 1418  Temp    Pulse 130 10/27/20 1421  Resp 10 10/27/20 1421  SpO2 100 % 10/27/20 1421  Vitals shown include unvalidated device data.  Last Pain:  Vitals:   10/27/20 1029  PainSc: 0-No pain         Complications: No notable events documented.

## 2020-10-27 NOTE — Progress Notes (Signed)
   10/27/20 2100  Clinical Encounter Type  Visited With Patient not available;Health care provider  Visit Type Other (Comment) (Rapid Response)  Referral From Nurse   This chaplain responded to a rapid response alert for the patient. Upon my arrival, the medical team was assessing the patient and administering care. No family present at the time. This chaplain maintained pastoral presence in the hallway. Will continue to follow. Spiritual Care remains available to support the patient if/when necessary.  Clovis Riley, Chaplain

## 2020-10-27 NOTE — Progress Notes (Signed)
Pharmacy Antibiotic Note  Leslie Meadows is a 50 y.o. female admitted on (Not on file) with surgical prophylaxis.  Pharmacy has been consulted for Cefazolin  dosing.  Plan: TBW = 122 kg  Cefazolin 3 gm IV X 1 60 min pre-op ordered for 9/19 @ 0500.      No data recorded.  No results for input(s): WBC, CREATININE, LATICACIDVEN, VANCOTROUGH, VANCOPEAK, VANCORANDOM, GENTTROUGH, GENTPEAK, GENTRANDOM, TOBRATROUGH, TOBRAPEAK, TOBRARND, AMIKACINPEAK, AMIKACINTROU, AMIKACIN in the last 168 hours.  Estimated Creatinine Clearance: 88.3 mL/min (by C-G formula based on SCr of 0.96 mg/dL).    No Known Allergies  Antimicrobials this admission:   >>    >>   Dose adjustments this admission:   Microbiology results:  BCx:   UCx:    Sputum:    MRSA PCR:   Thank you for allowing pharmacy to be a part of this patient's care.  Diem Pagnotta D 10/27/2020 2:37 AM

## 2020-10-27 NOTE — H&P (Signed)
Leslie Meadows is an 49 y.o. female.   Chief Complaint:  HPI: Ms. Tool returns today after having seen last year for concern for cervical spinal stenosis and early myelopathy. She states that she is still having the neck pain as well as a tingling sensation going down the arm. This will increase at times with head movements and will occur more on the left side. The sensations does go into the hand in all dermatomes. She has less of these on the right side. She does not endorse any sharp shooting pain. She has gone for treatments with the physiatrist including injections which did not provide any significant relief. She has been to therapy training in the past. She is here after a recent updated MRI showing C4/5 severe stenosis. Given this, we discussed a C4/5 ACDF and she wishes to proceed.   Past Medical History:  Diagnosis Date   HLD (hyperlipidemia)    Sleep apnea    Thyroid disease     Past Surgical History:  Procedure Laterality Date   ABDOMINAL HYSTERECTOMY     TUBAL LIGATION      Family History  Problem Relation Age of Onset   Breast cancer Paternal Grandmother    Social History:  reports that she has never smoked. She has never used smokeless tobacco. She reports that she does not drink alcohol and does not use drugs.  Allergies: No Known Allergies  Medications Prior to Admission  Medication Sig Dispense Refill   cholecalciferol (VITAMIN D3) 25 MCG (1000 UNIT) tablet Take 1,000 Units by mouth daily.     levothyroxine (SYNTHROID, LEVOTHROID) 25 MCG tablet Take 12.5 mcg by mouth daily before breakfast.     NON FORMULARY Pt uses a cpap nightly     rosuvastatin (CRESTOR) 40 MG tablet Take 40 mg by mouth at bedtime.      No results found for this or any previous visit (from the past 48 hour(s)). No results found.  Review of Systems General ROS: Negative Respiratory ROS: Negative Cardiovascular ROS: Negative Gastrointestinal ROS: Negative Genito-Urinary ROS:  Negative Musculoskeletal ROS: Positive for neck pain Neurological ROS: Positive for arm tingling Dermatological ROS: Negative Blood pressure (!) 131/94, pulse 95, temperature 98 F (36.7 C), resp. rate 18, SpO2 97 %. Physical Exam  General appearance: Alert, cooperative, in no acute distress Neck: Range of motion appears full  Neurologic exam:  Mental status: alertness: alert, affect: normal Speech: fluent and clear Motor:strength symmetric 5/5 in bilateral upper and lower extremities Sensory: Decreased light touch over the left hand, medial greater than lateral Reflexes: 2+ and symmetric bilaterally for biceps, negative Hoffmann's Gait: normal   Imaging: MRI cervical spine: There is straightening of the lordotic curvature. There are mild disc protrusions at multiple levels with mild degenerative disease except for C4-5 where there is a large disc protrusion that impact the spinal cord and causes compression on this. There is a normal alignment. Assessment/Plan Proceed with C4/5 ACDF  Lucy Chris, MD 10/27/2020, 10:50 AM

## 2020-10-27 NOTE — Discharge Instructions (Addendum)
°Your surgeon has performed an operation on your cervical spine (neck) to relieve pressure on the spinal cord and/or nerves. This involved making an incision in the front of your neck and removing one or more of the discs that support your spine. Next, a small piece of bone, a titanium plate, and screws were used to fuse two or more of the vertebrae (bones) together. ° °The following are instructions to help in your recovery once you have been discharged from the hospital. Even if you feel well, it is important that you follow these activity guidelines. If you do not let your neck heal properly from the surgery, you can increase the chance of return of your symptoms and other complications. ° °* Do not take anti-inflammatory medications for 3 months after surgery (naproxen [Aleve], ibuprofen [Advil, Motrin].). These medications can prevent your bones from healing properly. ° °Activity  °  °No bending, lifting, or twisting (“BLT”). Avoid lifting objects heavier than 10 pounds (gallon milk jug).  Where possible, avoid household activities that involve lifting, bending, reaching, pushing, or pulling such as laundry, vacuuming, grocery shopping, and childcare. Try to arrange for help from friends and family for these activities while your back heals. ° °Increase physical activity slowly as tolerated.  Taking short walks is encouraged, but avoid strenuous exercise. Do not jog, run, bicycle, lift weights, or participate in any other exercises unless specifically allowed by your doctor. ° °Talk to your doctor before resuming sexual activity. ° °You should not drive until cleared by your doctor. ° °Until released by your doctor, you should not return to work or school.  You should rest at home and let your body heal.  ° °You may shower three days after your surgery.  After showering, lightly dab your incision dry. Do not take a tub bath or go swimming until approved by your doctor at your follow-up appointment. ° °If your  doctor ordered a cervical collar (neck brace) for you, you should wear it whenever you are out of bed. You may remove it when lying down or sleeping, but you should wear it at all other times. Not all neck surgeries require a cervical collar. ° °If you smoke, we strongly recommend that you quit.  Smoking has been proven to interfere with normal bone healing and will dramatically reduce the success rate of your surgery. Please contact [facility] (800-QUIT-NOW) and use the resources at www.[facility].com for assistance in stopping smoking. ° °Surgical Incision °  °If you have a dressing on your incision, you may remove it two days after your surgery. Keep your incision area clean and dry. ° °If you have staples or stitches on your incision, you should have a follow up scheduled for removal. If you do not have staples or stitches, you will have steri-strips (small pieces of surgical tape) or Dermabond glue. The steri-strips/glue should begin to peel away within about a week (it is fine if the steri-strips fall off before then). If the strips are still in place one week after your surgery, you may gently remove them. ° °Diet          ° °You may return to your usual diet. However, you may experience discomfort when swallowing in the first month after your surgery. This is normal. You may find that softer foods are more comfortable for you to swallow. Be sure to stay hydrated. ° °When to Contact Us ° °You may experience pain in your neck and/or pain between your shoulder blades. This is   normal and should improve in the next few weeks with the help of pain medication, muscle relaxers, and rest. Some patients report that a warm compress on the back of the neck or between the shoulder blades helps. ° °However, should you experience any of the following, contact us immediately: °New numbness or weakness °Pain that is progressively getting worse, and is not relieved by your pain medication, muscle relaxers, rest, and warm  compresses °Bleeding, redness, swelling, pain, or drainage from surgical incision °Chills or flu-like symptoms °Fever greater than 101.0 F (38.3 C) °Inability to eat, drink fluids, or take medications °Problems with bowel or bladder functions °Difficulty breathing or shortness of breath °Warmth, tenderness, or swelling in your calf °Contact Information °During office hours (Monday-Friday 9 am to 5 pm), please call your physician at [phone] and ask for Kendelyn Jean °After hours and weekends, please call [phone] and speak with the answering service, who will contact the doctor on call.  If that fails, call the [facility] Operator at [phone] and ask for the Neurosurgery Resident On Call  °For a life-threatening emergency, call 911 °  °

## 2020-10-27 NOTE — Progress Notes (Signed)
Rapid Response Event Note   Reason for Call : [place]: Tachycardic, Increased work of breathing   Initial Focused Assessment: On my arrival pt has eyes closed and is tachypnic. HR 130s. BP elevated. Pt would not respond to questions but is very tearful. After a few minutes pt opened eyes and says she is having pain in her neck. RN states she had received oxycodone at 7:15pm.    Interventions: Once patient was opening eyes, neuro exam performed. Pt is oriented X4 but very anxious and remains tearful. Strength is normal and equal on both sides. Neurosurgery on call was paged. Pt is now breathing normal and HR is down to 120.    Plan of Care: Pt will remain on 1A at this time. Neurosurgery notified. Primary RN aware to contact ICU charge RN for any needs. This RN will follow up.    Event Summary:   MD Notified: Dr. Adriana Simas Call Time: 2050 Arrival Time: 2053 End Time: 2120  Henrene Dodge, RN

## 2020-10-28 ENCOUNTER — Encounter: Payer: Self-pay | Admitting: Neurosurgery

## 2020-10-28 DIAGNOSIS — M542 Cervicalgia: Secondary | ICD-10-CM | POA: Diagnosis not present

## 2020-10-28 LAB — BASIC METABOLIC PANEL
Anion gap: 8 (ref 5–15)
BUN: 10 mg/dL (ref 6–20)
CO2: 27 mmol/L (ref 22–32)
Calcium: 9.6 mg/dL (ref 8.9–10.3)
Chloride: 102 mmol/L (ref 98–111)
Creatinine, Ser: 0.8 mg/dL (ref 0.44–1.00)
GFR, Estimated: >60 mL/min (ref >60)
Glucose, Bld: 122 mg/dL — ABNORMAL HIGH (ref 70–99)
Potassium: 4.6 mmol/L (ref 3.5–5.1)
Sodium: 137 mmol/L (ref 135–145)

## 2020-10-28 LAB — CBC
HCT: 43.7 % (ref 36.0–46.0)
Hemoglobin: 14.8 g/dL (ref 12.0–15.0)
MCH: 28.7 pg (ref 26.0–34.0)
MCHC: 33.9 g/dL (ref 30.0–36.0)
MCV: 84.9 fL (ref 80.0–100.0)
Platelets: 244 10*3/uL (ref 150–400)
RBC: 5.15 MIL/uL — ABNORMAL HIGH (ref 3.87–5.11)
RDW: 13.8 % (ref 11.5–15.5)
WBC: 11 10*3/uL — ABNORMAL HIGH (ref 4.0–10.5)
nRBC: 0 % (ref 0.0–0.2)

## 2020-10-28 MED ORDER — KETOROLAC TROMETHAMINE 15 MG/ML IJ SOLN
15.0000 mg | Freq: Three times a day (TID) | INTRAMUSCULAR | Status: DC
  Administered 2020-10-28: 15 mg via INTRAVENOUS
  Filled 2020-10-28: qty 1

## 2020-10-28 MED ORDER — METHOCARBAMOL 500 MG PO TABS: 500.0000 mg | ORAL_TABLET | Freq: Four times a day (QID) | ORAL | 0 refills | Status: DC

## 2020-10-28 MED ORDER — TRAMADOL HCL 50 MG PO TABS: 50.0000 mg | ORAL_TABLET | Freq: Four times a day (QID) | ORAL | 0 refills | Status: DC | PRN

## 2020-10-28 MED ORDER — TRAMADOL HCL 50 MG PO TABS
50.0000 mg | ORAL_TABLET | Freq: Four times a day (QID) | ORAL | Status: DC
Start: 2020-10-28 — End: 2020-10-28
  Administered 2020-10-28: 50 mg via ORAL
  Filled 2020-10-28: qty 1

## 2020-10-28 MED ORDER — SENNA 8.6 MG PO TABS: 1.0000 | ORAL_TABLET | Freq: Two times a day (BID) | ORAL | 0 refills | Status: DC

## 2020-10-28 NOTE — Progress Notes (Signed)
    Attending Progress Note  History: Leslie Meadows is s/p C4-5 ACDF for cervical myelopathy  POD#1: trouble with swallowing this morning and an episode of altered mental status after oxycodone administration where she became emotional and was unable to speak.  Physical Exam: Vitals:   10/28/20 0421 10/28/20 0746  BP: 129/85 (!) 144/112  Pulse: 78 86  Resp: 18 17  Temp: (!) 97.5 F (36.4 C) 98.3 F (36.8 C)  SpO2: 100% 98%    AA Ox3 CNI  Strength:5/5 throughout.  Incision: c/d with dressing in place. No obvious swelling   Data:  Recent Labs  Lab 10/28/20 0909  NA 137  K 4.6  CL 102  CO2 27  BUN 10  CREATININE 0.80  GLUCOSE 122*  CALCIUM 9.6   No results for input(s): AST, ALT, ALKPHOS in the last 168 hours.  Invalid input(s): TBILI   Recent Labs  Lab 10/28/20 0909  WBC 11.0*  HGB 14.8  HCT 43.7  PLT 244   No results for input(s): APTT, INR in the last 168 hours.         Assessment/Plan:  Leslie Meadows is a 89 presenting with cervical myelopathy status post C4-5 ACDF.  - mobilize - AMS resolved with medication changes - pain control; will attempt Toradol and tramadol for pain control.  Oxycodone discontinued - DVT prophylaxis; SCDs - PTOT recommending a rolling walker - dispo planning    Venetia Night MD, [facility] Department of Neurosurgery

## 2020-10-28 NOTE — Anesthesia Postprocedure Evaluation (Signed)
Anesthesia Post Note  Patient: Leslie Meadows  Procedure(s) Performed: C4-5 ANTERIOR CERVICAL DECOMPRESSION/DISCECTOMY FUSION 1 LEVEL  Patient location during evaluation: PACU Anesthesia Type: General Level of consciousness: awake and alert Pain management: pain level controlled Vital Signs Assessment: post-procedure vital signs reviewed and stable Respiratory status: spontaneous breathing, nonlabored ventilation, respiratory function stable and patient connected to nasal cannula oxygen Cardiovascular status: blood pressure returned to baseline and stable Postop Assessment: no apparent nausea or vomiting Anesthetic complications: no   No notable events documented.   Last Vitals:  Vitals:   10/28/20 0421 10/28/20 0746  BP: 129/85 (!) 144/112  Pulse: 78 86  Resp: 18 17  Temp: (!) 36.4 C 36.8 C  SpO2: 100% 98%    Last Pain:  Vitals:   10/28/20 0212  TempSrc:   PainSc: Asleep                 Cleda Mccreedy Delma Villalva

## 2020-10-28 NOTE — TOC Progression Note (Signed)
Transition of Care [facility]) - Progression Note    Patient Details  Name: Leslie Meadows MRN: [id number] Date of Birth: 1970-05-04  Transition of Care [facility]) CM/SW [place], RN Phone Number: 10/28/2020, 9:52 AM  Clinical Narrative:   Met with the patient at the bedside She has a workman's comp adjuster Carren Rang [phone], she does not know the claim number, PT will evaluate the patient, TOC will monitor for needs         Expected Discharge Plan and Services                                                 Social Determinants of Health (SDOH) Interventions    Readmission Risk Interventions No flowsheet data found.

## 2020-10-28 NOTE — Evaluation (Signed)
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 Pt underwent C4/5 ACDF on 9/19 c Dr. Lucy Chris. Evening after surgery RR called due to elevated HR 130s, increased RR, pt very anxious regarding neck pain.   Clinical Impression   Pt seen for OT evaluation this date, POD#1 from C4/5 ACDF.  Prior to hospital admission, pt was independent with mobility, ADL, and IADL. Endorses 1 fall this past Sunday while getting in/out of shower. Pt lives with her children in a single family home with ramped entrance + partial step and tub/shower. Family able to provide 24/7 assist/support as needed for pt. Currently pt requires PRN MIN  A for UB/LB dressing and bathing. Pt endorsing 10/10 cervical pain. Pt/family educated in cervical guidelines and positioning, self care skills, AE/DME, and home/routines modifications to maximize safety and functional independence while minimizing falls risk and maintaining precautions. Handout provided to support recall and carryover. Pt verbalized understanding and will benefit from additional skilled OT Services to maximize return to PLOF.    Recommendations for follow up therapy are one component of a multi-disciplinary discharge planning process, led by the attending physician.  Recommendations may be updated based on patient status, additional functional criteria and insurance authorization.   Follow Up Recommendations  No OT follow up    Equipment Recommendations  3 in 1 bedside commode;Other (comment);Tub/shower seat (reacher, LH sponge, handheld shower head)    Recommendations for Other Services       Precautions / Restrictions Precautions Precautions: Fall Restrictions Weight Bearing Restrictions: No Other Position/Activity  Restrictions: no brace required per MD order. Pt reports requesting a soft collar for overnight wear to minimize pain/discomfort and suboptimal cervical positioning while asleep.      Mobility Bed Mobility Overal bed mobility: Modified Independent             General bed mobility comments: HOB up - pt educated in bed mobility from flat bed and from bed with HOB elevated    Transfers Deferred 2/2 10/10 pain.              Balance Overall balance assessment: Mild deficits observed, not formally tested                                         ADL either performed or assessed with clinical judgement   ADL                                         General ADL Comments: Pt requires PRN MIN A for LB ADL 2/2 cervical pain with bending. Pt educated in positioning and adaptive equipment and strategies for LB and UB ADL Tasks. Handout provided.     Vision         Perception     Praxis      Pertinent Vitals/Pain Pain Assessment: 0-10 Pain Score: 10-Worst pain ever Pain Location: cervical incision Pain Descriptors / Indicators: Aching Pain Intervention(s): Limited activity within patient's tolerance;Monitored during session;Repositioned (RN aware)     Hand Dominance     Extremity/Trunk Assessment Upper Extremity Assessment Upper Extremity Assessment:  (  Grossly WFL except grip strength deficits)   Lower Extremity Assessment Lower Extremity Assessment: Overall WFL for tasks assessed       Communication Communication Communication: No difficulties   Cognition Arousal/Alertness: Awake/alert Behavior During Therapy: WFL for tasks assessed/performed Overall Cognitive Status: Within Functional Limits for tasks assessed                                     General Comments       Exercises Other Exercises Other Exercises: Pt educated in cervical positioning, falls prevention, home/routines modifications, AE/DME for  bathing, dressing, and bed mobility; handout provided to support recall and carryover   Shoulder Instructions      Home Living Family/patient expects to be discharged to:: Private residence Living Arrangements: Children (DTR, Son) Available Help at Discharge: Available 24 hours/day;Family Type of Home: Apartment Home Access: Stairs to enter [employer] of Steps: ramp and partial step at doorway   Home Layout: One level     Bathroom Shower/Tub: Chief Strategy Officer: Standard     Home Equipment: None          Prior Functioning/Environment Level of Independence: Independent        Comments: Pt endorses fall this past Sunday while getting in/out of shower.        OT Problem List: Decreased strength;Pain;Decreased range of motion;Decreased knowledge of use of DME or AE;Decreased knowledge of precautions      OT Treatment/Interventions: Self-care/ADL training;Therapeutic activities;Therapeutic exercise;DME and/or AE instruction;Patient/family education    OT Goals(Current goals can be found in the care plan section) Acute Rehab OT Goals Patient Stated Goal: minimize pain and get better OT Goal Formulation: With patient/family Time For Goal Achievement: 11/11/20 Potential to Achieve Goals: Good ADL Goals Pt Will Perform Lower Body Dressing: with modified independence;sit to/from stand (AE PRN) Pt Will Transfer to Toilet: with modified independence;ambulating (elevated commode, LRAD PRN) Additional ADL Goal #1: Pt will perform bed mobility with modified independence, elevating HOB as needed, while maintaining optimal posture and cervical positioning in order to minimize pain. Additional ADL Goal #2: Pt will perform seated sponge bath with AE PRN and maintaining optimal cervical positioning.  OT Frequency: Min 2X/week   Barriers to D/C:            Co-evaluation              AM-PAC OT "6 Clicks" Daily Activity     Outcome Measure Help  from another person eating meals?: None Help from another person taking care of personal grooming?: None Help from another person toileting, which includes using toliet, bedpan, or urinal?: None Help from another person bathing (including washing, rinsing, drying)?: A Little Help from another person to put on and taking off regular upper body clothing?: A Little Help from another person to put on and taking off regular lower body clothing?: A Little 6 Click Score: 21   End of Session Nurse Communication: Patient requests pain meds (per pt, RN already aware, going to bring soon)  Activity Tolerance: Patient limited by pain Patient left: in bed;with call bell/phone within reach;with bed alarm set;with family/visitor present  OT Visit Diagnosis: Other abnormalities of gait and mobility (R26.89);History of falling (Z91.81);Pain Pain - Right/Left:  (cervical)                Time: 1320-1340 OT Time Calculation (min): 20 min Charges:  OT General Charges $  OT Visit: 1 Visit OT Evaluation $OT Eval Low Complexity: 1 Low OT Treatments $Self Care/Home Management : 8-22 mins  Arman Filter., MPH, MS, OTR/L ascom [phone] 10/28/20, 1:58 PM

## 2020-10-28 NOTE — Discharge Summary (Signed)
Physician Discharge Summary  Patient ID: Leslie Meadows MRN: [id number] DOB/AGE: 1970/08/21 50 y.o.  Admit date: 10/27/2020 Discharge date: 10/28/2020  Admission Diagnoses: Cervical myelopathy  Discharge Diagnoses:  Active Problems:   Cervical myelopathy [facility])   Discharged Condition: good  Hospital Course: Leslie Meadows is a 50 y.o admitted postoperatively after a C4 ACDF done for cervical myelopathy.  Her intraoperative course was uncomplicated and she was admitted overnight for further monitoring and pain control.  She did experience some difficulty after administration of oxycodone however this resolved with medication changes.  Physical therapy and Occupational Therapy were consulted for further evaluation and recommended a rolling walker.  She was ultimately discharged home on postop day 1 after ambulating, urinating, and tolerating p.o. intake with p.o. medications for pain.  Consults: None  Significant Diagnostic Studies: None   Treatments: surgery: See above  Discharge Exam: Blood pressure (!) 144/112, pulse 86, temperature 98.3 F (36.8 C), resp. rate 17, SpO2 98 %. CN II-XII grossly intact  5/5 strength in bilateral upper extremities Incision: Clean with dressing in place.   Disposition: Discharge disposition: 01-Home or Self Care      Discharge Instructions     Incentive spirometry RT   Complete by: As directed    Remove dressing in 24 hours   Complete by: As directed       Allergies as of 10/28/2020   No Known Allergies      Medication List     TAKE these medications    cholecalciferol 25 MCG (1000 UNIT) tablet Commonly known as: VITAMIN D3 Take 1,000 Units by mouth daily.   levothyroxine 25 MCG tablet Commonly known as: SYNTHROID Take 12.5 mcg by mouth daily before breakfast.   methocarbamol 500 MG tablet Commonly known as: ROBAXIN Take 1 tablet (500 mg total) by mouth every 6 (six) hours.   NON FORMULARY Pt uses a cpap nightly    rosuvastatin 40 MG tablet Commonly known as: CRESTOR Take 40 mg by mouth at bedtime.   senna 8.6 MG Tabs tablet Commonly known as: SENOKOT Take 1 tablet (8.6 mg total) by mouth 2 (two) times daily.   traMADol 50 MG tablet Commonly known as: ULTRAM Take 1 tablet (50 mg total) by mouth every 6 (six) hours as needed for up to 5 days.               Durable Medical Equipment  (From admission, onward)           Start     Ordered   10/28/20 1340  For home use only DME Walker rolling  Once       Question Answer Comment  Walker: With 5 Inch Wheels   Patient needs a walker to treat with the following condition Weakness      10/28/20 1340            Follow-up Information     Susanne Borders, PA Follow up in 2 week(s).   Why: For incision check. Appointment is already scheduled at [facility] please call if any questions or concerns regarding appointment date and time Contact information: [address] [place] [address] [phone]                 Signed: Deshundra Waller 10/28/2020, 2:38 PM

## 2020-10-28 NOTE — Evaluation (Signed)
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 Pt underwent C4/5 ACDF on 9/19 c Dr. Lucy Chris. Evening after surgery RR called due to elevated HR 130s, increased RR, pt very anxious regarding neck pain.  Clinical Impression  Pt admitted with above diagnosis. Pt currently with functional limitations due to the deficits listed below (see "PT Problem List"). Upon entry, pt in bed, awake and agreeable to participate. The pt is alert, pleasant, interactive, and able to provide info regarding prior level of function, both in tolerance and independence. Pt able to perform all mobility without physical assist, but is limited in speed by dizziness upon sitting up. Pt has progressive improvement in gait stability, gait speed, and confidence over 459ft of AMB. Pt would like a RW for DC due to ongoing unsteadiness. Patient's performance this date reveals decreased ability, independence, and tolerance in performing all basic mobility required for performance of activities of daily living. Pt requires additional DME, close physical assistance, and cues for safe participate in mobility. Pt will benefit from skilled PT intervention to increase independence and safety with basic mobility in preparation for discharge to the venue listed below.          Recommendations for follow up therapy are one component of a multi-disciplinary discharge planning process, led by the attending physician.  Recommendations may be updated based on patient status, additional functional criteria and insurance authorization.  Follow Up Recommendations Follow surgeon's recommendation for DC plan and follow-up therapies;Supervision - Intermittent    Equipment Recommendations  Rolling walker with 5" wheels     Recommendations for Other Services       Precautions / Restrictions Precautions Precautions: Fall Restrictions Weight Bearing Restrictions: No      Mobility  Bed Mobility Overal bed mobility: Modified Independent             General bed mobility comments: HOB up    Transfers Overall transfer level: Needs assistance Equipment used: None Transfers: Sit to/from Stand Sit to Stand: Supervision            Ambulation/Gait Ambulation/Gait assistance: Supervision;Min guard Gait Distance (Feet): 420 Feet Assistive device: None   Gait velocity: 0.32m/s   General Gait Details: slow, unsteady at times, rail intermittently. no assist needed for righting  Stairs            Wheelchair Mobility    Modified Rankin (Stroke Patients Only)       Balance Overall balance assessment: Mild deficits observed, not formally tested;Modified Independent                                           Pertinent Vitals/Pain Pain Assessment: 0-10 Pain Score: 10-Worst pain ever Pain Location: posteriro central neck and Left infraclavicular fossa Pain Intervention(s): Limited activity within patient's tolerance;Monitored during session;Premedicated before session    Home Living Family/patient expects to be discharged to:: Private residence Living Arrangements: Children (DTR, Son) Available Help at Discharge: Available 24 hours/day;Family Type of Home: Apartment Home Access: Stairs to enter   [employer] of Steps: ramp and partial step at doorway Home Layout: One level Home Equipment: None      Prior Function Level of Independence: Independent  Hand Dominance        Extremity/Trunk Assessment   Upper Extremity Assessment Upper Extremity Assessment: Overall WFL for tasks assessed (min-mod grasp weekness, unchanged since prior to procedure)    Lower Extremity Assessment Lower Extremity Assessment: Overall WFL for  tasks assessed       Communication      Cognition Arousal/Alertness: Awake/alert Behavior During Therapy: WFL for tasks assessed/performed Overall Cognitive Status: Within Functional Limits for tasks assessed                                        General Comments      Exercises     Assessment/Plan    PT Assessment Patient needs continued PT services  PT Problem List Decreased balance;Decreased mobility       PT Treatment Interventions DME instruction;Balance training;Gait training;Stair training;Functional mobility training;Therapeutic activities;Therapeutic exercise;Patient/family education    PT Goals (Current goals can be found in the Care Plan section)  Acute Rehab PT Goals Patient Stated Goal: improve balance and reduce neck pain PT Goal Formulation: With patient Time For Goal Achievement: 11/11/20 Potential to Achieve Goals: Fair    Frequency 7X/week   Barriers to discharge        Co-evaluation               AM-PAC PT "6 Clicks" Mobility  Outcome Measure Help needed turning from your back to your side while in a flat bed without using bedrails?: None Help needed moving from lying on your back to sitting on the side of a flat bed without using bedrails?: None Help needed moving to and from a bed to a chair (including a wheelchair)?: None Help needed standing up from a chair using your arms (e.g., wheelchair or bedside chair)?: None Help needed to walk in hospital room?: None Help needed climbing 3-5 steps with a railing? : A Little 6 Click Score: 23    End of Session Equipment Utilized During Treatment: Gait belt Activity Tolerance: Patient tolerated treatment well;No increased pain Patient left: in chair;with family/visitor present;with call bell/phone within reach Nurse Communication: Mobility status PT Visit Diagnosis: Unsteadiness on feet (R26.81);Difficulty in walking, not elsewhere classified (R26.2)    Time: 1025-8527 PT  Time Calculation (min) (ACUTE ONLY): 33 min   Charges:   PT Evaluation $PT Eval Low Complexity: 1 Low PT Treatments $Neuromuscular Re-education: 8-22 mins       12:25 PM, 10/28/20 Rosamaria Lints, PT, DPT Physical Therapist - [facility]  [phone] (ASCOM)    Annisten Manchester C 10/28/2020, 12:21 PM

## 2020-10-28 NOTE — Progress Notes (Signed)
States feels Shaky. CBG 197. Anxiety noted with increase in HR , RR   10/27/20 2045  Assess: MEWS Score  Temp 98.2 F (36.8 C)  BP (!) 162/108 (Notified RN Melissaann Dizdarevic of Pt BP)  Pulse Rate (!) 125 (Notified RN Keatyn Jawad of Pt HR)  Resp (!) 31 (Notified RN Kiran Carline of Pt RR)  Level of Consciousness Alert  SpO2 100 %  O2 Device Nasal Cannula  O2 Flow Rate (L/min) 2 L/min  Assess: MEWS Score  MEWS Temp 0  MEWS Systolic 0  MEWS Pulse 2  MEWS RR 2  MEWS LOC 0  MEWS Score 4  MEWS Score Color Red  Assess: if the MEWS score is Yellow or Red  Were vital signs taken at a resting state? Yes  Focused Assessment Change from prior assessment (see assessment flowsheet)  Does the patient meet 2 or more of the SIRS criteria? Yes  Does the patient have a confirmed or suspected source of infection? No  MEWS guidelines implemented *See Row Information* Yes  Treat  MEWS Interventions Administered prn meds/treatments;Administered scheduled meds/treatments;Escalated (See documentation below)  Pain Scale 0-10  Pain Score 5  Pain Type Surgical pain  Pain Location Neck  Pain Intervention(s) Emotional support;Repositioned  Take Vital Signs  Increase Vital Sign Frequency  Red: Q 1hr X 4 then Q 4hr X 4, if remains red, continue Q 4hrs  Escalate  MEWS: Escalate Red: discuss with charge nurse/RN and provider, consider discussing with RRT  Notify: Charge Nurse/RN  Name of Charge Nurse/RN Notified Willicynt  Date Charge Nurse/RN Notified 10/27/20  Time Charge Nurse/RN Notified 2100  Notify: Provider  Provider Name/Title Dr Adriana Simas  Date Provider Notified 10/27/20  Time Provider Notified 2130  Notification Type Call  Notification Reason Change in status (Increase in HR and Increase in BP)  Provider response See new orders  Date of Provider Response 10/27/20  Time of Provider Response 2130  Notify: Rapid Response  Date Rapid Response Notified 10/27/20  Time Rapid Response Notified 2100  Document  Patient  Outcome Stabilized after interventions  Progress note created (see row info) Yes  Assess: SIRS CRITERIA  SIRS Temperature  0  SIRS Pulse 1  SIRS Respirations  1  SIRS WBC 0  SIRS Score Sum  2  and Increase in BP. Skin warm, dry and pink. No other c/o other than posterior neck pain. Received oxycodone at 1905. Refusing IV morphine. No acute distress noted.

## 2020-10-28 NOTE — TOC Progression Note (Signed)
Transition of Care [facility]) - Progression Note    Patient Details  Name: Leslie Meadows MRN: [id number] Date of Birth: 09-12-1970  Transition of Care [facility]) CM/SW Contact  Barrie Dunker, RN Phone Number: 10/28/2020, 1:26 PM  Clinical Narrative:    Coralyn Pear out to Levada Dy the adjuster for [employer], She is out of the office until the 23rd, I called [phone] option 1, option 1, option 2, claim number [id number]  request is to be   faxed  by [employer] [phone] for approval, will be delivered to the patient's home once approved by Venetia Constable        Expected Discharge Plan and Services                                                 Social Determinants of Health (SDOH) Interventions    Readmission Risk Interventions No flowsheet data found.

## 2020-10-29 ENCOUNTER — Observation Stay: Payer: Worker's Compensation

## 2020-10-29 ENCOUNTER — Encounter: Payer: Self-pay | Admitting: Internal Medicine

## 2020-10-29 ENCOUNTER — Emergency Department: Payer: Worker's Compensation

## 2020-10-29 ENCOUNTER — Other Ambulatory Visit: Payer: Self-pay

## 2020-10-29 ENCOUNTER — Inpatient Hospital Stay
Admission: EM | Admit: 2020-10-29 | Discharge: 2020-11-02 | DRG: 552 | Disposition: A | Discharge status: Home Health | Payer: Worker's Compensation | Attending: Internal Medicine | Admitting: Internal Medicine

## 2020-10-29 DIAGNOSIS — D696 Thrombocytopenia, unspecified: Secondary | ICD-10-CM | POA: Diagnosis present

## 2020-10-29 DIAGNOSIS — R531 Weakness: Secondary | ICD-10-CM | POA: Diagnosis present

## 2020-10-29 DIAGNOSIS — R079 Chest pain, unspecified: Secondary | ICD-10-CM | POA: Diagnosis present

## 2020-10-29 DIAGNOSIS — Z6841 Body Mass Index (BMI) 40.0 and over, adult: Secondary | ICD-10-CM

## 2020-10-29 DIAGNOSIS — R1310 Dysphagia, unspecified: Secondary | ICD-10-CM | POA: Diagnosis present

## 2020-10-29 DIAGNOSIS — Z79899 Other long term (current) drug therapy: Secondary | ICD-10-CM

## 2020-10-29 DIAGNOSIS — G4730 Sleep apnea, unspecified: Secondary | ICD-10-CM

## 2020-10-29 DIAGNOSIS — R569 Unspecified convulsions: Secondary | ICD-10-CM

## 2020-10-29 DIAGNOSIS — Z79890 Hormone replacement therapy (postmenopausal): Secondary | ICD-10-CM

## 2020-10-29 DIAGNOSIS — R7303 Prediabetes: Secondary | ICD-10-CM | POA: Diagnosis present

## 2020-10-29 DIAGNOSIS — K5900 Constipation, unspecified: Secondary | ICD-10-CM | POA: Diagnosis present

## 2020-10-29 DIAGNOSIS — Z7982 Long term (current) use of aspirin: Secondary | ICD-10-CM

## 2020-10-29 DIAGNOSIS — Z9989 Dependence on other enabling machines and devices: Secondary | ICD-10-CM

## 2020-10-29 DIAGNOSIS — E785 Hyperlipidemia, unspecified: Secondary | ICD-10-CM | POA: Diagnosis present

## 2020-10-29 DIAGNOSIS — R519 Headache, unspecified: Secondary | ICD-10-CM | POA: Diagnosis present

## 2020-10-29 DIAGNOSIS — K760 Fatty (change of) liver, not elsewhere classified: Secondary | ICD-10-CM | POA: Diagnosis present

## 2020-10-29 DIAGNOSIS — Z803 Family history of malignant neoplasm of breast: Secondary | ICD-10-CM

## 2020-10-29 DIAGNOSIS — E039 Hypothyroidism, unspecified: Secondary | ICD-10-CM | POA: Diagnosis present

## 2020-10-29 DIAGNOSIS — M542 Cervicalgia: Principal | ICD-10-CM | POA: Diagnosis present

## 2020-10-29 DIAGNOSIS — Z20822 Contact with and (suspected) exposure to covid-19: Secondary | ICD-10-CM | POA: Diagnosis present

## 2020-10-29 DIAGNOSIS — G4733 Obstructive sleep apnea (adult) (pediatric): Secondary | ICD-10-CM | POA: Diagnosis present

## 2020-10-29 DIAGNOSIS — D7389 Other diseases of spleen: Secondary | ICD-10-CM | POA: Diagnosis present

## 2020-10-29 DIAGNOSIS — G959 Disease of spinal cord, unspecified: Secondary | ICD-10-CM | POA: Diagnosis present

## 2020-10-29 DIAGNOSIS — Z90710 Acquired absence of both cervix and uterus: Secondary | ICD-10-CM

## 2020-10-29 HISTORY — DX: Chest pain, unspecified: R07.9

## 2020-10-29 LAB — URINE DRUG SCREEN, QUALITATIVE (ARMC ONLY)
Amphetamines, Ur Screen: NOT DETECTED
Barbiturates, Ur Screen: NOT DETECTED
Benzodiazepine, Ur Scrn: POSITIVE — AB
Cannabinoid 50 Ng, Ur ~~LOC~~: NOT DETECTED
Cocaine Metabolite,Ur ~~LOC~~: NOT DETECTED
MDMA (Ecstasy)Ur Screen: NOT DETECTED
Methadone Scn, Ur: NOT DETECTED
Opiate, Ur Screen: NOT DETECTED
Phencyclidine (PCP) Ur S: NOT DETECTED
Tricyclic, Ur Screen: NOT DETECTED

## 2020-10-29 LAB — COMPREHENSIVE METABOLIC PANEL
ALT: 20 U/L (ref 0–44)
AST: 23 U/L (ref 15–41)
Albumin: 4.1 g/dL (ref 3.5–5.0)
Alkaline Phosphatase: 52 U/L (ref 38–126)
Anion gap: 9 (ref 5–15)
BUN: 20 mg/dL (ref 6–20)
CO2: 27 mmol/L (ref 22–32)
Calcium: 9.5 mg/dL (ref 8.9–10.3)
Chloride: 102 mmol/L (ref 98–111)
Creatinine, Ser: 1.01 mg/dL — ABNORMAL HIGH (ref 0.44–1.00)
GFR, Estimated: >60 mL/min (ref >60)
Glucose, Bld: 107 mg/dL — ABNORMAL HIGH (ref 70–99)
Potassium: 3.8 mmol/L (ref 3.5–5.1)
Sodium: 138 mmol/L (ref 135–145)
Total Bilirubin: 0.6 mg/dL (ref 0.3–1.2)
Total Protein: 7.8 g/dL (ref 6.5–8.1)

## 2020-10-29 LAB — ETHANOL: Alcohol, Ethyl (B): <10 mg/dL (ref <10)

## 2020-10-29 LAB — CBC WITH DIFFERENTIAL/PLATELET
Abs Immature Granulocytes: 0.07 10*3/uL (ref 0.00–0.07)
Basophils Absolute: 0 10*3/uL (ref 0.0–0.1)
Basophils Relative: 0 %
Eosinophils Absolute: 0 10*3/uL (ref 0.0–0.5)
Eosinophils Relative: 0 %
HCT: 43.8 % (ref 36.0–46.0)
Hemoglobin: 14.6 g/dL (ref 12.0–15.0)
Immature Granulocytes: 1 %
Lymphocytes Relative: 36 %
Lymphs Abs: 2.9 10*3/uL (ref 0.7–4.0)
MCH: 27.9 pg (ref 26.0–34.0)
MCHC: 33.3 g/dL (ref 30.0–36.0)
MCV: 83.6 fL (ref 80.0–100.0)
Monocytes Absolute: 0.5 10*3/uL (ref 0.1–1.0)
Monocytes Relative: 6 %
Neutro Abs: 4.6 10*3/uL (ref 1.7–7.7)
Neutrophils Relative %: 57 %
Platelets: 234 10*3/uL (ref 150–400)
RBC: 5.24 MIL/uL — ABNORMAL HIGH (ref 3.87–5.11)
RDW: 14.2 % (ref 11.5–15.5)
WBC: 8.1 10*3/uL (ref 4.0–10.5)
nRBC: 0 % (ref 0.0–0.2)

## 2020-10-29 LAB — RESP PANEL BY RT-PCR (FLU A&B, COVID) ARPGX2
Influenza A by PCR: NEGATIVE
Influenza B by PCR: NEGATIVE
SARS Coronavirus 2 by RT PCR: NEGATIVE

## 2020-10-29 LAB — POC URINE PREG, ED: Preg Test, Ur: NEGATIVE

## 2020-10-29 LAB — TROPONIN I (HIGH SENSITIVITY)
Troponin I (High Sensitivity): 5 ng/L (ref <18)
Troponin I (High Sensitivity): 4 ng/L (ref <18)
Troponin I (High Sensitivity): 4 ng/L (ref <18)

## 2020-10-29 LAB — MAGNESIUM: Magnesium: 2.2 mg/dL (ref 1.7–2.4)

## 2020-10-29 LAB — SALICYLATE LEVEL: Salicylate Lvl: <7 mg/dL — ABNORMAL LOW (ref 7.0–30.0)

## 2020-10-29 LAB — ACETAMINOPHEN LEVEL: Acetaminophen (Tylenol), Serum: <10 ug/mL — ABNORMAL LOW (ref 10–30)

## 2020-10-29 LAB — CBG MONITORING, ED: Glucose-Capillary: 93 mg/dL (ref 70–99)

## 2020-10-29 IMAGING — CT CT HEAD W/O CM
3 series · 16 of 47 positions shown, 19 images · non-contrast
Comparison: None.

CLINICAL DATA: Seizure.

EXAM:
CT HEAD WITHOUT CONTRAST
TECHNIQUE: Contiguous axial images were obtained from the base of the skull
through the vertex without intravenous contrast.

[Series 2: head wo · axial · 0.45mm/px · z∈[-100,+50]mm · 10 of 36 slices shown, 13 images]
[im 3/36  brain]
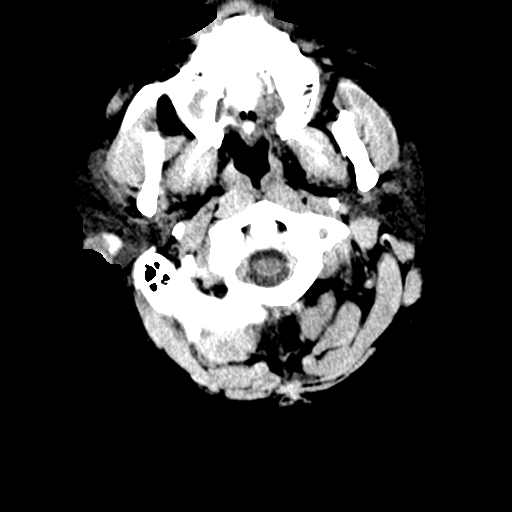
[im 3/36  bone]
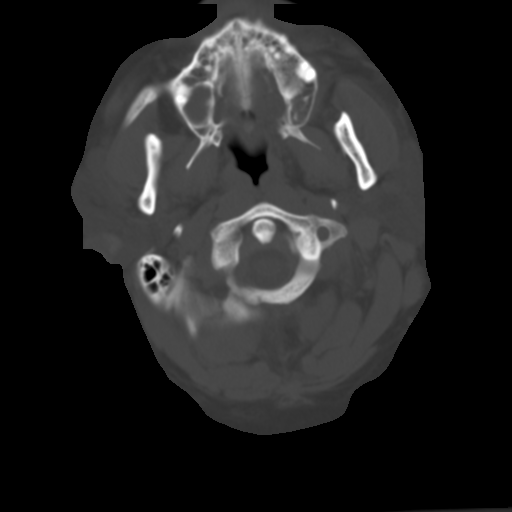
[im 7/36  brain]
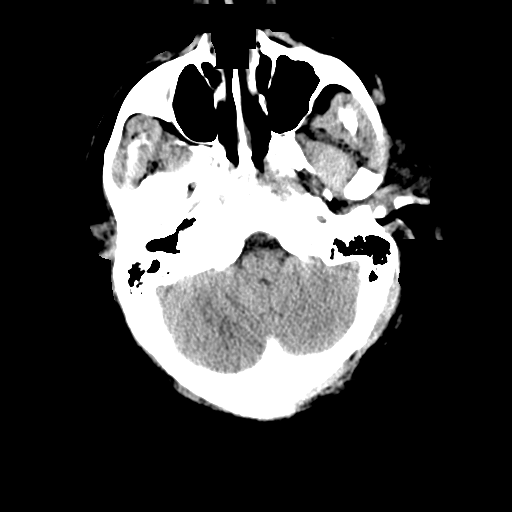
[im 10/36  brain]
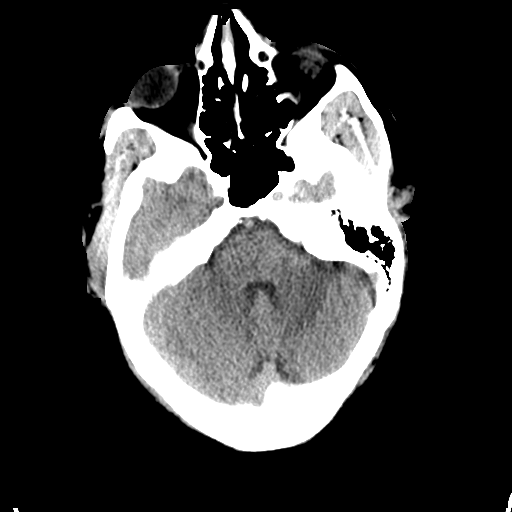
[im 13/36  brain]
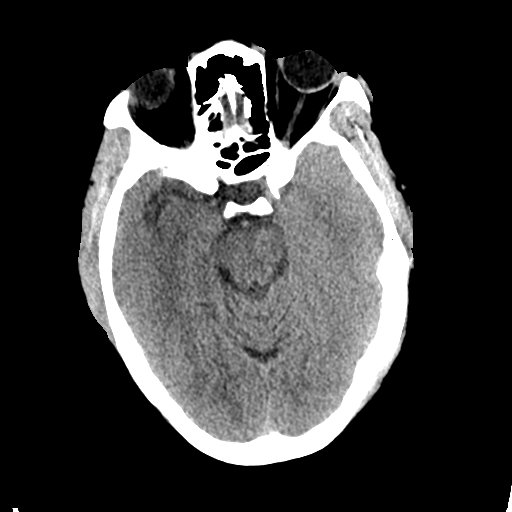
[im 16/36  brain]
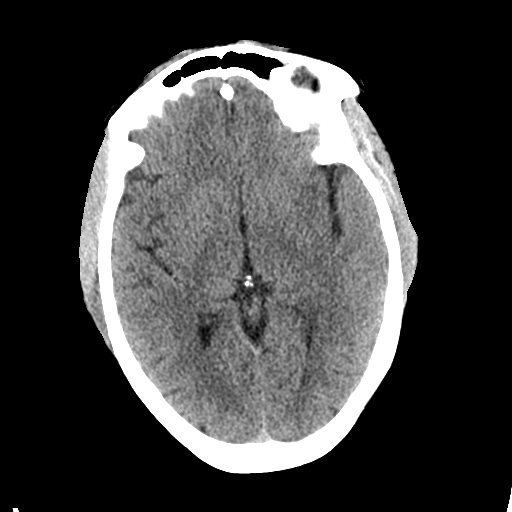
[im 16/36  bone]
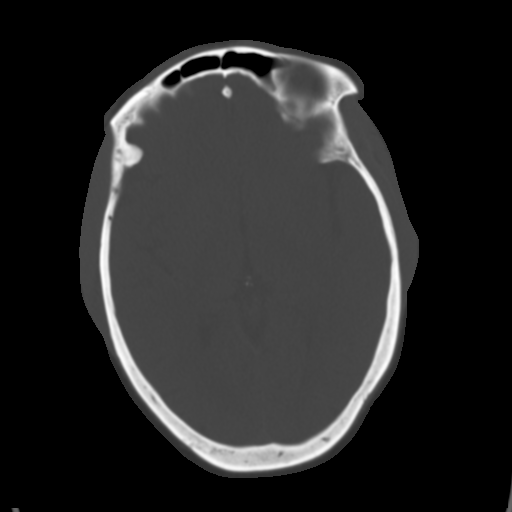
[im 20/36  brain]
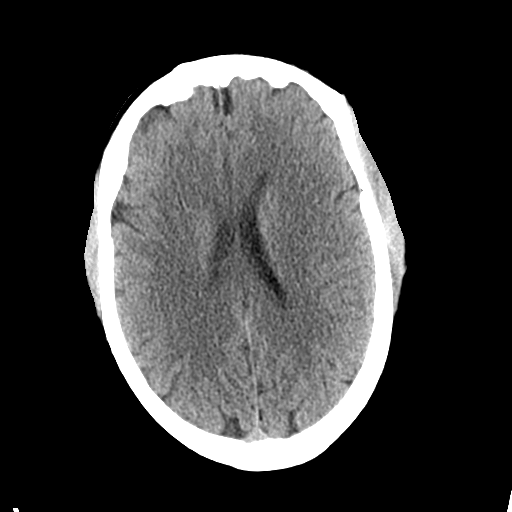
[im 23/36  brain]
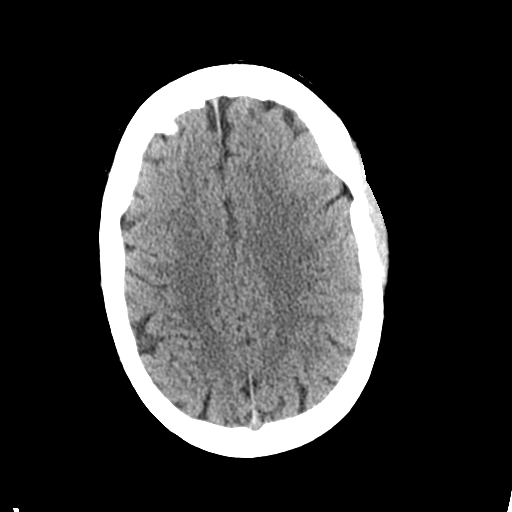
[im 27/36  brain]
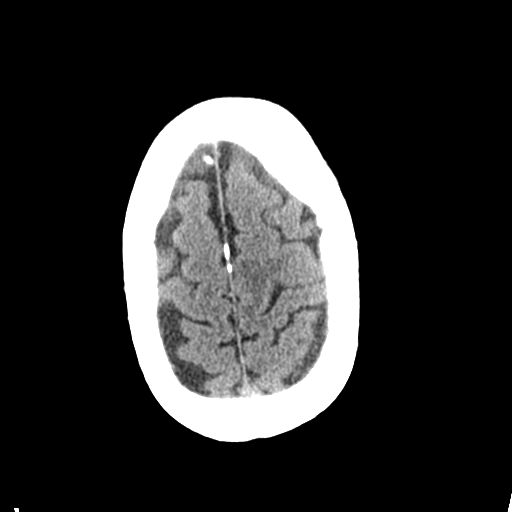
[im 29/36  brain]
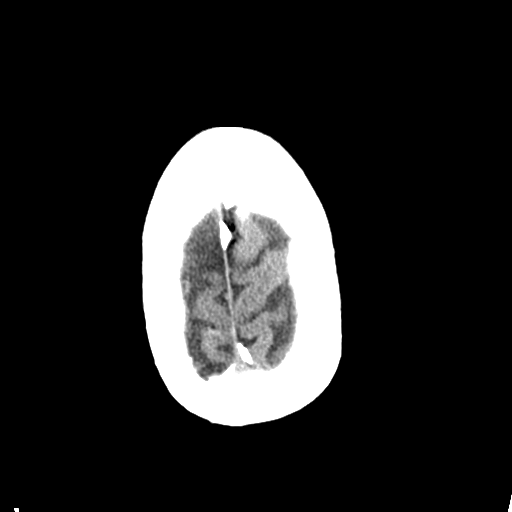
[im 29/36  bone]
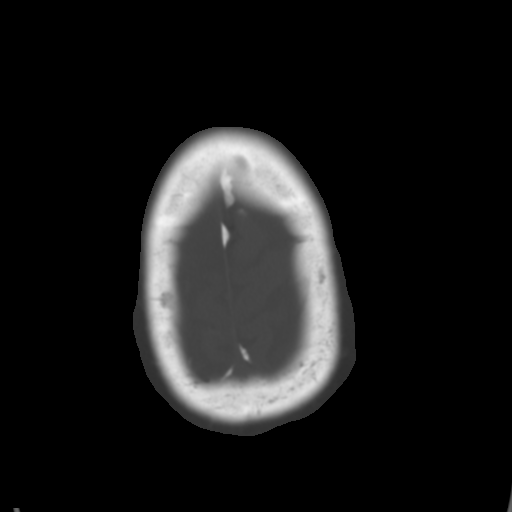
[im 33/36  brain]
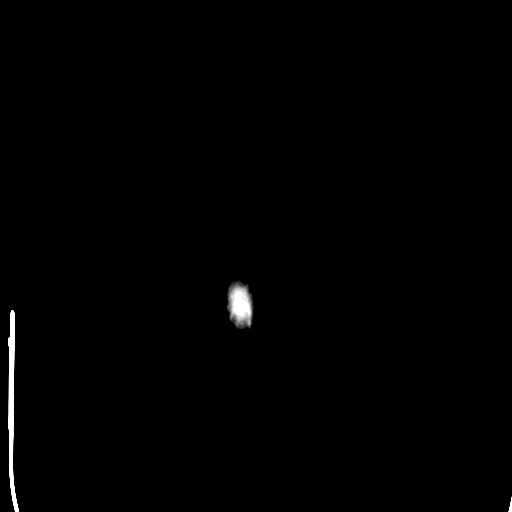

[Series 4: coronal soft tissue · coronal · 0.35mm/px · 3 of 77 slices shown]
[im 26/77  brain]
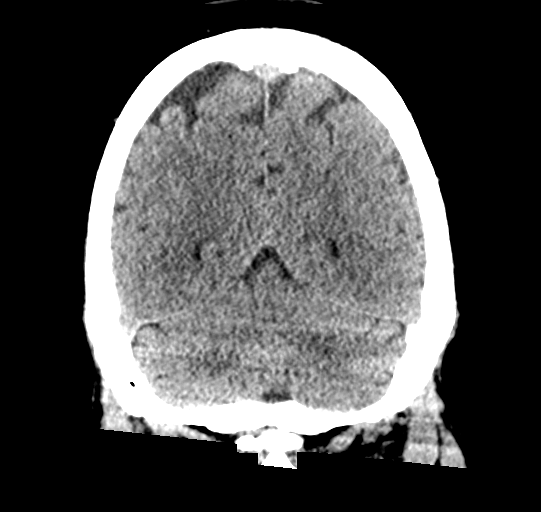
[im 34/77  brain]
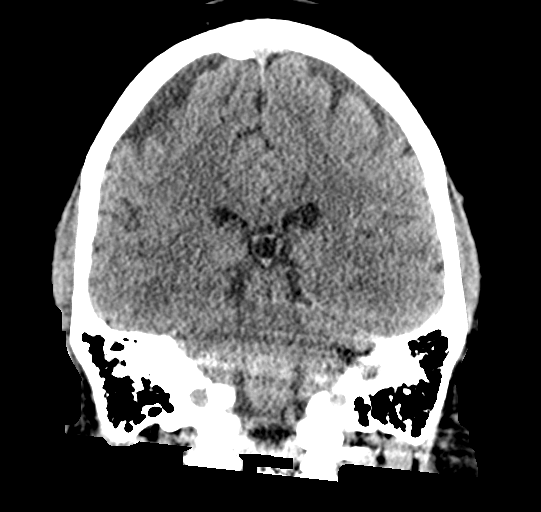
[im 43/77  brain]
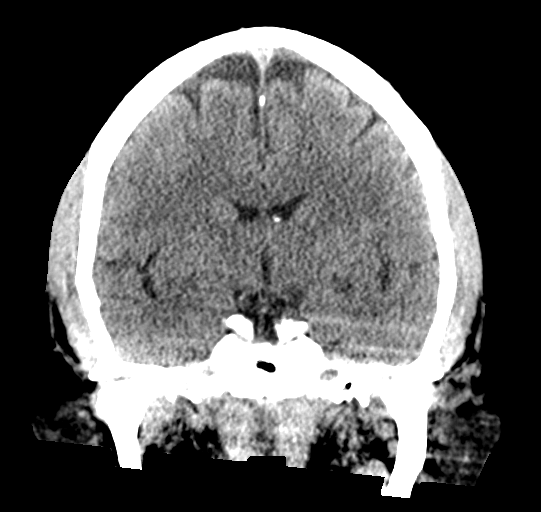

[Series 5: sagittal soft tissue · sagittal · 0.37mm/px · 3 of 63 slices shown]
[im 23/63  brain]
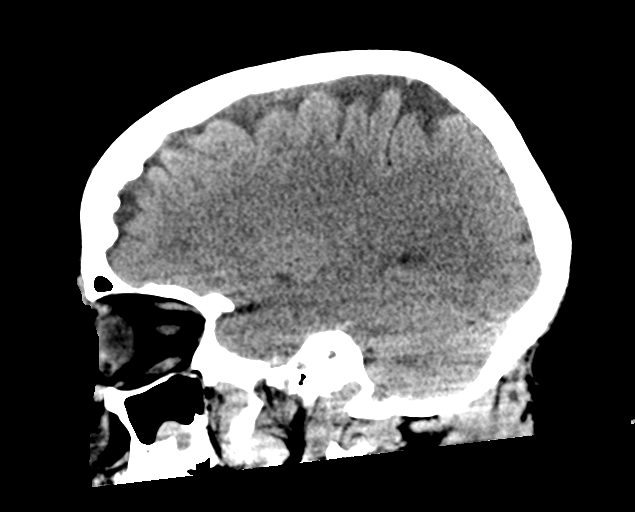
[im 32/63  brain]
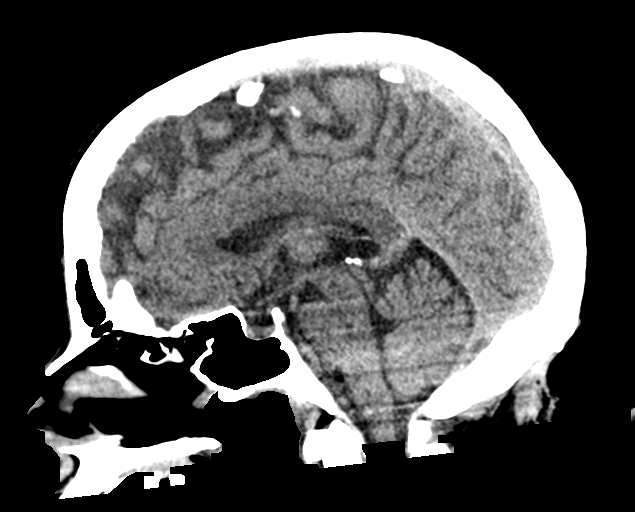
[im 41/63  brain]
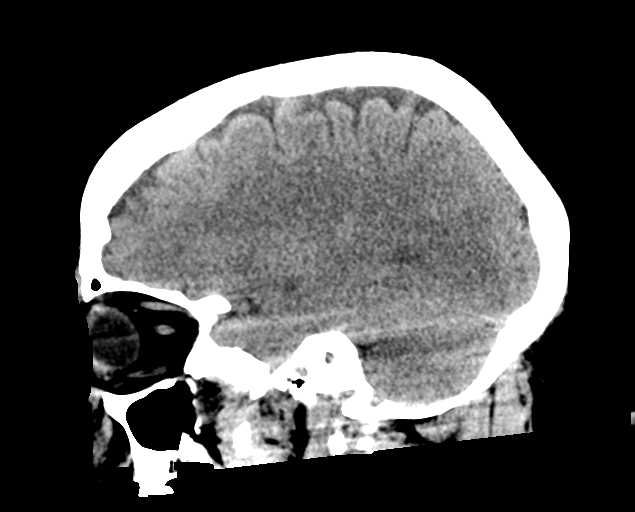

[16 of 47 positions shown; findings below may reference images not displayed]

FINDINGS: Brain: No evidence of acute infarction, hemorrhage, hydrocephalus,
extra-axial collection or mass lesion/mass effect.

Vascular: No hyperdense vessel or unexpected calcification.

Skull: Normal. Negative for fracture or focal lesion.

Sinuses/Orbits: Retention cyst versus polyp noted in the right
maxillary sinus.

Other: None.
IMPRESSION: 1. No acute intracranial abnormalities. Normal brain.
2. Retention cyst versus polyp in the right maxillary sinus.

## 2020-10-29 IMAGING — CT CT ANGIO CHEST
2 of 6 series · 17 of 46 positions shown · IV contrast (APPLIED)
Comparison: None.

CLINICAL DATA: Acute chest pain shortness of breath, recent
cervical spine fusion 2 days ago

EXAM:
CT ANGIOGRAPHY CHEST WITH CONTRAST
TECHNIQUE: Multidetector CT imaging of the chest was performed using the
standard protocol during bolus administration of intravenous
contrast. Multiplanar CT image reconstructions and MIPs were
obtained to evaluate the vascular anatomy.
CONTRAST:  75mL OMNIPAQUE IOHEXOL 350 MG/ML SOLN

[Series 5: thins · axial · 0.73mm/px · z∈[-292,-69]mm · 14 of 305 slices shown]
[im 13/305  lung]
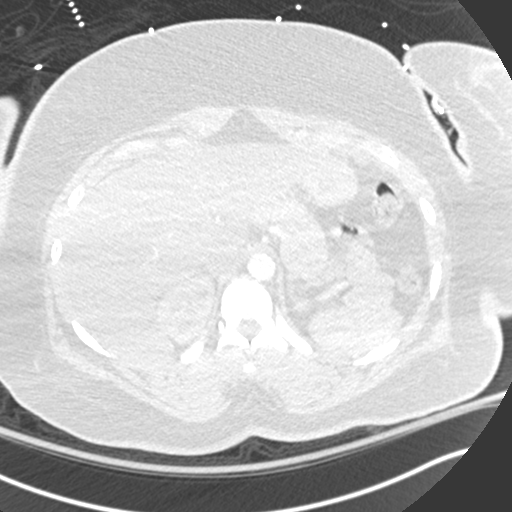
[im 39/305  soft-tissue]
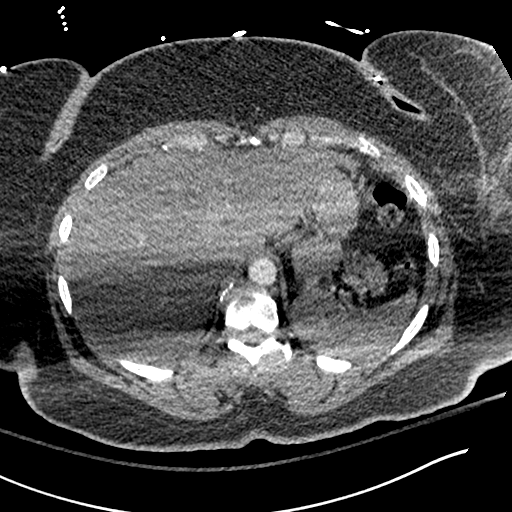
[im 64/305  lung]
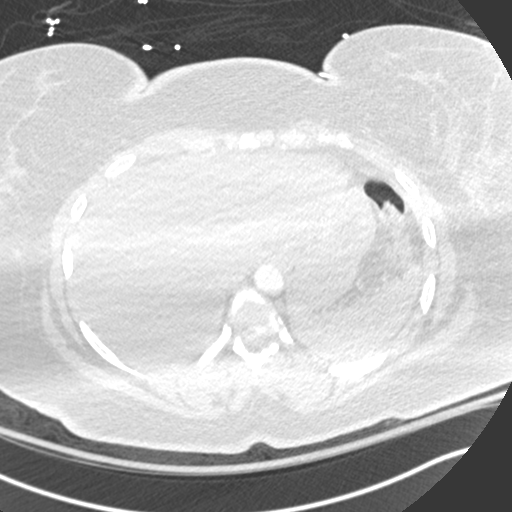
[im 77/305  soft-tissue]
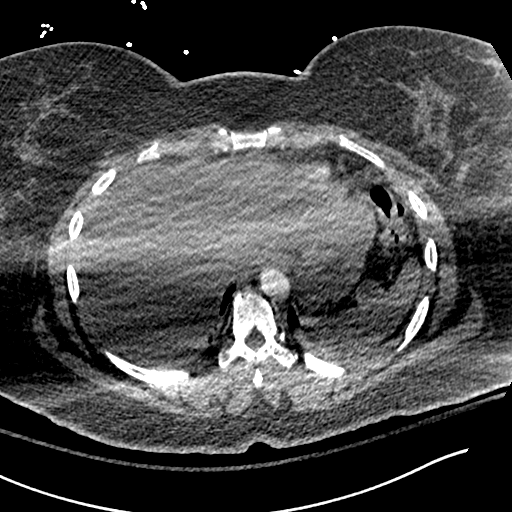
[im 102/305  lung]
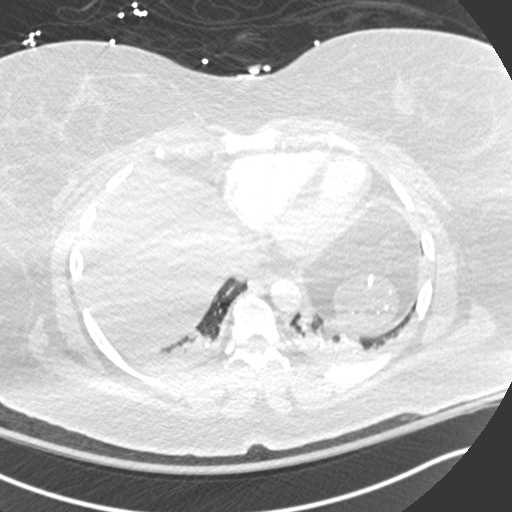
[im 127/305  soft-tissue]
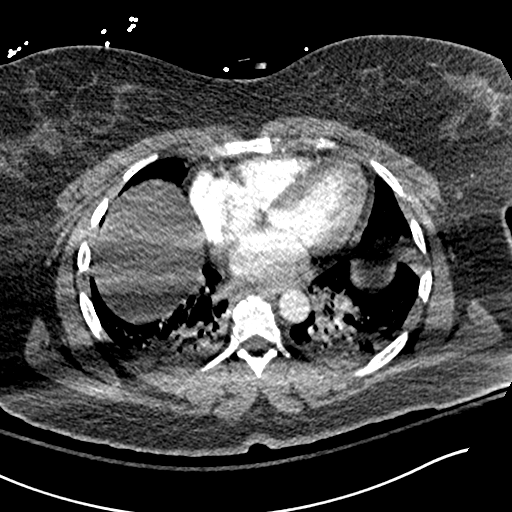
[im 140/305  lung]
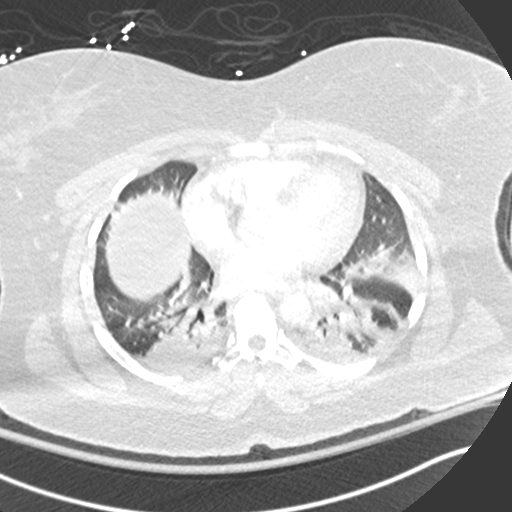
[im 165/305  soft-tissue]
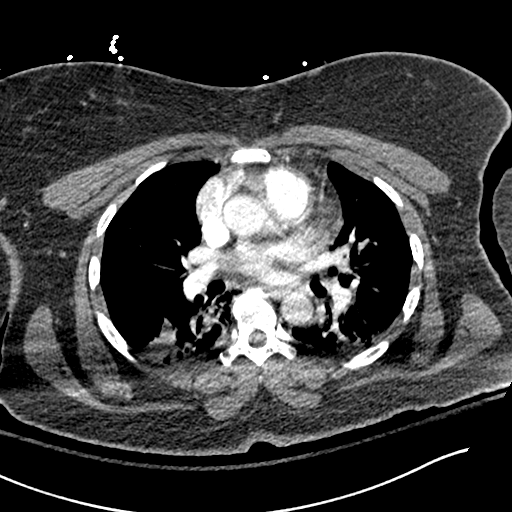
[im 178/305  lung]
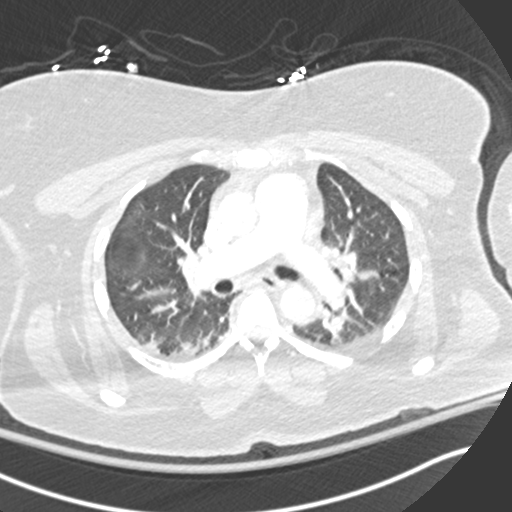
[im 203/305  soft-tissue]
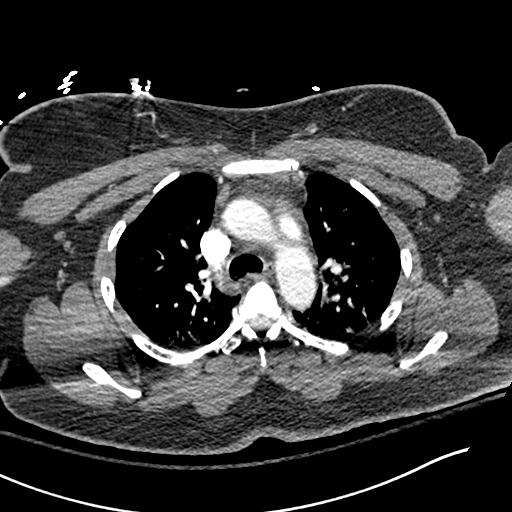
[im 229/305  lung]
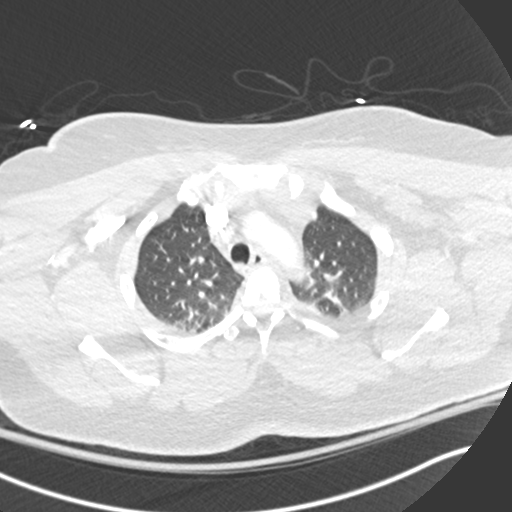
[im 241/305  soft-tissue]
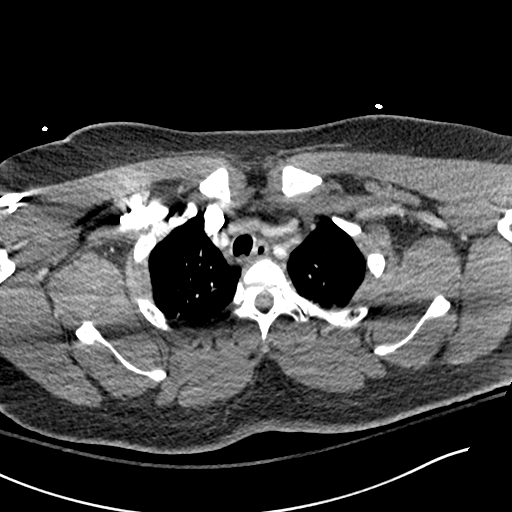
[im 267/305  lung]
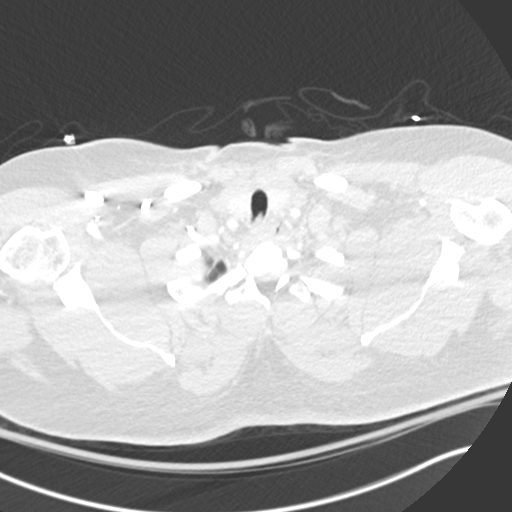
[im 292/305  soft-tissue]
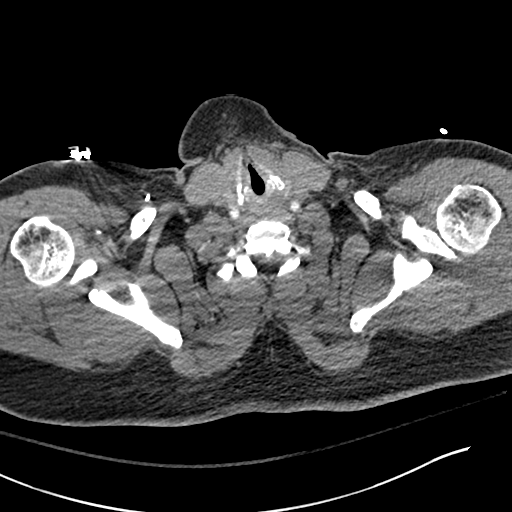

[Series 7: coronal mpr · coronal · 0.49mm/px · 3 of 90 slices shown]
[im 23/90  soft-tissue]
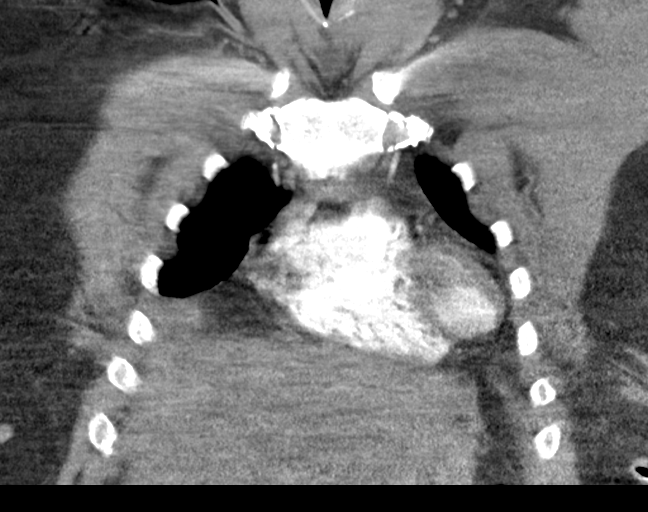
[im 45/90  soft-tissue]
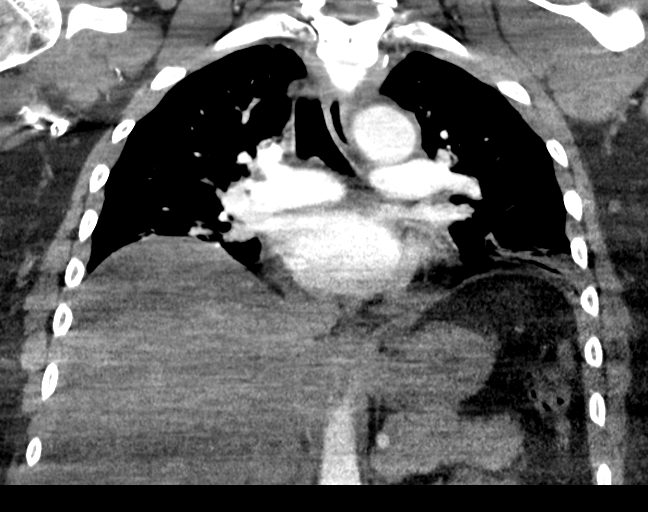
[im 67/90  soft-tissue]
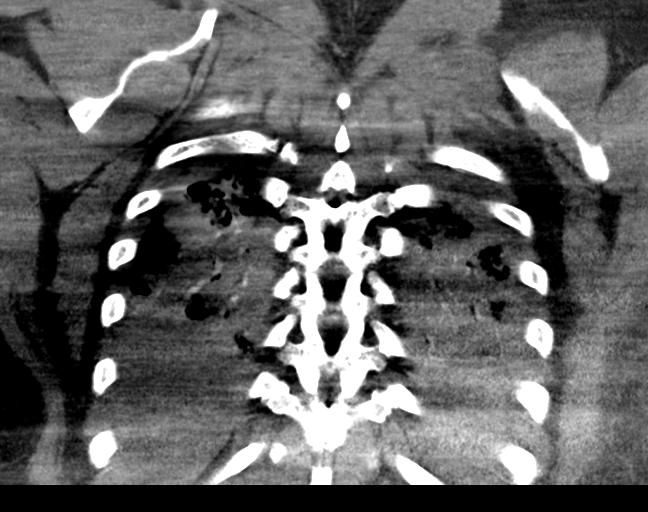

[17 of 46 positions shown; findings below may reference images not displayed]

FINDINGS: Cardiovascular: Central and proximal hilar pulmonary vasculature
appear patent without significant acute filling defect or pulmonary
embolus. Limited assessment of the lower lobe peripheral segmental
branches because of low lung volumes and bibasilar
collapse/consolidation. Difficult to exclude small peripheral PE.

Normal heart size. Intact thoracic aorta. Negative for aneurysm or
dissection. No mediastinal hemorrhage or hematoma. Central venous
structures appear patent. No AXEL process.

Mediastinum/Nodes: No enlarged mediastinal, hilar, or axillary lymph
nodes. Thyroid gland, trachea, and esophagus demonstrate no
significant findings.

Lungs/Pleura: Low lung volumes with bibasilar
atelectasis/consolidation. Difficult to exclude basilar pneumonia.
Lingula and right middle lobe atelectasis also noted. No large
effusion, pleural abnormality or pneumothorax. Trachea and central
airways appear patent.

Upper Abdomen: Diffuse hepatic steatosis noted of the imaged portion
of the liver. No biliary obstruction pattern.

Left upper quadrant subdiaphragmatic low-attenuation complex cystic
lesion with macroscopic fat and septal calcifications this appears
to originate from the splenic upper pole. This measures 5 cm and has
a indolent chronic appearance. Recommend follow-up dedicated abdomen
CT without and with contrast in 3 months as an outpatient.

Musculoskeletal: No acute osseous finding. Minor endplate
degenerative changes. No compression fracture. Sternum intact.

Review of the MIP images confirms the above findings.
IMPRESSION: Negative for significant acute central or proximal hilar pulmonary
embolus. Limited assessment of the peripheral segmental branches for
small PE evaluation.

Dense bibasilar collapse/consolidation and additional areas of right
middle lobe and lingula atelectasis.

Hepatic steatosis

Indeterminate left upper quadrant 5 cm complex septated cystic
lesion with macroscopic fat and central calcifications measuring up
to 5 cm. See above comment and recommendation.

## 2020-10-29 IMAGING — MR MR HEAD WO/W CM
17 of 19 series · 38 of 48 positions shown · IV contrast (gadavist)
Comparison: Head CT same day.

CLINICAL DATA: Seizure. Abnormal neurological exam. Postictal
state.

EXAM:
MRI HEAD WITHOUT AND WITH CONTRAST
TECHNIQUE: Multiplanar, multiecho pulse sequences of the brain and surrounding
structures were obtained without and with intravenous contrast.
CONTRAST:  10mL GADAVIST GADOBUTROL 1 MMOL/ML IV SOLN

[Series 5: ax dwi_tracew · axial · 3.0mm · 0.65mm/px · z∈[-123,+32]mm · 5 of 100 slices shown]
[im 1/100]
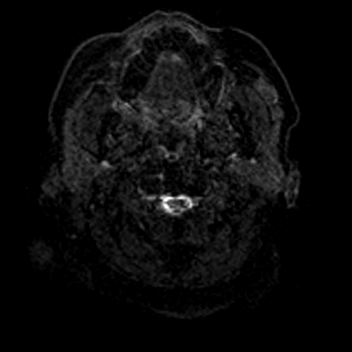
[im 25/100]
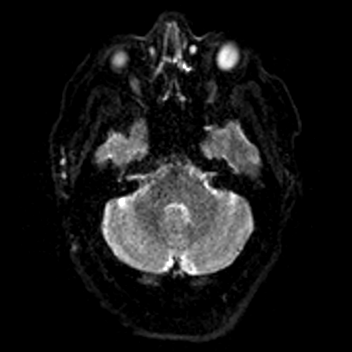
[im 50/100]
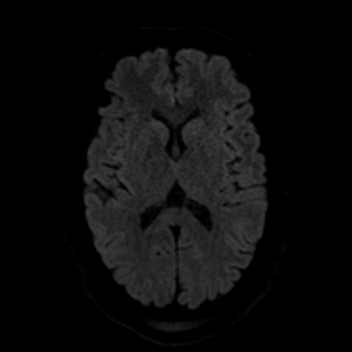
[im 75/100]
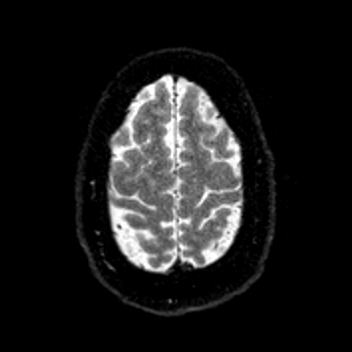
[im 100/100]
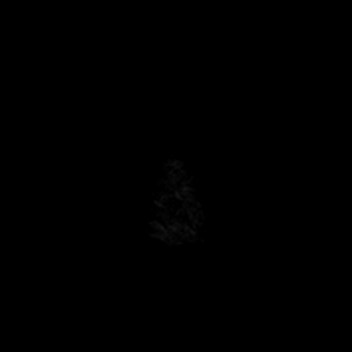

[Series 6: ax dwi_adc · axial · 3.0mm · 0.65mm/px · z∈[-123,+32]mm · 2 of 50 slices shown]
[im 1/50]
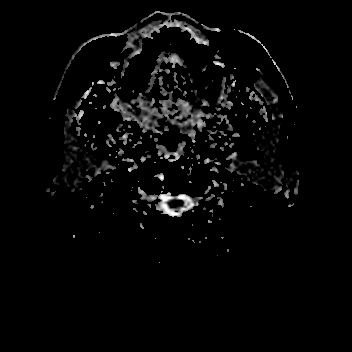
[im 50/50]
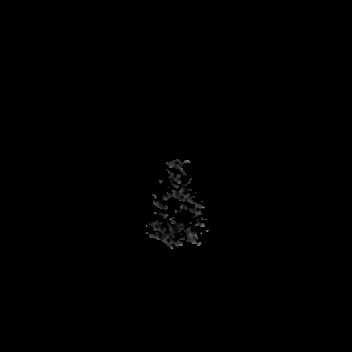

[Series 7: cor dwi_tracew · coronal · 5.0mm · 0.60mm/px · 3 of 80 slices shown]
[im 1/80]
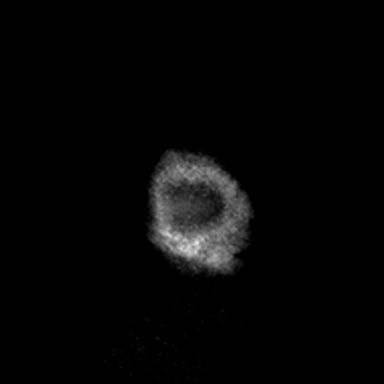
[im 40/80]
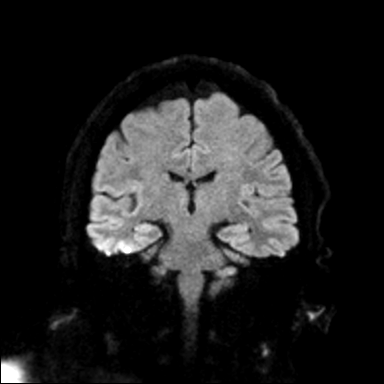
[im 80/80]
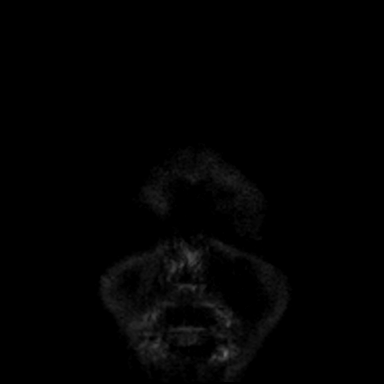

[Series 8: cor dwi_adc · coronal · 5.0mm · 0.60mm/px · 1 of 40 slices shown]
[im 1/40]
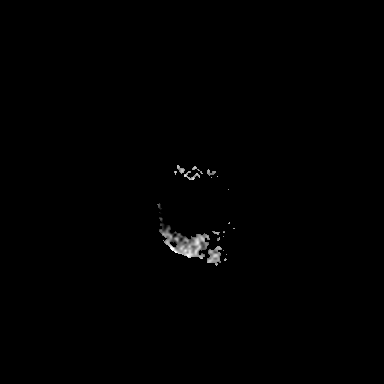

[Series 9: T1 · sagittal · 5.0mm · 0.62mm/px · 1 of 25 slices shown (1 of 2)]
[im 1/25]
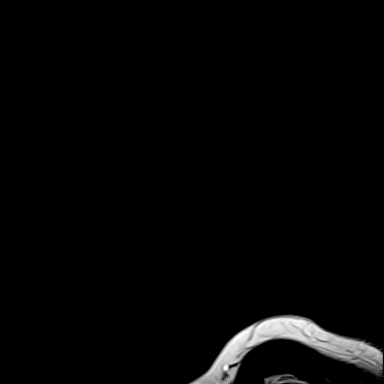

[Series 10: T2 · axial · 5.0mm · 0.53mm/px · 1 of 27 slices shown (1 of 2)]
[im 1/27]
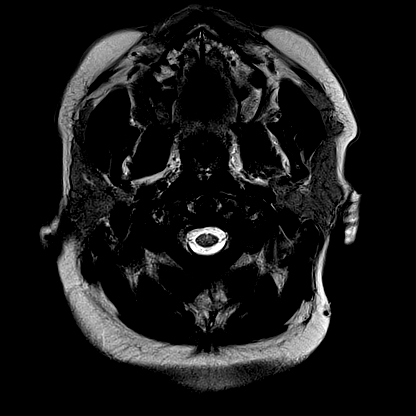

[Series 11: mag_images · axial · 3.0mm · 0.90mm/px · z∈[-128,+42]mm · 2 of 60 slices shown]
[im 1/60]
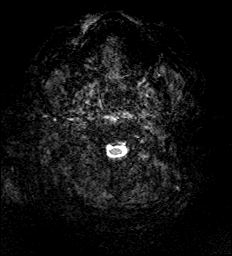
[im 60/60]
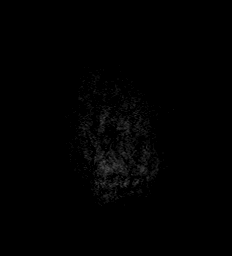

[Series 12: pha_images · axial · 3.0mm · 0.90mm/px · z∈[-128,+42]mm · 2 of 60 slices shown]
[im 1/60]
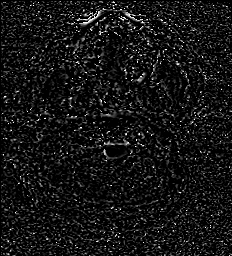
[im 60/60]
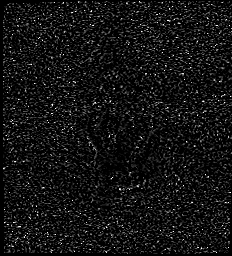

[Series 13: swi_images · axial · 3.0mm · 0.90mm/px · z∈[-128,+42]mm · 2 of 60 slices shown]
[im 1/60]
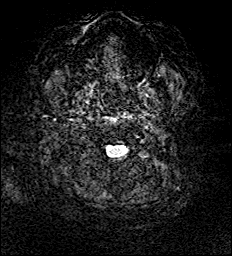
[im 60/60]
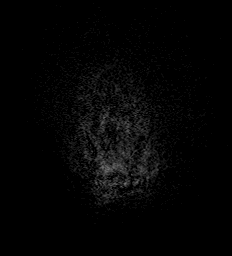

[Series 15: FLAIR · axial · 3.0mm · 0.53mm/px · z∈[-123,+32]mm · 2 of 55 slices shown (1 of 2)]
[im 1/55]
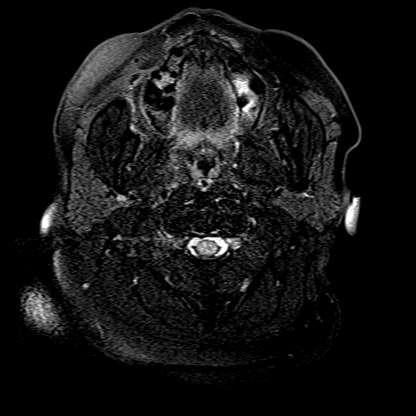
[im 55/55]
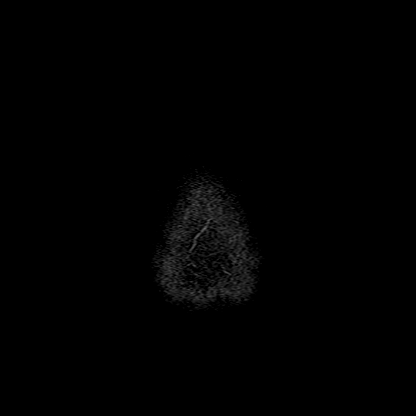

[Series 16: T1 · axial · 1.0mm · 1.09mm/px · z∈[-128,+40]mm · 6 of 176 slices shown (2 of 2)]
[im 1/176]
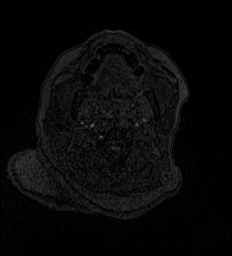
[im 36/176]
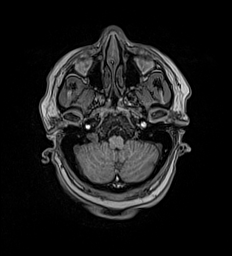
[im 71/176]
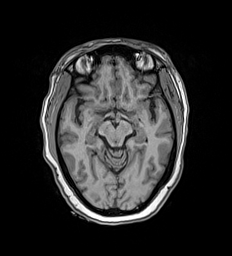
[im 106/176]
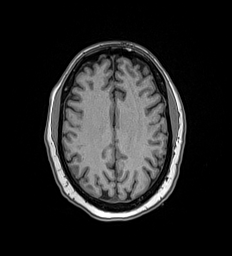
[im 141/176]
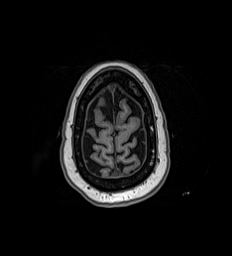
[im 176/176]
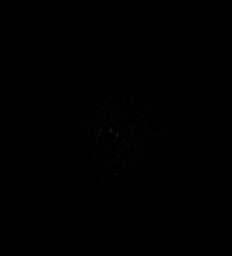

[Series 17: T2 · coronal · 3.0mm · 0.23mm/px · 1 of 35 slices shown (2 of 2)]
[im 1/35]
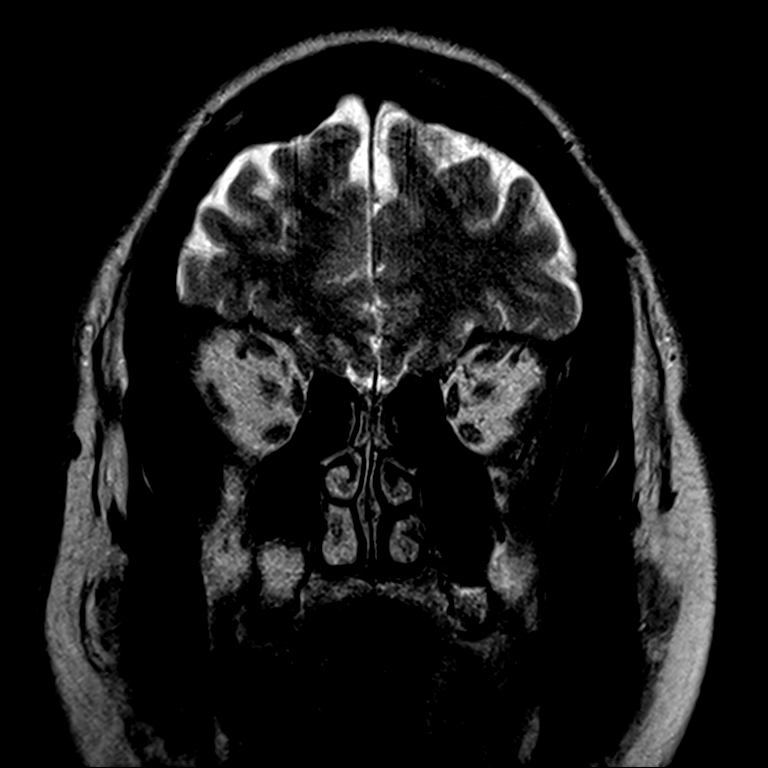

[Series 18: FLAIR · coronal · 3.0mm · 0.35mm/px · 1 of 35 slices shown (2 of 2)]
[im 1/35]
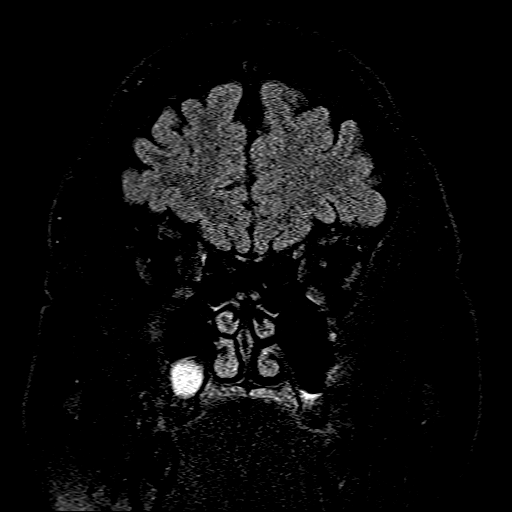

[Series 19: T2 post-contrast · coronal · 5.0mm · 0.57mm/px · 1 of 29 slices shown]
[im 1/29]
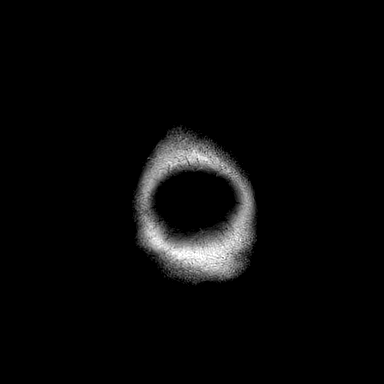

[Series 20: T1 post-contrast · axial · 1.0mm · 1.09mm/px · z∈[-128,+40]mm · 6 of 176 slices shown (1 of 3)]
[im 1/176]
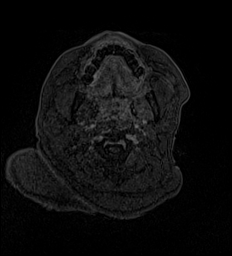
[im 36/176]
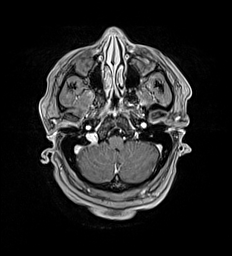
[im 71/176]
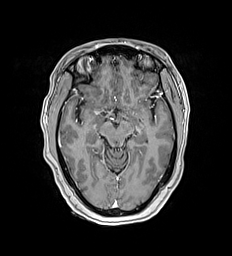
[im 106/176]
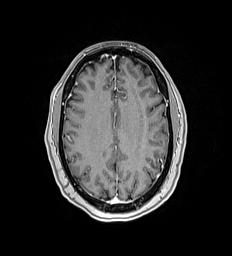
[im 141/176]
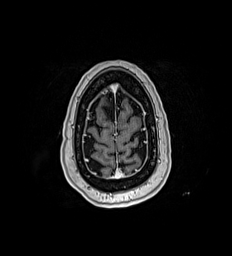
[im 176/176]
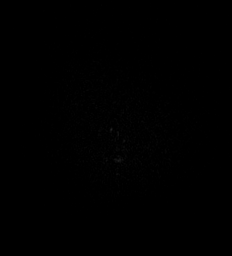

[Series 21: T1 post-contrast · coronal · 5.0mm · 0.57mm/px · 1 of 29 slices shown (2 of 3)]
[im 1/29]
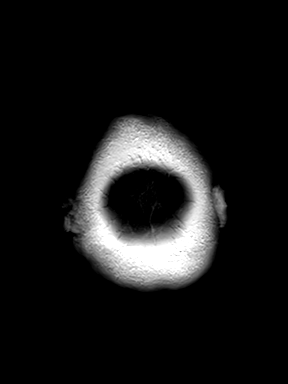

[Series 22: T1 post-contrast · sagittal · 5.0mm · 0.62mm/px · 1 of 25 slices shown (3 of 3)]
[im 1/25]
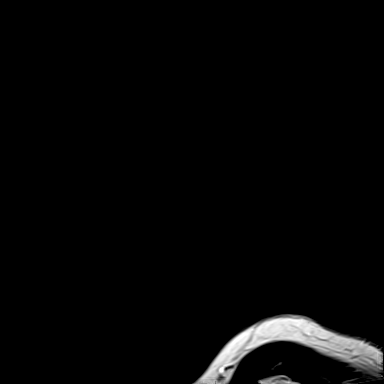

[38 of 48 positions shown; findings below may reference images not displayed]

FINDINGS: Brain: Diffusion imaging does not show any acute or subacute
infarction. No abnormality affects the brainstem or cerebellum. The
left cerebral hemispheres normal. In the right frontal lobe, there
are a few small foci of T2 and FLAIR signal within the white matter
and at the right gray-white junction. Mesial temporal lobes appear
normal on both sides. No evidence of mass, hemorrhage, hydrocephalus
or extra-axial collection.

Vascular: Major vessels at the base of the brain show flow.

Skull and upper cervical spine: Negative

Sinuses/Orbits: Retention cyst in the right maxillary sinus. Sinuses
otherwise clear. Orbits negative.

Other: None
IMPRESSION: No acute brain finding. Few small foci of T2 and FLAIR signal within
the right frontal white matter and at the gray-white junction. These
are often seen in asymptomatic individuals, but could conceivably
relate to seizure

## 2020-10-29 MED ORDER — SENNA 8.6 MG PO TABS: 1.0000 | ORAL_TABLET | Freq: Two times a day (BID) | ORAL | Status: DC

## 2020-10-29 MED ORDER — ACETAMINOPHEN 650 MG RE SUPP
650.0000 mg | Freq: Four times a day (QID) | RECTAL | Status: DC | PRN
  Filled 2020-10-29: qty 1

## 2020-10-29 MED ORDER — ASPIRIN EC 81 MG PO TBEC
81.0000 mg | DELAYED_RELEASE_TABLET | Freq: Every day | ORAL | Status: DC
  Administered 2020-10-31 – 2020-11-02 (×3): 81 mg via ORAL
  Filled 2020-10-29 (×3): qty 1

## 2020-10-29 MED ORDER — MORPHINE SULFATE (PF) 2 MG/ML IV SOLN
2.0000 mg | INTRAVENOUS | Status: DC | PRN
Start: 2020-10-29 — End: 2020-11-02
  Administered 2020-10-29 – 2020-11-02 (×13): 2 mg via INTRAVENOUS
  Filled 2020-10-29 (×13): qty 1

## 2020-10-29 MED ORDER — ROSUVASTATIN CALCIUM 10 MG PO TABS
40.0000 mg | ORAL_TABLET | Freq: Every day | ORAL | Status: DC
  Administered 2020-10-30 – 2020-11-01 (×3): 40 mg via ORAL
  Filled 2020-10-29 (×2): qty 4
  Filled 2020-10-29: qty 2

## 2020-10-29 MED ORDER — SENNOSIDES-DOCUSATE SODIUM 8.6-50 MG PO TABS
1.0000 | ORAL_TABLET | Freq: Two times a day (BID) | ORAL | Status: DC
  Administered 2020-10-30 – 2020-11-02 (×6): 1 via ORAL
  Filled 2020-10-29 (×6): qty 1

## 2020-10-29 MED ORDER — IOHEXOL 350 MG/ML SOLN
75.0000 mL | Freq: Once | INTRAVENOUS | Status: AC | PRN
  Administered 2020-10-29: 75 mL via INTRAVENOUS

## 2020-10-29 MED ORDER — LORAZEPAM 2 MG/ML IJ SOLN
2.0000 mg | INTRAMUSCULAR | Status: DC | PRN
  Administered 2020-10-29: 2 mg via INTRAVENOUS
  Filled 2020-10-29: qty 1

## 2020-10-29 MED ORDER — GADOBUTROL 1 MMOL/ML IV SOLN
10.0000 mL | Freq: Once | INTRAVENOUS | Status: AC | PRN
  Administered 2020-10-29: 10 mL via INTRAVENOUS

## 2020-10-29 MED ORDER — ACETAMINOPHEN 325 MG PO TABS
650.0000 mg | ORAL_TABLET | Freq: Four times a day (QID) | ORAL | Status: DC | PRN
  Administered 2020-10-31: 650 mg via ORAL
  Filled 2020-10-29: qty 2

## 2020-10-29 MED ORDER — VITAMIN D 25 MCG (1000 UNIT) PO TABS
1000.0000 [IU] | ORAL_TABLET | Freq: Every day | ORAL | Status: DC
  Administered 2020-10-31 – 2020-11-02 (×3): 1000 [IU] via ORAL
  Filled 2020-10-29 (×3): qty 1

## 2020-10-29 MED ORDER — METHOCARBAMOL 500 MG PO TABS
500.0000 mg | ORAL_TABLET | Freq: Four times a day (QID) | ORAL | Status: DC
  Administered 2020-10-30 – 2020-11-02 (×11): 500 mg via ORAL
  Filled 2020-10-29 (×21): qty 1

## 2020-10-29 MED ORDER — LEVETIRACETAM IN NACL 500 MG/100ML IV SOLN
500.0000 mg | Freq: Once | INTRAVENOUS | Status: AC
  Administered 2020-10-29: 500 mg via INTRAVENOUS
  Filled 2020-10-29: qty 100

## 2020-10-29 MED ORDER — ONDANSETRON HCL 4 MG/2ML IJ SOLN: 4.0000 mg | Freq: Three times a day (TID) | INTRAMUSCULAR | Status: DC | PRN

## 2020-10-29 MED ORDER — KETOROLAC TROMETHAMINE 15 MG/ML IJ SOLN
15.0000 mg | Freq: Four times a day (QID) | INTRAMUSCULAR | Status: DC | PRN
  Filled 2020-10-29: qty 1

## 2020-10-29 MED ORDER — LEVOTHYROXINE SODIUM 25 MCG PO TABS
12.5000 ug | ORAL_TABLET | Freq: Every day | ORAL | Status: DC
  Administered 2020-10-31 – 2020-11-02 (×3): 12.5 ug via ORAL
  Filled 2020-10-29 (×2): qty 1
  Filled 2020-10-29: qty 0.5
  Filled 2020-10-29: qty 1
  Filled 2020-10-29: qty 0.5

## 2020-10-29 MED ORDER — OXYCODONE-ACETAMINOPHEN 5-325 MG PO TABS
1.0000 | ORAL_TABLET | ORAL | Status: DC | PRN
  Filled 2020-10-29: qty 1

## 2020-10-29 MED ORDER — POLYETHYLENE GLYCOL 3350 17 G PO PACK
17.0000 g | PACK | Freq: Every day | ORAL | Status: DC | PRN
  Administered 2020-10-30: 17 g via ORAL
  Filled 2020-10-29: qty 1

## 2020-10-29 MED ORDER — ACETAMINOPHEN 650 MG RE SUPP
650.0000 mg | Freq: Once | RECTAL | Status: AC
  Administered 2020-10-29: 650 mg via RECTAL
  Filled 2020-10-29: qty 1

## 2020-10-29 NOTE — Procedures (Addendum)
Routine EEG Report  Leslie Meadows is a 50 y.o. female with a history of  who is undergoing an EEG to evaluate for seizures.  Report: This EEG was acquired with electrodes placed according to the International 10-20 electrode system (including Fp1, Fp2, F3, F4, C3, C4, P3, P4, O1, O2, T3, T4, T5, T6, A1, A2, Fz, Cz, Pz). The following electrodes were missing or displaced: none.  The occipital dominant rhythm was 9 Hz. This activity is reactive to stimulation. Drowsiness was manifested by background fragmentation; deeper stages of sleep were identified by K complexes and sleep spindles. There was no focal slowing. There were no interictal epileptiform discharges. There were no electrographic seizures identified. There was no abnormal response to photic stimulation or hyperventilation.   Impression: This EEG was obtained while awake and asleep and is normal.    Clinical Correlation: Normal EEGs, however, do not rule out epilepsy.  Bing Neighbors, MD [facility] [phone]  If 7pm- 7am, please page neurology on call as listed in [facility].

## 2020-10-29 NOTE — ED Notes (Signed)
Patient transported to MRI 

## 2020-10-29 NOTE — ED Notes (Signed)
Lab tech at bedside for blood draw.

## 2020-10-29 NOTE — ED Notes (Signed)
Patients daughter called RN into room stating patient reports chest pain. EKG performed and admitting MD notified face to face. States he is going into room.

## 2020-10-29 NOTE — ED Provider Notes (Signed)
[facility] Emergency Department Provider Note  ____________________________________________   Event Date/Time   First MD Initiated Contact with Patient 10/29/20 [phone]     (approximate)  I have reviewed the triage vital signs and the nursing notes.   HISTORY  Chief Complaint Seizures  Level 5 caveat:  history/ROS limited by acute/critical illness  HPI Leslie Meadows is a 50 y.o. female with medical history as listed below who presents by EMS for evaluation of possible seizure.  Reportedly she had an anterior approach to a cervical spinal surgery by Dr. Adriana Simas within the last few days.  She allegedly had a similar episode while she was in the hospital where she was having some shaking behaviors and altered mental status, inability to speak, etc.  This was thought to be due to side effects from her pain medication and she was discharged from the hospital.  She presents tonight after an extended seizure-like episode that may have lasted as long as 5 minutes.  EMS details were relatively limited, but her daughter arrived subsequently and provided some additional details.  The patient was apparently at her baseline but up and around the house at 3 AM.  She asked for some more pain medicine and her daughter tried to help her.  The daughter had to go get something and then when she came back the patient had gotten herself back into the bedroom and was lying down.  At that point she started to have some fluttering of her eyes and jerking movements and she was not talking or answering questions, so the daughter called 911.  Upon arrival the patient is altered but protecting her airway.     Past Medical History:  Diagnosis Date   HLD (hyperlipidemia)    Sleep apnea    Thyroid disease     Patient Active Problem List   Diagnosis Date Noted   Cervical myelopathy (HCC) 10/27/2020   Obesity, unspecified 08/08/2017   Anxiety 01/07/2017    Past Surgical History:   Procedure Laterality Date   ABDOMINAL HYSTERECTOMY     ANTERIOR CERVICAL DECOMP/DISCECTOMY FUSION N/A 10/27/2020   Procedure: C4-5 ANTERIOR CERVICAL DECOMPRESSION/DISCECTOMY FUSION 1 LEVEL;  Surgeon: Lucy Chris, MD;  Location: [facility] ORS;  Service: Neurosurgery;  Laterality: N/A;   TUBAL LIGATION      Prior to Admission medications   Medication Sig Start Date End Date Taking? Authorizing Provider  cholecalciferol (VITAMIN D3) 25 MCG (1000 UNIT) tablet Take 1,000 Units by mouth daily.    [provider]  levothyroxine (SYNTHROID, LEVOTHROID) 25 MCG tablet Take 12.5 mcg by mouth daily before breakfast.    [provider]  methocarbamol (ROBAXIN) 500 MG tablet Take 1 tablet (500 mg total) by mouth every 6 (six) hours. 10/28/20   Susanne Borders, PA  NON FORMULARY Pt uses a cpap nightly    [provider]  rosuvastatin (CRESTOR) 40 MG tablet Take 40 mg by mouth at bedtime. 05/09/19   [provider]  senna (SENOKOT) 8.6 MG TABS tablet Take 1 tablet (8.6 mg total) by mouth 2 (two) times daily. 10/28/20   Susanne Borders, PA  traMADol (ULTRAM) 50 MG tablet Take 1 tablet (50 mg total) by mouth every 6 (six) hours as needed for up to 5 days. 10/28/20 11/02/20  Susanne Borders, PA    Allergies Patient has no known allergies.  Family History  Problem Relation Age of Onset   Breast cancer Paternal Grandmother     Social History  Social History   Tobacco Use   Smoking status: Never   Smokeless tobacco: Never  Vaping Use   Vaping Use: Never used  Substance Use Topics   Alcohol use: No    Alcohol/week: 0.0 standard drinks   Drug use: No    Review of Systems Level 5 caveat:  history/ROS limited by acute/critical illness  ____________________________________________   PHYSICAL EXAM:  VITAL SIGNS: ED Triage Vitals [10/29/20 0617]  Enc Vitals Group     BP (!) 163/99     Pulse Rate 94     Resp 17     Temp 98.6 F (37 C)     Temp Source Oral      SpO2 96 %     Weight      Height      Head Circumference      Peak Flow      Pain Score      Pain Loc      Pain Edu?      Excl. in GC?     Constitutional: Awake but with decreased level of responsiveness. Eyes: Rapid and irregular eye movements from side to side.  Pupils are equal and reactive. Head: Atraumatic. Nose: No congestion/rhinnorhea. Mouth/Throat: Patient's tongue is protruding slightly from her mouth but this is likely due to combination of body habitus and the soft neck collar that she is wearing postoperatively. Neck: No stridor.  Soft neck collar in place.  I looked underneath and saw what appears to be well-appearing surgical wounds with no evidence of hematoma or other swelling. Cardiovascular: Normal rate, regular rhythm. Good peripheral circulation. Respiratory: Normal respiratory effort.  No retractions. Gastrointestinal: Morbid obesity.  Soft and nontender. No distention.  Musculoskeletal: No lower extremity tenderness nor edema. No gross deformities of extremities. Neurologic: Patient protecting airway, having nonspecific rapid eye movements and twitches of her face.  No visible tonic-clonic activity in her extremities.  When a brisk sternal rub was applied, she first grimace, then open her eyes and grabbed my hand with her left hand.  She then relaxed when I stopped the stimulus.  I did it again and this time she reached up and grabbed my arm with her right hand while staring at me.  When I relaxed, so did she and she closed her eyes again and her eyes continue to move beneath her lids. Skin:  Skin is warm, dry and intact.   ____________________________________________   LABS (all labs ordered are listed, but only abnormal results are displayed)  Labs Reviewed  COMPREHENSIVE METABOLIC PANEL - Abnormal; Notable for the following components:      Result Value   Glucose, Bld 107 (*)    Creatinine, Ser 1.01 (*)    All other components within normal limits   CBC WITH DIFFERENTIAL/PLATELET - Abnormal; Notable for the following components:   RBC 5.24 (*)    All other components within normal limits  ACETAMINOPHEN LEVEL - Abnormal; Notable for the following components:   Acetaminophen (Tylenol), Serum <10 (*)    All other components within normal limits  SALICYLATE LEVEL - Abnormal; Notable for the following components:   Salicylate Lvl <7.0 (*)    All other components within normal limits  RESP PANEL BY RT-PCR (FLU A&B, COVID) ARPGX2  MAGNESIUM  ETHANOL  URINE DRUG SCREEN, QUALITATIVE ([facility] ONLY)  CBG MONITORING, ED  POC URINE PREG, ED   ____________________________________________  EKG  ED ECG REPORT I, Loleta Rose, the attending physician, personally viewed and interpreted this ECG.  Date: 10/29/2020 EKG Time: 6:10 AM Rate: 97 Rhythm: normal sinus rhythm QRS Axis: Left axis deviation Intervals: normal ST/T Wave abnormalities: Non-specific ST segment / T-wave changes, but no clear evidence of acute ischemia. Narrative Interpretation: no definitive evidence of acute ischemia; does not meet STEMI criteria.  ____________________________________________  RADIOLOGY CT head without contrast pending at time of signout ____________________________________________   PROCEDURES   Procedure(s) performed (including Critical Care):  .1-3 Lead EKG Interpretation Performed by: Loleta Rose, MD Authorized by: Loleta Rose, MD     Interpretation: normal     ECG rate:  95   ECG rate assessment: normal     Rhythm: sinus rhythm     Ectopy: none     Conduction: normal   .Critical Care Performed by: Loleta Rose, MD Authorized by: Loleta Rose, MD   Critical care provider statement:    Critical care time (minutes):  30   Critical care time was exclusive of:  Separately billable procedures and treating other patients   Critical care was necessary to treat or prevent imminent or life-threatening deterioration of the following  conditions:  CNS failure or compromise   Critical care was time spent personally by me on the following activities:  Development of treatment plan with patient or surrogate, discussions with consultants, evaluation of patient's response to treatment, examination of patient, obtaining history from patient or surrogate, ordering and performing treatments and interventions, ordering and review of laboratory studies, ordering and review of radiographic studies, pulse oximetry, re-evaluation of patient's condition and review of old charts   ____________________________________________   INITIAL IMPRESSION / MDM / ASSESSMENT AND PLAN / ED COURSE  As part of my medical decision making, I reviewed the following data within the electronic MEDICAL RECORD NUMBER History obtained from family, Nursing notes reviewed and incorporated, EKG interpreted , Patient signed out to Dr. Michiel Sites, and reviewed Notes from prior ED visits   Differential diagnosis includes, but is not limited to, seizure, medication or drug side effect, pseudoseizure, nonspecific secondary gain, intracranial hemorrhage, CVA.  The patient is on the cardiac monitor to evaluate for evidence of arrhythmia and/or significant heart rate changes.  The patient's presentation is not completely consistent with epileptic seizure and seems somewhat suggestive of nonepileptic pseudoseizure.  I reviewed the medical record and saw that she did have a similar but apparently milder episode when she was in the hospital that resulted in a rapid response.  She reportedly was back to her baseline within a couple of hours.  She has a generally reassuring exam and spite of the altered mental status.  I do not believe she is actively seizing because she stops the activities or behaviors when I apply a painful stimulus.  She is protecting her airway and intubating her could be a potentially dangerous procedure given her recent anterior neck surgery.  Related to that, I  see no external evidence that the patient is suffering from a postoperative complication such as an expanding neck hematoma.  After I talked with her a couple different times, she indicated to me that her head hurts by taking my hand and her hand and putting it on her head.  I asked her if she has a headache and she nodded.  She is denying chest pain and shortness of breath.  She seems to be indicating that she cannot speak, and once her daughter arrived, her daughter confirmed that this is what she did previously.  I have a low suspicion for acute intracranial hemorrhage or  CVA but it is within the realm of possibility.  Patient stable for work-up including scans and she does not require intubation at this time.    Clinical Course as of 10/29/20 0820  Wed Oct 29, 2020  [id number] Transferring ED care to Dr. Michiel Sites to follow up labs, imaging, and reassess. [CF]    Clinical Course User Index [CF] Loleta Rose, MD    ____________________________________________  FINAL CLINICAL IMPRESSION(S) / ED DIAGNOSES  Final diagnoses:  Seizure-like activity (HCC)     MEDICATIONS GIVEN DURING THIS VISIT:  Medications  levETIRAcetam (KEPPRA) IVPB 500 mg/100 mL premix (0 mg Intravenous Stopped 10/29/20 0741)     ED Discharge Orders     None        Note:  This document was prepared using Dragon voice recognition software and may include unintentional dictation errors.   Loleta Rose, MD 10/29/20 [phone]

## 2020-10-29 NOTE — ED Notes (Signed)
Patient just returned to room from MRI.  

## 2020-10-29 NOTE — H&P (Signed)
History and Physical    ZHURI KRASS FAO:[id number] DOB: 10-15-1970 DOA: 10/29/2020  Referring MD/NP/PA:   PCP: Margaretann Loveless, MD   Patient coming from:  The patient is coming from home.  At baseline, pt is independent for most of ADL.        Chief Complaint: seizure  HPI: Leslie Meadows is a 50 y.o. female with medical history significant of hyperlipidemia, hypothyroidism, anxiety, OSA on CPAP, obesity, cervical myelopathy (s/p of C4-5 ACDF), who present with seizure.  Pt was recently hospitalized from 9/19-9/20 due to cervical myelopathy. Pt underwent C4-5 ACDF by Dr. Adriana Simas of neurosurgery. Pt is taking Robaxin and Ultram after surgery. Per her daughter, pt was noted to have seizure like activity in the early morning at about 3:00 AM, described as "fluttering eyes and jerking movements". The episode lasted for about 5 minutes.  At that time.  Patient was not talking or answering questions. She reportedly had a similar episode previously while she was in the hospital involving some shaking behaviors and altered mental status with inability to speak. When I saw pt in ED, she is confused, slightly nodding her head when asked for question, slightly moves her extremities.  No facial droop.  Initially patient did not have chest pain, coughing or shortness breath, but later on pt seems to have developed some chest pain on the right side and mild shortness of breath.  No cough.  Temperature normal.  Patient does not have active nausea, vomiting or abdominal pain.  Patient is constipated per her daughter.  Pt has some pain in the surgical site of her neck per her daughter.  ED Course: pt was found to have WBC 8.1, troponin level 5, UDS positive for benzo, negative pregnancy test, Tylenol level less than 7, salicylate level less than 10, negative COVID PCR, GFR > 60, temperature normal, blood pressure 132/84, heart rate 86, RR 21, oxygen saturation 99% on room air.  CT of head is negative for acute  intracranial abnormalities.  Patient is placed on progressive bed for observation.  CTA of chest: Negative for significant acute central or proximal hilar pulmonary embolus. Limited assessment of the peripheral segmental branches for small PE evaluation.   Dense bibasilar collapse/consolidation and additional areas of right middle lobe and lingula atelectasis.   Hepatic steatosis   Indeterminate left upper quadrant 5 cm complex septated cystic lesion with macroscopic fat and central calcifications measuring up to 5 cm.  Left upper quadrant subdiaphragmatic low-attenuation complex cystic lesion with macroscopic fat and septal calcifications this appears to originate from the splenic upper pole. This measures 5 cm and has a indolent chronic appearance. Recommend follow-up dedicated abdomen CT without and with contrast in 3 months as an outpatient.   CT-C spin: 1. No evidence for cervical spine fracture. 2. Status post ACDF at C4-5. 3. Diffuse prevertebral soft tissue edema with a few foci of gas within the retropharyngeal and prevertebral soft tissues compatible with recent ACDF.   Review of Systems: Could not be reviewed since patient is confused   Allergy: No Known Allergies  Past Medical History:  Diagnosis Date   HLD (hyperlipidemia)    Sleep apnea    Thyroid disease     Past Surgical History:  Procedure Laterality Date   ABDOMINAL HYSTERECTOMY     ANTERIOR CERVICAL DECOMP/DISCECTOMY FUSION N/A 10/27/2020   Procedure: C4-5 ANTERIOR CERVICAL DECOMPRESSION/DISCECTOMY FUSION 1 LEVEL;  Surgeon: Lucy Chris, MD;  Location: [facility] ORS;  Service: Neurosurgery;  Laterality:  N/A;   TUBAL LIGATION      Social History:  reports that she has never smoked. She has never used smokeless tobacco. She reports that she does not drink alcohol and does not use drugs.  Family History:  Family History  Problem Relation Age of Onset   Breast cancer Paternal Grandmother      Prior to  Admission medications   Medication Sig Start Date End Date Taking? Authorizing Provider  cholecalciferol (VITAMIN D3) 25 MCG (1000 UNIT) tablet Take 1,000 Units by mouth daily.    [provider]  levothyroxine (SYNTHROID, LEVOTHROID) 25 MCG tablet Take 12.5 mcg by mouth daily before breakfast.    [provider]  methocarbamol (ROBAXIN) 500 MG tablet Take 1 tablet (500 mg total) by mouth every 6 (six) hours. 10/28/20   Susanne Borders, PA  NON FORMULARY Pt uses a cpap nightly    [provider]  rosuvastatin (CRESTOR) 40 MG tablet Take 40 mg by mouth at bedtime. 05/09/19   [provider]  senna (SENOKOT) 8.6 MG TABS tablet Take 1 tablet (8.6 mg total) by mouth 2 (two) times daily. 10/28/20   Susanne Borders, PA  traMADol (ULTRAM) 50 MG tablet Take 1 tablet (50 mg total) by mouth every 6 (six) hours as needed for up to 5 days. 10/28/20 11/02/20  Susanne Borders, PA    Physical Exam: Vitals:   10/29/20 0830 10/29/20 1130 10/29/20 1200 10/29/20 1230  BP: 132/84 (!) 148/93 138/81 133/71  Pulse: 86 90 84 86  Resp: 16 17 17  (!) 22  Temp:      TempSrc:      SpO2: 99% 100% 99% 99%   General: Not in acute distress HEENT: s/p of surgery with some tenderness in left anterior neck       Eyes: PERRL, EOMI, no scleral icterus.       ENT: No discharge from the ears and nose,       Neck: No JVD, no bruit, no mass felt. Heme: No neck lymph node enlargement. Cardiac: S1/S2, RRR, No murmurs, No gallops or rubs. Respiratory: No rales, wheezing, rhonchi or rubs. GI: Soft, nondistended, nontender, no organomegaly, BS present. GU: No hematuria Ext: No pitting leg edema bilaterally. 1+DP/PT pulse bilaterally. Musculoskeletal: No joint deformities, No joint redness or warmth, no limitation of ROM in spin. Skin: No rashes.  Neuro: confused, not arousable, not oriented X3, cranial nerves II-XII grossly intact, moves all extremities slightly inski's sign. Normal  finger to nose test. Psych: Patient is not psychotic, no suicidal or hemocidal ideation.  Labs on Admission: I have personally reviewed following labs and imaging studies  CBC: Recent Labs  Lab 10/28/20 0909 10/29/20 0632  WBC 11.0* 8.1  NEUTROABS  --  4.6  HGB 14.8 14.6  HCT 43.7 43.8  MCV 84.9 83.6  PLT 244 234   Basic Metabolic Panel: Recent Labs  Lab 10/28/20 0909 10/29/20 0632  NA 137 138  K 4.6 3.8  CL 102 102  CO2 27 27  GLUCOSE 122* 107*  BUN 10 20  CREATININE 0.80 1.01*  CALCIUM 9.6 9.5  MG  --  2.2   GFR: Estimated Creatinine Clearance: 83.9 mL/min (A) (by C-G formula based on SCr of 1.01 mg/dL (H)). Liver Function Tests: Recent Labs  Lab 10/29/20 0632  AST 23  ALT 20  ALKPHOS 52  BILITOT 0.6  PROT 7.8  ALBUMIN 4.1   No results for input(s): LIPASE, AMYLASE in the last 168  hours. No results for input(s): AMMONIA in the last 168 hours. Coagulation Profile: No results for input(s): INR, PROTIME in the last 168 hours. Cardiac Enzymes: No results for input(s): CKTOTAL, CKMB, CKMBINDEX, TROPONINI in the last 168 hours. BNP (last 3 results) No results for input(s): PROBNP in the last 8760 hours. HbA1C: No results for input(s): HGBA1C in the last 72 hours. CBG: Recent Labs  Lab 10/27/20 2051 10/29/20 0802  GLUCAP 192* 93   Lipid Profile: No results for input(s): CHOL, HDL, LDLCALC, TRIG, CHOLHDL, LDLDIRECT in the last 72 hours. Thyroid Function Tests: No results for input(s): TSH, T4TOTAL, FREET4, T3FREE, THYROIDAB in the last 72 hours. Anemia Panel: No results for input(s): VITAMINB12, FOLATE, FERRITIN, TIBC, IRON, RETICCTPCT in the last 72 hours. Urine analysis: No results found for: COLORURINE, APPEARANCEUR, LABSPEC, PHURINE, GLUCOSEU, HGBUR, BILIRUBINUR, KETONESUR, PROTEINUR, UROBILINOGEN, NITRITE, LEUKOCYTESUR Sepsis Labs: @LABRCNTIP (procalcitonin:4,lacticidven:4) ) Recent Results (from the past 240 hour(s))  SARS CORONAVIRUS 2 (TAT  6-24 HRS) Nasopharyngeal Nasopharyngeal Swab     Status: None   Collection Time: 10/23/20 12:02 PM   Specimen: Nasopharyngeal Swab  Result Value Ref Range Status   SARS Coronavirus 2 NEGATIVE NEGATIVE Final    Comment: (NOTE) SARS-CoV-2 target nucleic acids are NOT DETECTED.  The SARS-CoV-2 RNA is generally detectable in upper and lower respiratory specimens during the acute phase of infection. Negative results do not preclude SARS-CoV-2 infection, do not rule out co-infections with other pathogens, and should not be used as the sole basis for treatment or other patient management decisions. Negative results must be combined with clinical observations, patient history, and epidemiological information. The expected result is Negative.  Fact Sheet for Patients: 10/25/20  Fact Sheet for Healthcare Providers: HairSlick.no  This test is not yet approved or cleared by the [employer]ierodirigir.com FDA and  has been authorized for detection and/or diagnosis of SARS-CoV-2 by FDA under an Emergency Use Authorization (EUA). This EUA will remain  in effect (meaning this test can be used) for the duration of the COVID-19 declaration under Se ction 564(b)(1) of the Act, 21 U.S.C. section 360bbb-3(b)(1), unless the authorization is terminated or revoked sooner.  Performed at [facility], [address], [place], [place]   Resp Panel by RT-PCR (Flu A&B, Covid) Nasopharyngeal Swab     Status: None   Collection Time: 10/29/20  7:46 AM   Specimen: Nasopharyngeal Swab; Nasopharyngeal(NP) swabs in vial transport medium  Result Value Ref Range Status   SARS Coronavirus 2 by RT PCR NEGATIVE NEGATIVE Final    Comment: (NOTE) SARS-CoV-2 target nucleic acids are NOT DETECTED.  The SARS-CoV-2 RNA is generally detectable in upper respiratory specimens during the acute phase of infection. The lowest concentration of SARS-CoV-2 viral  copies this assay can detect is 138 copies/mL. A negative result does not preclude SARS-Cov-2 infection and should not be used as the sole basis for treatment or other patient management decisions. A negative result may occur with  improper specimen collection/handling, submission of specimen other than nasopharyngeal swab, presence of viral mutation(s) within the areas targeted by this assay, and inadequate number of viral copies(<138 copies/mL). A negative result must be combined with clinical observations, patient history, and epidemiological information. The expected result is Negative.  Fact Sheet for Patients:  10/31/20  Fact Sheet for Healthcare Providers:  BloggerCourse.com  This test is no t yet approved or cleared by the [employer]ker.[employer] FDA and  has been authorized for detection and/or diagnosis of SARS-CoV-2 by FDA under  an Emergency Use Authorization (EUA). This EUA will remain  in effect (meaning this test can be used) for the duration of the COVID-19 declaration under Section 564(b)(1) of the Act, 21 U.S.C.section 360bbb-3(b)(1), unless the authorization is terminated  or revoked sooner.       Influenza A by PCR NEGATIVE NEGATIVE Final   Influenza B by PCR NEGATIVE NEGATIVE Final    Comment: (NOTE) The Xpert Xpress SARS-CoV-2/FLU/RSV plus assay is intended as an aid in the diagnosis of influenza from Nasopharyngeal swab specimens and should not be used as a sole basis for treatment. Nasal washings and aspirates are unacceptable for Xpert Xpress SARS-CoV-2/FLU/RSV testing.  Fact Sheet for Patients: BloggerCourse.com  Fact Sheet for Healthcare Providers: SeriousBroker.it  This test is not yet approved or cleared by the [place] FDA and has been authorized for detection and/or diagnosis of SARS-CoV-2 by FDA under an Emergency Use Authorization (EUA). This  EUA will remain in effect (meaning this test can be used) for the duration of the COVID-19 declaration under Section 564(b)(1) of the Act, 21 U.S.C. section 360bbb-3(b)(1), unless the authorization is terminated or revoked.  Performed at [facility], [address] [place], [place] [address]      Radiological Exams on Admission: DG Cervical Spine 2 or 3 views  Result Date: 10/27/2020 CLINICAL DATA:  C4-5 ACDF EXAM: CERVICAL SPINE - 2-3 VIEW; DG C-ARM 1-60 MIN COMPARISON:  09/12/2020 FINDINGS: Single lateral intraoperative fluoroscopic image the cervical spine demonstrates anterior cervical fusion hardware at the C4-5 level. Endotracheal tube is present. 15 seconds of fluoroscopy time was utilized. Please refer to performing physicians operative note for further detail. IMPRESSION: As above. Electronically Signed   By: Duanne Guess D.O.   On: 10/27/2020 15:18   CT HEAD WO CONTRAST  Result Date: 10/29/2020 CLINICAL DATA:  Seizure. EXAM: CT HEAD WITHOUT CONTRAST TECHNIQUE: Contiguous axial images were obtained from the base of the skull through the vertex without intravenous contrast. COMPARISON:  None. FINDINGS: Brain: No evidence of acute infarction, hemorrhage, hydrocephalus, extra-axial collection or mass lesion/mass effect. Vascular: No hyperdense vessel or unexpected calcification. Skull: Normal. Negative for fracture or focal lesion. Sinuses/Orbits: Retention cyst versus polyp noted in the right maxillary sinus. Other: None. IMPRESSION: 1. No acute intracranial abnormalities. Normal brain. 2. Retention cyst versus polyp in the right maxillary sinus. Electronically Signed   By: Signa Kell M.D.   On: 10/29/2020 07:38   CT Angio Chest Pulmonary Embolism (PE) W or WO Contrast  Result Date: 10/29/2020 CLINICAL DATA:  Acute chest pain shortness of breath, recent cervical spine fusion 2 days ago EXAM: CT ANGIOGRAPHY CHEST WITH CONTRAST TECHNIQUE: Multidetector CT imaging of the  chest was performed using the standard protocol during bolus administration of intravenous contrast. Multiplanar CT image reconstructions and MIPs were obtained to evaluate the vascular anatomy. CONTRAST:  25mL OMNIPAQUE IOHEXOL 350 MG/ML SOLN COMPARISON:  None. FINDINGS: Cardiovascular: Central and proximal hilar pulmonary vasculature appear patent without significant acute filling defect or pulmonary embolus. Limited assessment of the lower lobe peripheral segmental branches because of low lung volumes and bibasilar collapse/consolidation. Difficult to exclude small peripheral PE. Normal heart size. Intact thoracic aorta. Negative for aneurysm or dissection. No mediastinal hemorrhage or hematoma. Central venous structures appear patent. No veno-occlusive process. Mediastinum/Nodes: No enlarged mediastinal, hilar, or axillary lymph nodes. Thyroid gland, trachea, and esophagus demonstrate no significant findings. Lungs/Pleura: Low lung volumes with bibasilar atelectasis/consolidation. Difficult to exclude basilar pneumonia. Lingula and right middle lobe atelectasis also  noted. No large effusion, pleural abnormality or pneumothorax. Trachea and central airways appear patent. Upper Abdomen: Diffuse hepatic steatosis noted of the imaged portion of the liver. No biliary obstruction pattern. Left upper quadrant subdiaphragmatic low-attenuation complex cystic lesion with macroscopic fat and septal calcifications this appears to originate from the splenic upper pole. This measures 5 cm and has a indolent chronic appearance. Recommend follow-up dedicated abdomen CT without and with contrast in 3 months as an outpatient. Musculoskeletal: No acute osseous finding. Minor endplate degenerative changes. No compression fracture. Sternum intact. Review of the MIP images confirms the above findings. IMPRESSION: Negative for significant acute central or proximal hilar pulmonary embolus. Limited assessment of the peripheral segmental  branches for small PE evaluation. Dense bibasilar collapse/consolidation and additional areas of right middle lobe and lingula atelectasis. Hepatic steatosis Indeterminate left upper quadrant 5 cm complex septated cystic lesion with macroscopic fat and central calcifications measuring up to 5 cm. See above comment and recommendation. Electronically Signed   By: Judie Petit.  Shick M.D.   On: 10/29/2020 11:49   CT Cervical Spine Wo Contrast  Result Date: 10/29/2020 CLINICAL DATA:  Seizure.  Unresponsive. EXAM: CT CERVICAL SPINE WITHOUT CONTRAST TECHNIQUE: Multidetector CT imaging of the cervical spine was performed without intravenous contrast. Multiplanar CT image reconstructions were also generated. COMPARISON:  None. FINDINGS: Alignment: Normal Skull base and vertebrae: No acute fracture. No primary bone lesion or focal pathologic process. Soft tissues and spinal canal: There is no signs of canal hematoma. Diffuse prevertebral soft tissue edema is identified with a few foci of gas within the retropharyngeal and prevertebral soft tissues compatible with recent ACDF. Disc levels: Status post ACDF at C4-5. stable position of the anterior sideplate and screw device with interbody plug. Degenerative disc disease identified at C5-6 and C6-7. Upper chest: Negative. Other: None. IMPRESSION: 1. No evidence for cervical spine fracture. 2. Status post ACDF at C4-5. 3. Diffuse prevertebral soft tissue edema with a few foci of gas within the retropharyngeal and prevertebral soft tissues compatible with recent ACDF. Electronically Signed   By: Signa Kell M.D.   On: 10/29/2020 09:23   DG C-Arm 1-60 Min  Result Date: 10/27/2020 CLINICAL DATA:  C4-5 ACDF EXAM: CERVICAL SPINE - 2-3 VIEW; DG C-ARM 1-60 MIN COMPARISON:  09/12/2020 FINDINGS: Single lateral intraoperative fluoroscopic image the cervical spine demonstrates anterior cervical fusion hardware at the C4-5 level. Endotracheal tube is present. 15 seconds of fluoroscopy time  was utilized. Please refer to performing physicians operative note for further detail. IMPRESSION: As above. Electronically Signed   By: Duanne Guess D.O.   On: 10/27/2020 15:18   DG C-Arm 1-60 Min  Result Date: 10/27/2020 CLINICAL DATA:  C4-5 ACDF EXAM: CERVICAL SPINE - 2-3 VIEW; DG C-ARM 1-60 MIN COMPARISON:  09/12/2020 FINDINGS: Single lateral intraoperative fluoroscopic image the cervical spine demonstrates anterior cervical fusion hardware at the C4-5 level. Endotracheal tube is present. 15 seconds of fluoroscopy time was utilized. Please refer to performing physicians operative note for further detail. IMPRESSION: As above. Electronically Signed   By: Duanne Guess D.O.   On: 10/27/2020 15:18     EKG: I have personally reviewed.  Sinus rhythm, QTC 458, LAD, low voltage   Assessment/Plan Principal Problem:   Seizure (HCC) Active Problems:   Cervical myelopathy (HCC)   HLD (hyperlipidemia)   Hypothyroidism   OSA on CPAP   Chest pain  Seizure: CT head negative for acute intracranial abnormalities.  Dr. Otelia Limes of neurology is consulted, who recommended to  get EEG and MRI for brain.  Patient received 1 dose of Keppra 500 mg, will not continue Keppra per Dr. Otelia Limes.  -will place in progressive bed for observation -Seizure precaution -When necessary Ativan for seizure -EEG and MRI of the brain -Discontinue tramadol per Dr. Otelia Limes. -Frequent neuro check  Will -As needed Percocet, Tylenol, morphine, ketorolac Reviewed  HLD (hyperlipidemia) -Crestor  Hypothyroidism -Synthroid  OSA  -hold CPAP until mental status improves  Chest pain: Etiology is not clear.  CT angiogram of chest is limited study, but is negative for central PE.  Troponin level 5 -->4 -Trend troponin -Check A1c, FLP -Aspirin 81 mg daily     Splenic lesion: CTA showed  a left upper quadrant subdiaphragmatic low-attenuation complex cystic lesion with macroscopic fat and septal calcifications this  appears to originate from the splenic upper pole. This measures 5 cm and has a indolent chronic appearance.  -need to f/u with PCP with dedicated abdomen CT without and with contrast in 3 months as an outpatient.    DVT ppx: SCD Code Status: Full code Family Communication: Yes, patient's two daughters  at bed side Disposition Plan:  Anticipate discharge back to previous environment Consults called:  Dr. Otelia Limes of neuro Admission status and Level of care: Progressive Cardiac:    for obs    Status is: Observation  The patient remains OBS appropriate and will d/c before 2 midnights.  Dispo: The patient is from: Home              Anticipated d/c is to: Home              Patient currently is not medically stable to d/c.   Difficult to place patient No           Date of Service 10/29/2020    Lorretta Harp [facility]   If 7PM-7AM, please contact night-coverage [email or web address] 10/29/2020, 1:43 PM

## 2020-10-29 NOTE — Consult Note (Signed)
Neurosurgery-New Consultation Evaluation 10/29/2020 Leslie Meadows [id number]   Physician Requesting Consultation: No ref. provider found  History of Present Illness: Leslie Meadows is a 50 y.o who underwent a C4-5 ACDF on 10/27/20 for cervical stenosis and myelopathy.  She was admitted overnight for observation and had an episode of crying and feeling of being unable to get her words out on the morning of POD#1. This resolved with medication changes and she was ultimately able to discharge home later in the afternoon on 10/28/20. This morning she experience a recurrent episode aroung 5am when she was going to the bathroom. Her daughter was concerned for a seizure and subsequently brought her to the ER for further evaluation. She is noted to be alert and oriented at presentation and was taken for a head CT and further work-up for possible seizure. She is now reporting continued neck pain with associated diffuse headache worse in the back of her head as well as weakness in her bilateral lower extremities.  She currently has a pure wick in place and states that she is not have the sensation that she is urinating.  Past Medical History:  Past Medical History:  Diagnosis Date   HLD (hyperlipidemia)    Sleep apnea    Thyroid disease     Social History: Social History   Socioeconomic History   Marital status: Divorced    Spouse name: Not on file   Number of children: Not on file   Years of education: Not on file   Highest education level: Not on file  Occupational History   Not on file  Tobacco Use   Smoking status: Never   Smokeless tobacco: Never  Vaping Use   Vaping Use: Never used  Substance and Sexual Activity   Alcohol use: No    Alcohol/week: 0.0 standard drinks   Drug use: No   Sexual activity: Not on file  Other Topics Concern   Not on file  Social History Narrative   Not on file   Social Determinants of Health   Financial Resource Strain: Not on file  Food Insecurity: Not  on file  Transportation Needs: Not on file  Physical Activity: Not on file  Stress: Not on file  Social Connections: Not on file  Intimate Partner Violence: Not on file    Family History: Family History  Problem Relation Age of Onset   Breast cancer Paternal Grandmother     Review of Systems:  Review of Systems - General ROS: Negative Psychological ROS: Negative Ophthalmic ROS: Negative ENT ROS: Negative Hematological and Lymphatic ROS: Negative  Endocrine ROS: Negative Respiratory ROS: Negative Cardiovascular ROS: Negative Gastrointestinal ROS: Negative Genito-Urinary ROS: Negative Musculoskeletal ROS: Negative Neurological ROS: Negative Dermatological ROS: Negative  Physical Exam: BP 114/86   Pulse 75   Temp 98.6 F (37 C) (Oral)   Resp 19   SpO2 100%  There is no height or weight on file to calculate BMI. There is no height or weight on file to calculate BSA. General appearance: Alert, cooperative, in no acute distress Head: Normocephalic, atraumatic Eyes: Normal, EOM intact Oropharynx: Moist without lesions Neck: Supple.  Trachea is midline. Abdomen: Soft, nondistended Ext: No edema in LE bilaterally, good distal pulses  Neurologic exam:  Mental status: alertness: alert, orientation: person, place, time, affect: normal Speech: fluent and clear Cranial nerves:  CN II-XII grossly intact Motor:4-/5 in BUE. 2/5 bilateral HF, KE, 2/5 bilateral DF and EHL, 4/5 bilateral PF. normal muscle mass and tone in all extremities  and no pronator drift Sensory: decreased sensation to light touch throughout RLE Reflexes: 2+ and symmetric bilaterally for arms and legs Coordination: intact finger to nose  Laboratory: Results for orders placed or performed during the hospital encounter of 10/29/20  Resp Panel by RT-PCR (Flu A&B, Covid) Nasopharyngeal Swab   Specimen: Nasopharyngeal Swab; Nasopharyngeal(NP) swabs in vial transport medium  Result Value Ref Range   SARS  Coronavirus 2 by RT PCR NEGATIVE NEGATIVE   Influenza A by PCR NEGATIVE NEGATIVE   Influenza B by PCR NEGATIVE NEGATIVE  Comprehensive metabolic panel  Result Value Ref Range   Sodium 138 135 - 145 mmol/L   Potassium 3.8 3.5 - 5.1 mmol/L   Chloride 102 98 - 111 mmol/L   CO2 27 22 - 32 mmol/L   Glucose, Bld 107 (H) 70 - 99 mg/dL   BUN 20 6 - 20 mg/dL   Creatinine, Ser 3.55 (H) 0.44 - 1.00 mg/dL   Calcium 9.5 8.9 - 73.2 mg/dL   Total Protein 7.8 6.5 - 8.1 g/dL   Albumin 4.1 3.5 - 5.0 g/dL   AST 23 15 - 41 U/L   ALT 20 0 - 44 U/L   Alkaline Phosphatase 52 38 - 126 U/L   Total Bilirubin 0.6 0.3 - 1.2 mg/dL   GFR, Estimated >20 >25 mL/min   Anion gap 9 5 - 15  CBC with Differential/Platelet  Result Value Ref Range   WBC 8.1 4.0 - 10.5 K/uL   RBC 5.24 (H) 3.87 - 5.11 MIL/uL   Hemoglobin 14.6 12.0 - 15.0 g/dL   HCT 42.7 06.2 - 37.6 %   MCV 83.6 80.0 - 100.0 fL   MCH 27.9 26.0 - 34.0 pg   MCHC 33.3 30.0 - 36.0 g/dL   RDW 28.3 15.1 - 76.1 %   Platelets 234 150 - 400 K/uL   nRBC 0.0 0.0 - 0.2 %   Neutrophils Relative % 57 %   Neutro Abs 4.6 1.7 - 7.7 K/uL   Lymphocytes Relative 36 %   Lymphs Abs 2.9 0.7 - 4.0 K/uL   Monocytes Relative 6 %   Monocytes Absolute 0.5 0.1 - 1.0 K/uL   Eosinophils Relative 0 %   Eosinophils Absolute 0.0 0.0 - 0.5 K/uL   Basophils Relative 0 %   Basophils Absolute 0.0 0.0 - 0.1 K/uL   Immature Granulocytes 1 %   Abs Immature Granulocytes 0.07 0.00 - 0.07 K/uL  Magnesium  Result Value Ref Range   Magnesium 2.2 1.7 - 2.4 mg/dL  Ethanol  Result Value Ref Range   Alcohol, Ethyl (B) <10 <10 mg/dL  Urine Drug Screen, Qualitative  Result Value Ref Range   Tricyclic, Ur Screen NONE DETECTED NONE DETECTED   Amphetamines, Ur Screen NONE DETECTED NONE DETECTED   MDMA (Ecstasy)Ur Screen NONE DETECTED NONE DETECTED   Cocaine Metabolite,Ur [place] NONE DETECTED NONE DETECTED   Opiate, Ur Screen NONE DETECTED NONE DETECTED   Phencyclidine (PCP) Ur S NONE  DETECTED NONE DETECTED   Cannabinoid 50 Ng, Ur [place] NONE DETECTED NONE DETECTED   Barbiturates, Ur Screen NONE DETECTED NONE DETECTED   Benzodiazepine, Ur Scrn POSITIVE (A) NONE DETECTED   Methadone Scn, Ur NONE DETECTED NONE DETECTED  Acetaminophen level  Result Value Ref Range   Acetaminophen (Tylenol), Serum <10 (L) 10 - 30 ug/mL  Salicylate level  Result Value Ref Range   Salicylate Lvl <7.0 (L) 7.0 - 30.0 mg/dL  CBG monitoring, ED  Result Value Ref Range  Glucose-Capillary 93 70 - 99 mg/dL  POC urine preg, ED  Result Value Ref Range   Preg Test, Ur NEGATIVE NEGATIVE  Troponin I (High Sensitivity)  Result Value Ref Range   Troponin I (High Sensitivity) 5 <18 ng/L  Troponin I (High Sensitivity)  Result Value Ref Range   Troponin I (High Sensitivity) 4 <18 ng/L   I personally reviewed labs  Imaging: CTA PE IMPRESSION: Negative for significant acute central or proximal hilar pulmonary embolus. Limited assessment of the peripheral segmental branches for small PE evaluation.   Dense bibasilar collapse/consolidation and additional areas of right middle lobe and lingula atelectasis.   Hepatic steatosis   Indeterminate left upper quadrant 5 cm complex septated cystic lesion with macroscopic fat and central calcifications measuring up to 5 cm. See above comment and recommendation.     Electronically Signed   By: Judie Petit.  Shick M.D.   On: 10/29/2020 11:49  CT c-spine IMPRESSION: 1. No evidence for cervical spine fracture. 2. Status post ACDF at C4-5. 3. Diffuse prevertebral soft tissue edema with a few foci of gas within the retropharyngeal and prevertebral soft tissues compatible with recent ACDF.     Electronically Signed   By: Signa Kell M.D.   On: 10/29/2020 09:23  CT head IMPRESSION: 1. No acute intracranial abnormalities. Normal brain. 2. Retention cyst versus polyp in the right maxillary sinus.   Electronically Signed   By: Signa Kell M.D.   On:  10/29/2020 07:38  EEG Impression: This EEG was obtained while awake and asleep and is normal.  I personally reviewed radiology studies to include:   Impression/Plan:     1.  Diagnosis: AMS and lower extremity weakness. Concern for seizure vs pseudoseizure vs CVA, vs spinal stenosis vs encephalopathy   2.  Plan - continue with plan for MRI brain. Ordered MRI T and L spine for further evaluation of compressive pathology that explains her new lower extremity weakness - Will continue to follow - No plans for acute neurosurgical intervention at this time.   Manning Charity PA-C Neurosurgery

## 2020-10-29 NOTE — Consult Note (Addendum)
NEURO HOSPITALIST CONSULT NOTE   Requestig physician: Dr. Katrinka Blazing  Reason for Consult: Seizure versus pseudoseizure  History obtained from:  Family and Chart     HPI:                                                                                                                                          Leslie Meadows is a 50 y.o. female with a PMHx of HLD, sleep apnea and thyroid disease, who is also s/p C4-5 ANTERIOR CERVICAL DECOMPRESSION/DISCECTOMY with fusion on Monday with Dr. Adriana Simas, who presents to the ED via EMS for evaluation of possible seizure. The episode at home was described as lasting for as long as 5 minutes. Per daughter, the patient initially was at her baseline, up and around the house at 3 AM.  She asked for some more pain medicine and her daughter tried to help her. The daughter went to go get something and after she came back the patient had gotten herself back into her bed and was lying down. The daughter then witnessed the patient to start fluttering her eyes and also exhibit jerking movements; at that time, the patient was not talking or answering questions. EMS was then called.    She reportedly had a similar episode previously while she was in the hospital involving some shaking behaviors and altered mental status with inability to speak. This was thought to be due to side effects from her pain medication.    Initial assessment by EDP was with some functional attributes. Initial ED plan is to obtain imaging of her cervical spine to assess for stability, as well as MRI brain and EEG.   Home medications include Robaxin and Ultram.   Past Medical History:  Diagnosis Date   HLD (hyperlipidemia)    Sleep apnea    Thyroid disease     Past Surgical History:  Procedure Laterality Date   ABDOMINAL HYSTERECTOMY     ANTERIOR CERVICAL DECOMP/DISCECTOMY FUSION N/A 10/27/2020   Procedure: C4-5 ANTERIOR CERVICAL DECOMPRESSION/DISCECTOMY FUSION 1 LEVEL;  Surgeon:  Lucy Chris, MD;  Location: [facility] ORS;  Service: Neurosurgery;  Laterality: N/A;   TUBAL LIGATION      Family History  Problem Relation Age of Onset   Breast cancer Paternal Grandmother              Social History:  reports that she has never smoked. She has never used smokeless tobacco. She reports that she does not drink alcohol and does not use drugs.  No Known Allergies  MEDICATIONS:  No current facility-administered medications on file prior to encounter.   Current Outpatient Medications on File Prior to Encounter  Medication Sig Dispense Refill   cholecalciferol (VITAMIN D3) 25 MCG (1000 UNIT) tablet Take 1,000 Units by mouth daily.     levothyroxine (SYNTHROID, LEVOTHROID) 25 MCG tablet Take 12.5 mcg by mouth daily before breakfast.     methocarbamol (ROBAXIN) 500 MG tablet Take 1 tablet (500 mg total) by mouth every 6 (six) hours. 60 tablet 0   NON FORMULARY Pt uses a cpap nightly     rosuvastatin (CRESTOR) 40 MG tablet Take 40 mg by mouth at bedtime.     senna (SENOKOT) 8.6 MG TABS tablet Take 1 tablet (8.6 mg total) by mouth 2 (two) times daily. 120 tablet 0   traMADol (ULTRAM) 50 MG tablet Take 1 tablet (50 mg total) by mouth every 6 (six) hours as needed for up to 5 days. 20 tablet 0      ROS:                                                                                                                                       The patient is nonverbal except for a brief episode of short phrases. Unable to obtain ROS.   Blood pressure 132/84, pulse 86, temperature 98.6 F (37 C), temperature source Oral, resp. rate 16, SpO2 99 %.   General Examination:                                                                                                       Physical Exam  HEENT-  [place]/AT. Evidence for recent ACDF is noted.    Lungs- Respirations  unlabored Extremities- No edema  Neurological Examination Mental Status: Awake. Eyes are partially closed. Able to follow commands and mumble affirmative or negative replies to questions but is otherwise nonverbal, except for a brief period during which she started to utter phrases at the end of the exam. Was not able to answer orientation questions.  Cranial Nerves: II: Temporal visual fields intact without extinction to DSS (gestures with both hands). PERRL.   III,IV, VI: Did track to the left and right normally.  V: Nods head slightly to indicate that sensation to cold is equal bilaterally VII: Lips are held pursed together tightly during exam. Lips quiver and affect becomes effortful when patient is asked by family to smile. No asymmetry noted.  VIII: Hearing intact to questions and commands.  IX,X: Is apparently unable to  open mouth for assessment of palatal elevation. Effortful affect and quivering lips when asked to open mouth.  XI: Head is midline. XII: Does not protrude tongue to command Motor: Decreased effort with testing of upper extremity strength. Tone is normal. No asymmetry noted. Decreased effort with testing of BLE strength. Does not lift antigravity or flex at knees. Does not dorsiflex feet. Wiggles toes slightly. Tone is normal. No definite asymmetry.  Sensory: Temp and FT intact x 4.  Deep Tendon Reflexes: 1+ and symmetric throughout Plantars: Right: downgoing   Left: downgoing Cerebellar: Slow, effortful FNF without ataxia bilaterally. Unable to assess H-S.  Gait: Unable to assess   Lab Results: Basic Metabolic Panel: Recent Labs  Lab 10/28/20 0909 10/29/20 0632  NA 137 138  K 4.6 3.8  CL 102 102  CO2 27 27  GLUCOSE 122* 107*  BUN 10 20  CREATININE 0.80 1.01*  CALCIUM 9.6 9.5  MG  --  2.2    CBC: Recent Labs  Lab 10/28/20 0909 10/29/20 0632  WBC 11.0* 8.1  NEUTROABS  --  4.6  HGB 14.8 14.6  HCT 43.7 43.8  MCV 84.9 83.6  PLT 244 234    Cardiac  Enzymes: No results for input(s): CKTOTAL, CKMB, CKMBINDEX, TROPONINI in the last 168 hours.  Lipid Panel: No results for input(s): CHOL, TRIG, HDL, CHOLHDL, VLDL, LDLCALC in the last 168 hours.  Imaging: DG Cervical Spine 2 or 3 views  Result Date: 10/27/2020 CLINICAL DATA:  C4-5 ACDF EXAM: CERVICAL SPINE - 2-3 VIEW; DG C-ARM 1-60 MIN COMPARISON:  09/12/2020 FINDINGS: Single lateral intraoperative fluoroscopic image the cervical spine demonstrates anterior cervical fusion hardware at the C4-5 level. Endotracheal tube is present. 15 seconds of fluoroscopy time was utilized. Please refer to performing physicians operative note for further detail. IMPRESSION: As above. Electronically Signed   By: Duanne Guess D.O.   On: 10/27/2020 15:18   CT HEAD WO CONTRAST  Result Date: 10/29/2020 CLINICAL DATA:  Seizure. EXAM: CT HEAD WITHOUT CONTRAST TECHNIQUE: Contiguous axial images were obtained from the base of the skull through the vertex without intravenous contrast. COMPARISON:  None. FINDINGS: Brain: No evidence of acute infarction, hemorrhage, hydrocephalus, extra-axial collection or mass lesion/mass effect. Vascular: No hyperdense vessel or unexpected calcification. Skull: Normal. Negative for fracture or focal lesion. Sinuses/Orbits: Retention cyst versus polyp noted in the right maxillary sinus. Other: None. IMPRESSION: 1. No acute intracranial abnormalities. Normal brain. 2. Retention cyst versus polyp in the right maxillary sinus. Electronically Signed   By: Signa Kell M.D.   On: 10/29/2020 07:38   DG C-Arm 1-60 Min  Result Date: 10/27/2020 CLINICAL DATA:  C4-5 ACDF EXAM: CERVICAL SPINE - 2-3 VIEW; DG C-ARM 1-60 MIN COMPARISON:  09/12/2020 FINDINGS: Single lateral intraoperative fluoroscopic image the cervical spine demonstrates anterior cervical fusion hardware at the C4-5 level. Endotracheal tube is present. 15 seconds of fluoroscopy time was utilized. Please refer to performing physicians  operative note for further detail. IMPRESSION: As above. Electronically Signed   By: Duanne Guess D.O.   On: 10/27/2020 15:18   DG C-Arm 1-60 Min  Result Date: 10/27/2020 CLINICAL DATA:  C4-5 ACDF EXAM: CERVICAL SPINE - 2-3 VIEW; DG C-ARM 1-60 MIN COMPARISON:  09/12/2020 FINDINGS: Single lateral intraoperative fluoroscopic image the cervical spine demonstrates anterior cervical fusion hardware at the C4-5 level. Endotracheal tube is present. 15 seconds of fluoroscopy time was utilized. Please refer to performing physicians operative note for further detail. IMPRESSION: As above. Electronically Signed  By: Duanne Guess D.O.   On: 10/27/2020 15:18     Assessment: 50 year old female who presents by EMS for evaluation of possible seizure.  1. Exam reveals findings that are suggestive of an underlying functional etiology for her presentation. Exam is significantly compromised by decreased effort; no clear localizing findings are noted. Decreased limb movement but with normal tone and reflexes. The patient is mute but able to answer all questions by gesturing and nodding head slightly. Purses lips tightly with effortful affect and lip quivering when asked to open them, which appears functional.  2. Na, Ca and Mg are normal.  3. CT head: No acute intracranial abnormalities. Normal brain. 4. Home medications include Robaxin and Ultram. Of note, Ultram can lower the seizure threshold.  5. EDP has ordered imaging of her cervical spine to assess for stability. Results may need to be discussed with Neurosurgery.  6. EEG: This EEG was obtained while awake and asleep and is normal.  Recommendations: 1. Agree with ED plan to obtain MRI brain .  2. Discontinue Ultram. Will likely need an opiate medication as a substitute.  3. Discontinue Keppra. If her spell was a seizure it was most likely provoked by Ultram use. 4. Close monitoring with frequent neuro checks.  5. Will defer to Neurosurgery regarding  whether or not MRI cervical spine also indicated to fully rule out a postsurgical myelopathy given the decreased movement of her limbs on exam.    Addendum: MRI brain: No acute brain finding. Few small foci of T2 and FLAIR signal within the right frontal white matter and at the gray-white junction. These are often seen in asymptomatic individuals, but could conceivably relate to seizure.    Electronically signed: Dr. Caryl Pina 10/29/2020, 8:39 AM

## 2020-10-29 NOTE — ED Triage Notes (Signed)
T brought in per EMS from home for reports of witnessed seizure and unresponsiveness after. No meds given PTA. Pt presents to ED openning eyes to verbal stimuli, localizes pain stimuli. Respi even-unlabored. In NAD

## 2020-10-29 NOTE — ED Provider Notes (Signed)
I assumed care of this patient from outgoing provider at approximately 0 700.  Please see the providers note for full details regarding patient's initial evaluation assessment.  In brief patient presents with concern for possible seizure noted by family earlier this morning.  On arrival after not receiving any benzodiazepines or antiepileptics patient is fairly well.  And not participatory in interview or significant exam.  Plan is to follow-up CT head and basic labs and admit for possible new seizure versus work-up of other unclear encephalopathy.  CT head is is unremarkable.  Labs including CMP, CBC, [place] ethanol, salicylate level and acetaminophen level all unremarkable.  Discussed with on-call neurologist Dr. Otelia Limes who recommended EEG and MRI.  Also discussed with on-call neurosurgeon Dr. Marcell Barlow who will see the patient later today.  I will also obtain a CT C-spine given recent cervical surgery and patient not moving any of her extremities on exam.  I will plan to admit to medicine service for further evaluation and management.   Gilles Chiquito, MD 10/29/20 [phone]

## 2020-10-29 NOTE — ED Notes (Signed)
Patient transported to CT 

## 2020-10-29 NOTE — ED Notes (Signed)
EEG at bedside.

## 2020-10-29 NOTE — Progress Notes (Signed)
Eeg done 

## 2020-10-30 ENCOUNTER — Observation Stay: Payer: Worker's Compensation

## 2020-10-30 DIAGNOSIS — M4802 Spinal stenosis, cervical region: Secondary | ICD-10-CM

## 2020-10-30 DIAGNOSIS — R7303 Prediabetes: Secondary | ICD-10-CM

## 2020-10-30 DIAGNOSIS — R569 Unspecified convulsions: Secondary | ICD-10-CM | POA: Diagnosis not present

## 2020-10-30 LAB — LIPID PANEL
Cholesterol: 207 mg/dL — ABNORMAL HIGH (ref 0–200)
HDL: 44 mg/dL (ref >40)
LDL Cholesterol: 142 mg/dL — ABNORMAL HIGH (ref 0–99)
Total CHOL/HDL Ratio: 4.7 RATIO
Triglycerides: 106 mg/dL (ref <150)
VLDL: 21 mg/dL (ref 0–40)

## 2020-10-30 LAB — BASIC METABOLIC PANEL
Anion gap: 12 (ref 5–15)
BUN: 15 mg/dL (ref 6–20)
CO2: 26 mmol/L (ref 22–32)
Calcium: 9.1 mg/dL (ref 8.9–10.3)
Chloride: 97 mmol/L — ABNORMAL LOW (ref 98–111)
Creatinine, Ser: 0.81 mg/dL (ref 0.44–1.00)
GFR, Estimated: >60 mL/min (ref >60)
Glucose, Bld: 104 mg/dL — ABNORMAL HIGH (ref 70–99)
Potassium: 3.7 mmol/L (ref 3.5–5.1)
Sodium: 135 mmol/L (ref 135–145)

## 2020-10-30 LAB — HEMOGLOBIN A1C
Hgb A1c MFr Bld: 6.3 % — ABNORMAL HIGH (ref 4.8–5.6)
Mean Plasma Glucose: 134 mg/dL

## 2020-10-30 LAB — CBG MONITORING, ED: Glucose-Capillary: 102 mg/dL — ABNORMAL HIGH (ref 70–99)

## 2020-10-30 LAB — HIV ANTIBODY (ROUTINE TESTING W REFLEX): HIV Screen 4th Generation wRfx: NONREACTIVE

## 2020-10-30 IMAGING — MR MR LUMBAR SPINE W/O CM
4 of 5 series · 29 of 48 positions shown · non-contrast
Comparison: Comparison made with previous MRI from [DATE].

CLINICAL DATA: 49-year-old female status post recent C4-5 ACDF, now
with bilateral lower extremity weakness, loss of sensation with
urination.

EXAM:
MRI THORACIC AND LUMBAR SPINE WITHOUT CONTRAST
TECHNIQUE: Multiplanar and multiecho pulse sequences of the thoracic and lumbar
spine were obtained without intravenous contrast.

[Series 1: T2 · sagittal · 4.0mm · 0.81mm/px · 6 of 17 slices shown (1 of 2)]
[im 1/17]
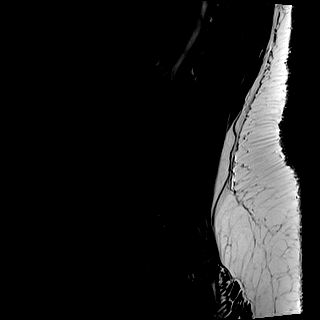
[im 4/17]
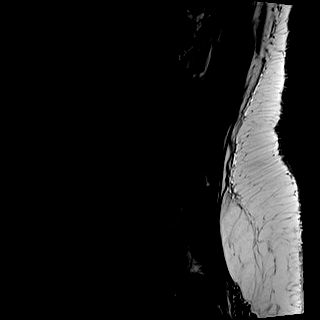
[im 7/17]
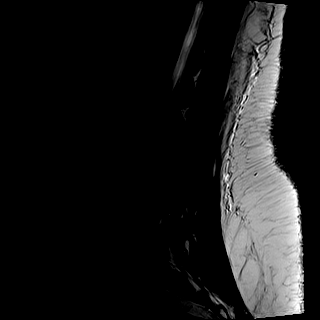
[im 10/17]
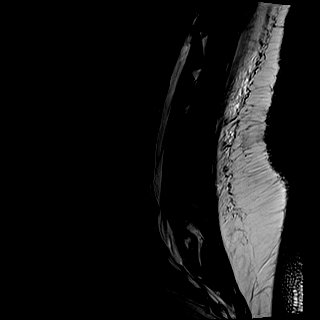
[im 13/17]
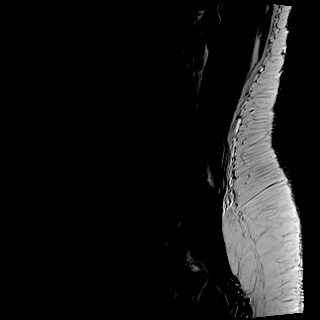
[im 17/17]
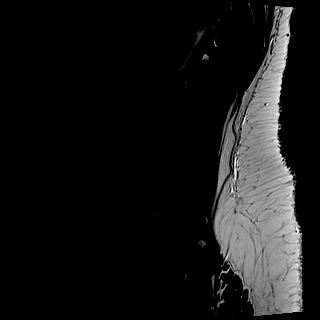

[Series 2: T1 · sagittal · 4.0mm · 0.81mm/px · 7 of 17 slices shown (1 of 2)]
[im 1/17]
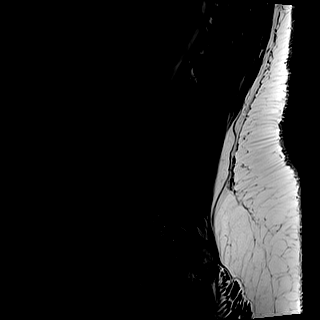
[im 3/17]
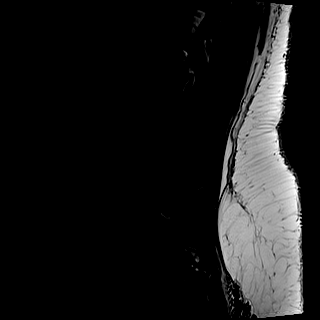
[im 6/17]
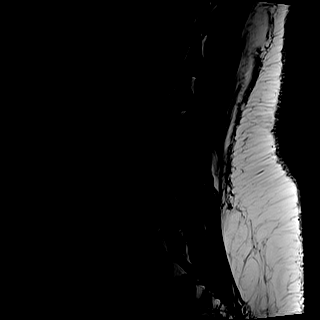
[im 9/17]
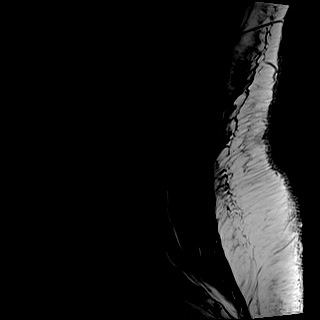
[im 11/17]
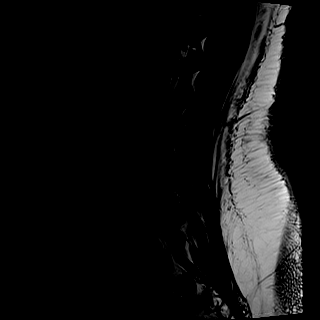
[im 14/17]
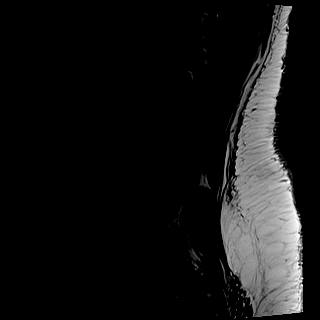
[im 17/17]
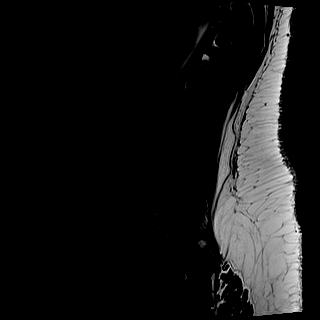

[Series 4: T2 · axial · 4.0mm · 0.78mm/px · z∈[-540,-332]mm · 8 of 36 slices shown (2 of 2)]
[im 1/36]
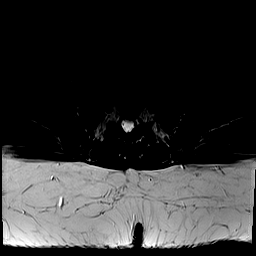
[im 6/36]
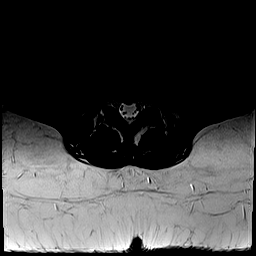
[im 11/36]
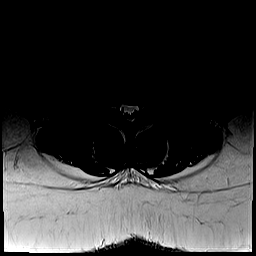
[im 17/36]
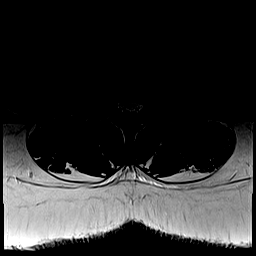
[im 19/36]
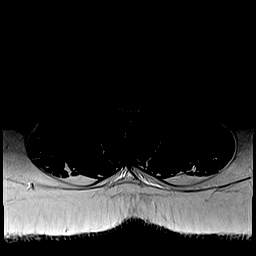
[im 25/36]
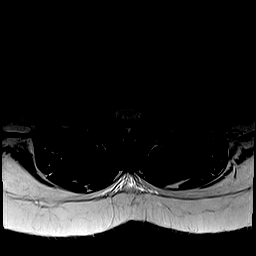
[im 30/36]
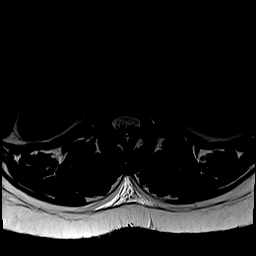
[im 36/36]
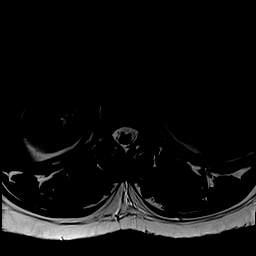

[Series 5: T1 · axial · 4.0mm · 0.39mm/px · z∈[-540,-332]mm · 8 of 36 slices shown (2 of 2)]
[im 1/36]
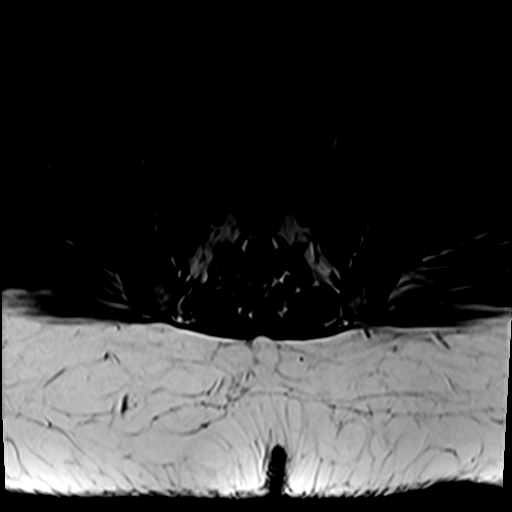
[im 6/36]
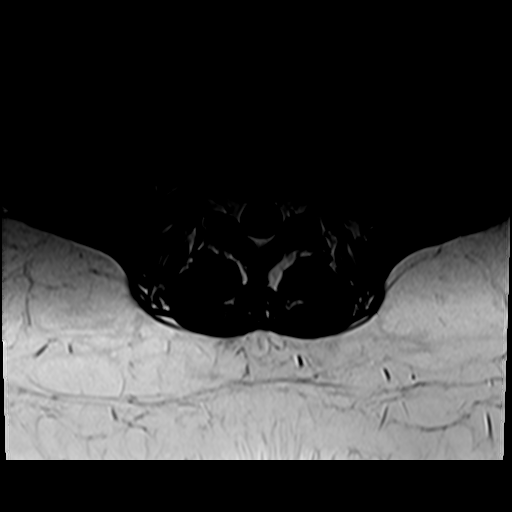
[im 11/36]
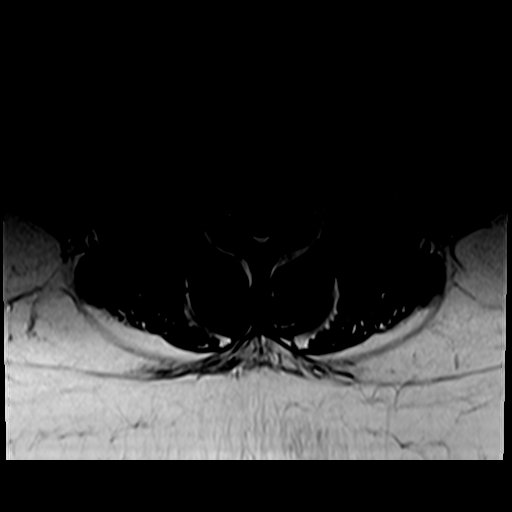
[im 17/36]
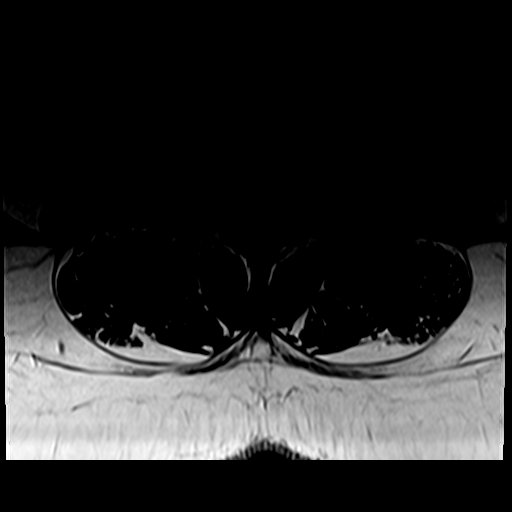
[im 19/36]
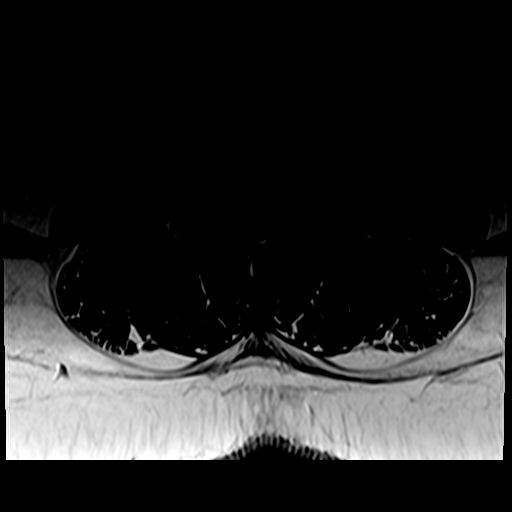
[im 25/36]
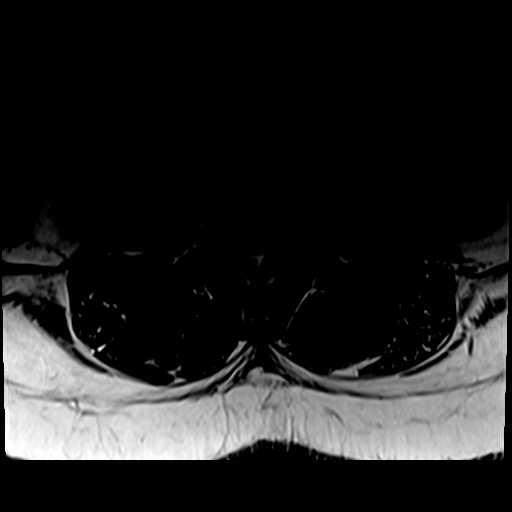
[im 30/36]
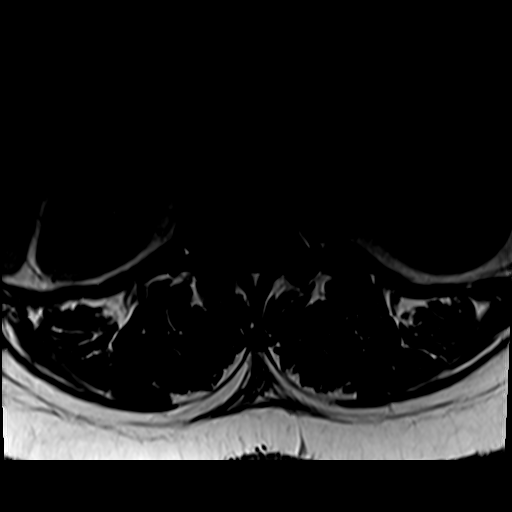
[im 36/36]
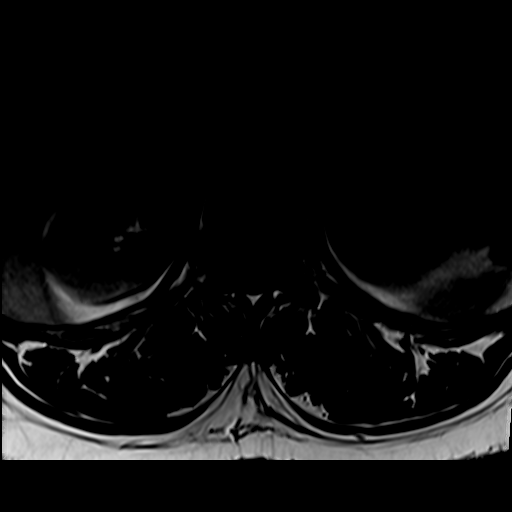

[29 of 48 positions shown; findings below may reference images not displayed]

FINDINGS: MRI THORACIC SPINE FINDINGS

Alignment: Physiologic with preservation of the normal thoracic
kyphosis.

Vertebrae: Vertebral body height maintained without acute or chronic
fracture. Bone marrow signal intensity somewhat heterogeneous and
diffusely decreased on T1 weighted imaging, nonspecific, but most
commonly related to anemia, smoking or obesity. No worrisome osseous
lesions. No abnormal marrow edema.

Cord: Normal signal and morphology. No epidural hematoma or other
collection.

Paraspinal and other soft tissues: Paraspinous soft tissues within
normal limits. Dependent atelectatic changes noted within the
visualized lungs. Complex cystic lesion noted at the anteromedial
aspect of the spleen, nonspecific, but possibly related to prior
trauma.

Disc levels:

C7-T1: Central disc protrusion indents the ventral thecal sac,
contacting the ventral cord (series 22, image 2). No significant
stenosis or cord deformity. Foramina remain patent.

T6-7: Right paracentral disc protrusion indents the right ventral
thecal sac, contacting and mildly flattening the ventral cord
(series 22, image 20). No cord signal changes or significant
stenosis. Foramina remain patent.

T7-8: Mild left eccentric disc bulge.  No significant stenosis.

No other significant disc pathology seen within the thoracic spine.
No significant spinal stenosis. Foramina remain patent.

MRI LUMBAR SPINE FINDINGS

Segmentation: Standard. Lowest well-formed disc space labeled the
L5-S1 level.

Alignment: Physiologic with preservation of the normal lumbar
lordosis. No listhesis.

Vertebrae: Vertebral body height maintained without acute or
interval fracture. Bone marrow signal intensity mildly heterogeneous
and diffusely decreased on T1 weighted imaging, nonspecific, but
most commonly related to anemia, smoking, or obesity. Few small
benign hemangiomata noted. No worrisome osseous lesions. No abnormal
marrow edema.

Conus medullaris and cauda equina: Conus extends to the T12-L1
level. Conus and cauda equina appear normal.

Paraspinal and other soft tissues: Paraspinous soft tissues within
normal limits. Visualized visceral structures are normal.

Disc levels:

L1-2:  Unremarkable.

L2-3: Minimal left eccentric disc bulge. No spinal stenosis.
Foramina remain patent.

L3-4: Mild disc bulge. Superimposed small biforaminal disc
protrusions closely approximate and/or contacts both of the exiting
L3 nerve roots as they course out of the neural foramina (series 4,
image 23), slightly greater on the right. Minimal facet hypertrophy.
No spinal stenosis. Foramina remain patent.

L4-5: Disc bulge with disc desiccation. Superimposed left foraminal
disc protrusion with annular fissure, closely approximating and/or
contacting the exiting left L4 nerve root (series 4, image 27). Mild
narrowing of the left lateral recess with mild left foraminal
stenosis. Right neural foramen remains patent as does the central
canal.

L5-S1:  Unremarkable.
IMPRESSION: MR THORACIC SPINE IMPRESSION:

1. Normal MRI appearance of the thoracic spinal cord. No significant
spinal stenosis or findings to explain patient's symptoms.
2. Small disc protrusions at C7-T1 and T6-7 without significant
stenosis.
3. Complex cystic lesion at the anterior aspect of the spleen,
partially visualized. Finding is indeterminate, but could be related
to remote trauma or other insult. Further evaluation with dedicated
cross-sectional imaging of the abdomen and pelvis suggested for
complete characterization.

MRI LUMBAR SPINE IMPRESSION:

1. Normal MRI appearance of the conus medullaris and cauda equina.
No high-grade spinal stenosis or findings to explain patient's
symptoms.
2. Left foraminal disc protrusion with annular fissure at L4-5,
potentially affecting the exiting left L4 nerve root.
3. Small biforaminal disc protrusions at L3-4. Either of the exiting
L3 nerve roots could be affected.

## 2020-10-30 IMAGING — MR MR THORACIC SPINE W/O CM
5 of 6 series · 27 of 48 positions shown · non-contrast
Comparison: Comparison made with previous MRI from [DATE].

CLINICAL DATA: 49-year-old female status post recent C4-5 ACDF, now
with bilateral lower extremity weakness, loss of sensation with
urination.

EXAM:
MRI THORACIC AND LUMBAR SPINE WITHOUT CONTRAST
TECHNIQUE: Multiplanar and multiecho pulse sequences of the thoracic and lumbar
spine were obtained without intravenous contrast.

[Series 16: T1 · sagittal · 5.0mm · 1.88mm/px · 2 of 9 slices shown (1 of 2)]
[im 1/9]
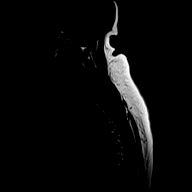
[im 9/9]
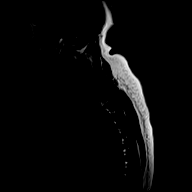

[Series 19: T2 · sagittal · 3.0mm · 1.06mm/px · 6 of 17 slices shown (1 of 2)]
[im 1/17]
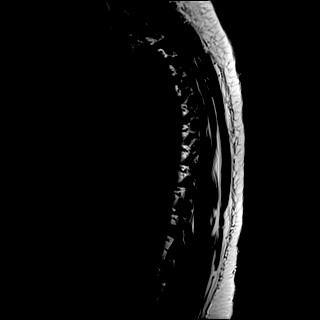
[im 4/17]
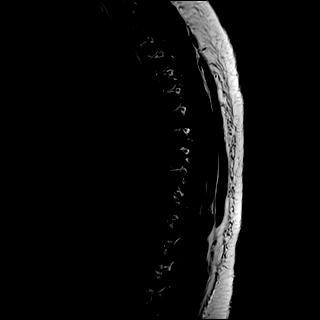
[im 7/17]
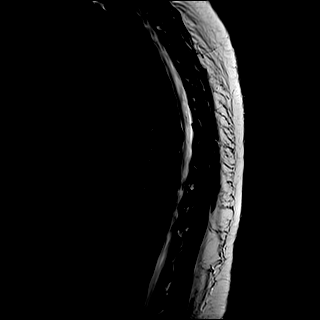
[im 10/17]
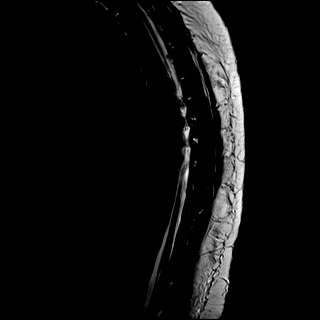
[im 13/17]
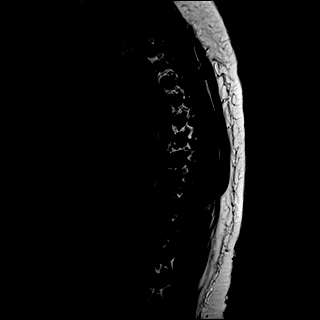
[im 17/17]
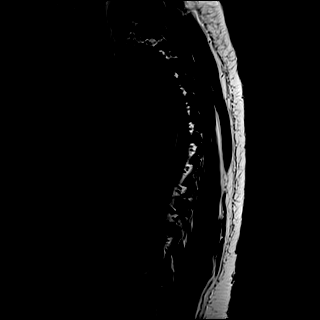

[Series 20: T1 · sagittal · 3.0mm · 1.06mm/px · 6 of 17 slices shown (2 of 2)]
[im 1/17]
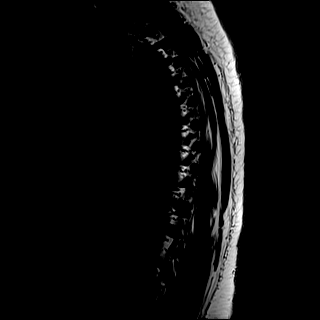
[im 4/17]
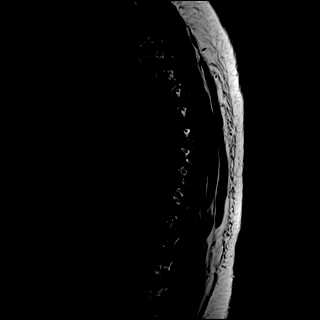
[im 7/17]
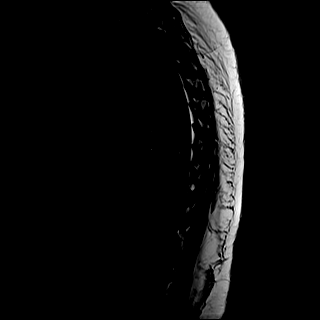
[im 10/17]
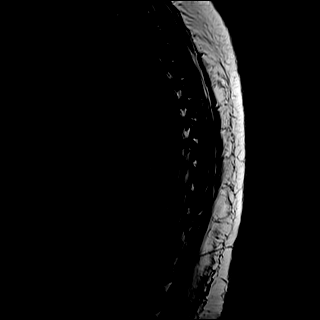
[im 13/17]
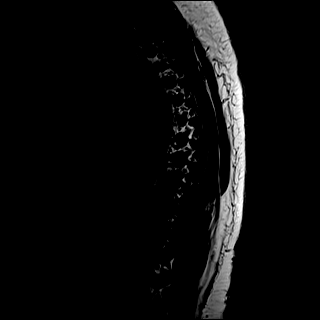
[im 17/17]
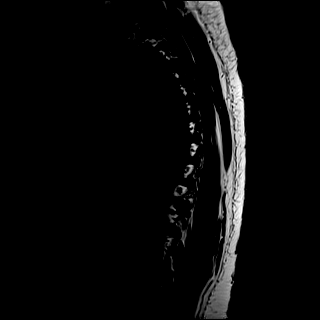

[Series 21: STIR · sagittal · 3.0mm · 0.53mm/px · 5 of 17 slices shown]
[im 1/17]
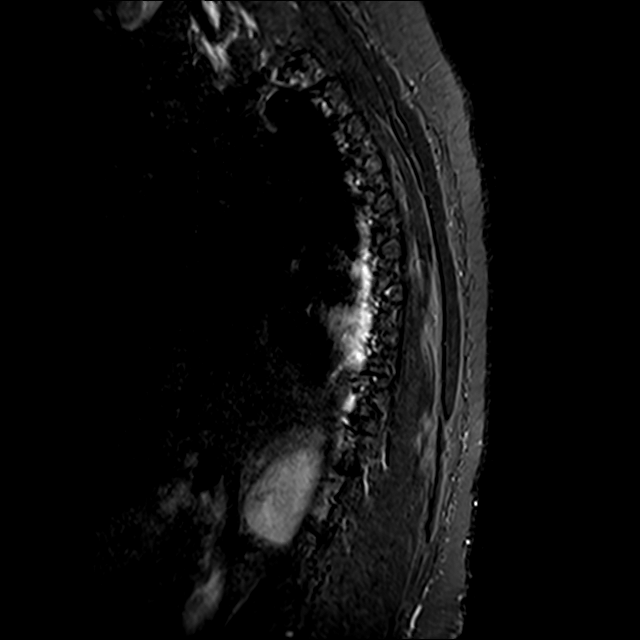
[im 4/17]
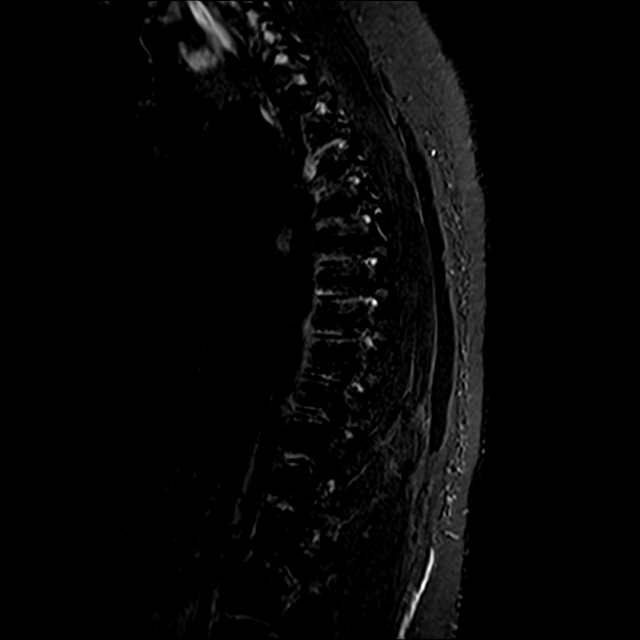
[im 7/17]
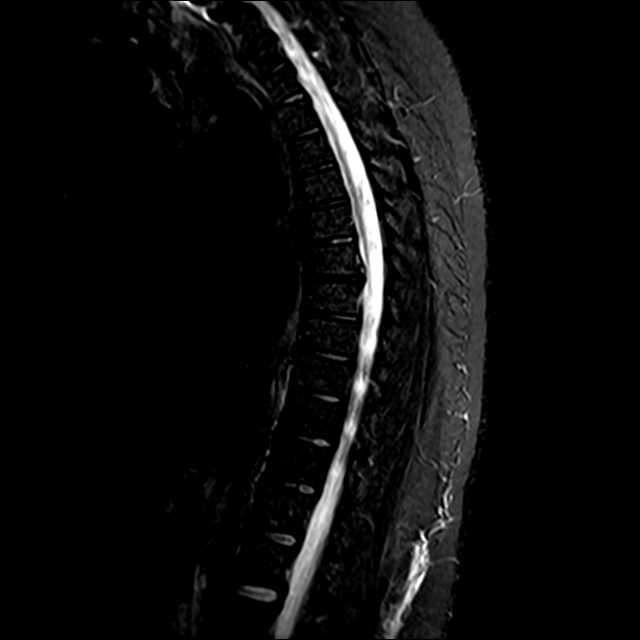
[im 10/17]
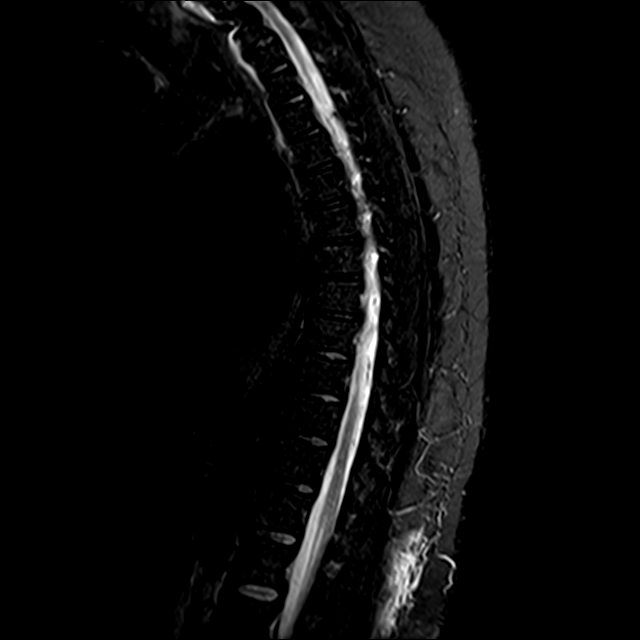
[im 13/17]
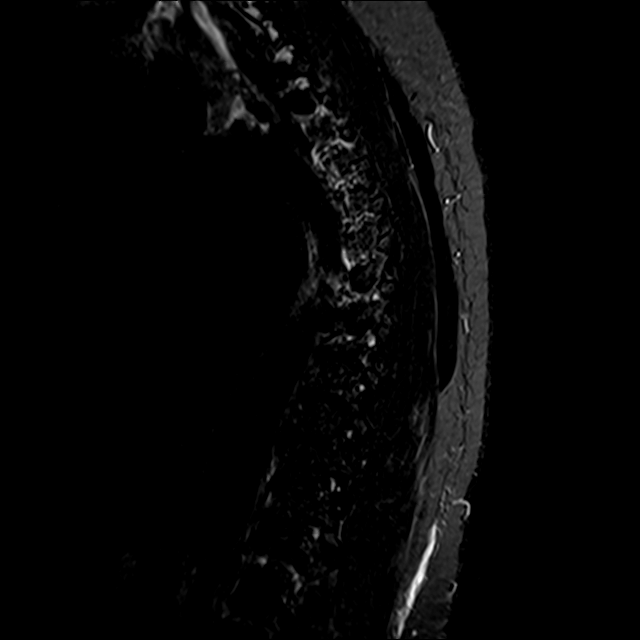

[Series 22: T2 · axial · 4.0mm · 0.59mm/px · z∈[-337,-134]mm · 8 of 39 slices shown (2 of 2)]
[im 1/39]
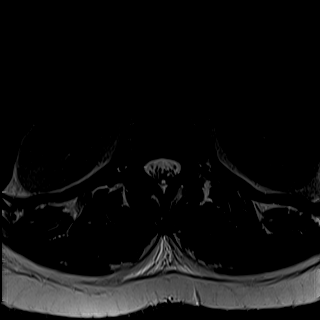
[im 6/39]
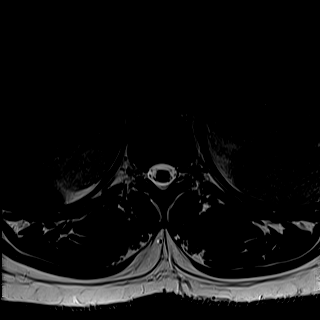
[im 12/39]
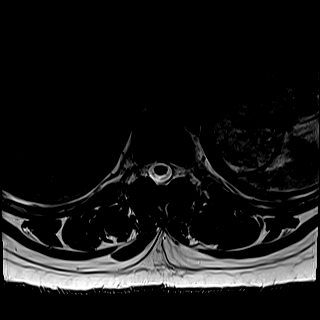
[im 18/39]
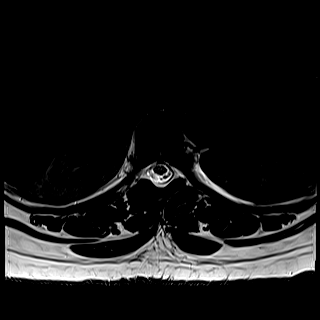
[im 21/39]
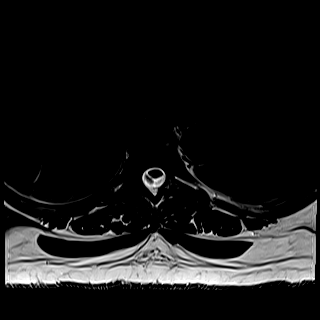
[im 27/39]
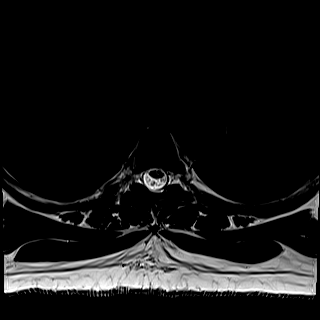
[im 33/39]
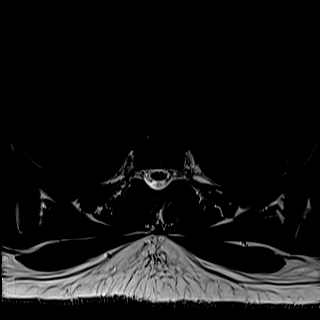
[im 39/39]
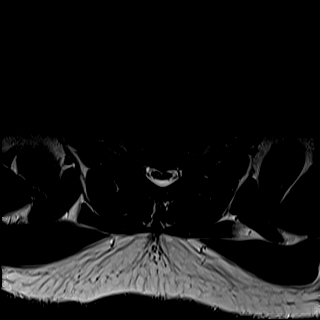

[27 of 48 positions shown; findings below may reference images not displayed]

FINDINGS: MRI THORACIC SPINE FINDINGS

Alignment: Physiologic with preservation of the normal thoracic
kyphosis.

Vertebrae: Vertebral body height maintained without acute or chronic
fracture. Bone marrow signal intensity somewhat heterogeneous and
diffusely decreased on T1 weighted imaging, nonspecific, but most
commonly related to anemia, smoking or obesity. No worrisome osseous
lesions. No abnormal marrow edema.

Cord: Normal signal and morphology. No epidural hematoma or other
collection.

Paraspinal and other soft tissues: Paraspinous soft tissues within
normal limits. Dependent atelectatic changes noted within the
visualized lungs. Complex cystic lesion noted at the anteromedial
aspect of the spleen, nonspecific, but possibly related to prior
trauma.

Disc levels:

C7-T1: Central disc protrusion indents the ventral thecal sac,
contacting the ventral cord (series 22, image 2). No significant
stenosis or cord deformity. Foramina remain patent.

T6-7: Right paracentral disc protrusion indents the right ventral
thecal sac, contacting and mildly flattening the ventral cord
(series 22, image 20). No cord signal changes or significant
stenosis. Foramina remain patent.

T7-8: Mild left eccentric disc bulge.  No significant stenosis.

No other significant disc pathology seen within the thoracic spine.
No significant spinal stenosis. Foramina remain patent.

MRI LUMBAR SPINE FINDINGS

Segmentation: Standard. Lowest well-formed disc space labeled the
L5-S1 level.

Alignment: Physiologic with preservation of the normal lumbar
lordosis. No listhesis.

Vertebrae: Vertebral body height maintained without acute or
interval fracture. Bone marrow signal intensity mildly heterogeneous
and diffusely decreased on T1 weighted imaging, nonspecific, but
most commonly related to anemia, smoking, or obesity. Few small
benign hemangiomata noted. No worrisome osseous lesions. No abnormal
marrow edema.

Conus medullaris and cauda equina: Conus extends to the T12-L1
level. Conus and cauda equina appear normal.

Paraspinal and other soft tissues: Paraspinous soft tissues within
normal limits. Visualized visceral structures are normal.

Disc levels:

L1-2:  Unremarkable.

L2-3: Minimal left eccentric disc bulge. No spinal stenosis.
Foramina remain patent.

L3-4: Mild disc bulge. Superimposed small biforaminal disc
protrusions closely approximate and/or contacts both of the exiting
L3 nerve roots as they course out of the neural foramina (series 4,
image 23), slightly greater on the right. Minimal facet hypertrophy.
No spinal stenosis. Foramina remain patent.

L4-5: Disc bulge with disc desiccation. Superimposed left foraminal
disc protrusion with annular fissure, closely approximating and/or
contacting the exiting left L4 nerve root (series 4, image 27). Mild
narrowing of the left lateral recess with mild left foraminal
stenosis. Right neural foramen remains patent as does the central
canal.

L5-S1:  Unremarkable.
IMPRESSION: MR THORACIC SPINE IMPRESSION:

1. Normal MRI appearance of the thoracic spinal cord. No significant
spinal stenosis or findings to explain patient's symptoms.
2. Small disc protrusions at C7-T1 and T6-7 without significant
stenosis.
3. Complex cystic lesion at the anterior aspect of the spleen,
partially visualized. Finding is indeterminate, but could be related
to remote trauma or other insult. Further evaluation with dedicated
cross-sectional imaging of the abdomen and pelvis suggested for
complete characterization.

MRI LUMBAR SPINE IMPRESSION:

1. Normal MRI appearance of the conus medullaris and cauda equina.
No high-grade spinal stenosis or findings to explain patient's
symptoms.
2. Left foraminal disc protrusion with annular fissure at L4-5,
potentially affecting the exiting left L4 nerve root.
3. Small biforaminal disc protrusions at L3-4. Either of the exiting
L3 nerve roots could be affected.

## 2020-10-30 MED ORDER — HYDROXYZINE HCL 50 MG/ML IM SOLN
50.0000 mg | Freq: Four times a day (QID) | INTRAMUSCULAR | Status: DC | PRN
  Filled 2020-10-30 (×2): qty 1

## 2020-10-30 MED ORDER — HYDROXYZINE HCL 25 MG PO TABS: 25.0000 mg | ORAL_TABLET | Freq: Three times a day (TID) | ORAL | Status: DC | PRN

## 2020-10-30 NOTE — Progress Notes (Signed)
PROGRESS NOTE    Leslie Meadows  VQM:[id number] DOB: [birth year] DOA: 10/29/2020 PCP: Margaretann Loveless, MD    Assessment & Plan:   Principal Problem:   Seizure [facility]) Active Problems:   Cervical myelopathy (HCC)   HLD (hyperlipidemia)   Hypothyroidism   OSA on CPAP   Chest pain   Possible seizure: CT head negative for acute intracranial abnormalities. MRI brain shows few small foci of T2 & flair signal w/in right frontal white matter & at gray-white junction. EEG pending. Continue w/ neuro checks. D/c tramadol. Neuro following and recs apprec  Hx of cervical stenosis & myelopathy: s/p C4-5 ADCF on 10/27/20 as per neuro surg. MRI T & L spine ordered as per neuro surg. Blood cxs ordered   Possibly dysphagia: failed bedside swallow. Speech consulted  Pre-DM: HbA1c 6.3. Would benefit from significant weight loss.    HLD: continue on statin    Hypothyroidism: continue on home dose of synthroid    OSA: CPAP qhs   Chest pain: etiology unclear, possibly MSK.  CT angiogram of chest is limited study, but is negative for central PE. Troponins are not elevated.   Splenic lesion: CTA showed  a left upper quadrant subdiaphragmatic low-attenuation complex cystic lesion with macroscopic fat and septal calcifications this appears to originate from the splenic upper pole. Measures 5 cm and has a indolent chronic appearance. Needs to f/u with PCP with dedicated abdomen CT without and with contrast in 3 months as an outpatient.  Morbid obesity: BMI 48.4. Complicates overall care & prognosis   DVT prophylaxis: SCDs Code Status: full  Family Communication: dicussed pt's care w/ pt's family at bedside and answered their questions. Multiple family members at bedside  Disposition Plan:  likely back home  Level of care: Progressive Cardiac  Status is: Observation  The patient remains OBS appropriate and will d/c before 2 midnights.  Dispo: The patient is from: Home              Anticipated d/c is  to: Home              Patient currently is not medically stable to d/c.   Difficult to place patient : unclear    Consultants:  Neuro Neuro surg   Procedures:   Antimicrobials:   Subjective: Pt c/o feeling jittery   Objective: Vitals:   10/30/20 0230 10/30/20 0430 10/30/20 0630 10/30/20 0730  BP: 116/77 112/74 124/90 (!) 143/93  Pulse: 81 75 73 94  Resp: 12 13 18 19   Temp: 97.7 F (36.5 C)     TempSrc: Oral     SpO2: 100% 100% 100% 100%  Weight:      Height:       No intake or output data in the 24 hours ending 10/30/20 0810 Filed Weights   10/29/20 1744  Weight: 120.2 kg    Examination:  General exam: Appears uncomfortable. Morbidly obese Respiratory system: diminished breath sounds b/l  Cardiovascular system: S1 & S2 +. No rubs or clicks  Gastrointestinal system: Abd is soft, NT, obese & hypoactive bowel sounds  Central nervous system: Alert and oriented. Moves all extremities  Psychiatry: Judgement and insight appear normal. Flat mood and affect     Data Reviewed: I have personally reviewed following labs and imaging studies  CBC: Recent Labs  Lab 10/28/20 0909 10/29/20 0632  WBC 11.0* 8.1  NEUTROABS  --  4.6  HGB 14.8 14.6  HCT 43.7 43.8  MCV 84.9 83.6  PLT 244  234   Basic Metabolic Panel: Recent Labs  Lab 10/28/20 0909 10/29/20 0632 10/30/20 0712  NA 137 138 135  K 4.6 3.8 3.7  CL 102 102 97*  CO2 27 27 26   GLUCOSE 122* 107* 104*  BUN 10 20 15   CREATININE 0.80 1.01* 0.81  CALCIUM 9.6 9.5 9.1  MG  --  2.2  --    GFR: Estimated Creatinine Clearance: 103.6 mL/min (by C-G formula based on SCr of 0.81 mg/dL). Liver Function Tests: Recent Labs  Lab 10/29/20 0632  AST 23  ALT 20  ALKPHOS 52  BILITOT 0.6  PROT 7.8  ALBUMIN 4.1   No results for input(s): LIPASE, AMYLASE in the last 168 hours. No results for input(s): AMMONIA in the last 168 hours. Coagulation Profile: No results for input(s): INR, PROTIME in the last 168  hours. Cardiac Enzymes: No results for input(s): CKTOTAL, CKMB, CKMBINDEX, TROPONINI in the last 168 hours. BNP (last 3 results) No results for input(s): PROBNP in the last 8760 hours. HbA1C: Recent Labs    10/29/20 0632  HGBA1C 6.3*   CBG: Recent Labs  Lab 10/27/20 2051 10/29/20 0802 10/30/20 0750  GLUCAP 192* 93 102*   Lipid Profile: Recent Labs    10/30/20 0712  CHOL 207*  HDL 44  LDLCALC 142*  TRIG 106  CHOLHDL 4.7   Thyroid Function Tests: No results for input(s): TSH, T4TOTAL, FREET4, T3FREE, THYROIDAB in the last 72 hours. Anemia Panel: No results for input(s): VITAMINB12, FOLATE, FERRITIN, TIBC, IRON, RETICCTPCT in the last 72 hours. Sepsis Labs: No results for input(s): PROCALCITON, LATICACIDVEN in the last 168 hours.  Recent Results (from the past 240 hour(s))  SARS CORONAVIRUS 2 (TAT 6-24 HRS) Nasopharyngeal Nasopharyngeal Swab     Status: None   Collection Time: 10/23/20 12:02 PM   Specimen: Nasopharyngeal Swab  Result Value Ref Range Status   SARS Coronavirus 2 NEGATIVE NEGATIVE Final    Comment: (NOTE) SARS-CoV-2 target nucleic acids are NOT DETECTED.  The SARS-CoV-2 RNA is generally detectable in upper and lower respiratory specimens during the acute phase of infection. Negative results do not preclude SARS-CoV-2 infection, do not rule out co-infections with other pathogens, and should not be used as the sole basis for treatment or other patient management decisions. Negative results must be combined with clinical observations, patient history, and epidemiological information. The expected result is Negative.  Fact Sheet for Patients: 11/01/20  Fact Sheet for Healthcare Providers: 10/25/20  This test is not yet approved or cleared by the [employer] FDA and  has been authorized for detection and/or diagnosis of SARS-CoV-2 by FDA under an Emergency Use Authorization (EUA).  This EUA will remain  in effect (meaning this test can be used) for the duration of the COVID-19 declaration under Se ction 564(b)(1) of the Act, 21 U.S.C. section 360bbb-3(b)(1), unless the authorization is terminated or revoked sooner.  Performed at [facility], [address], [place], [address] [place]   Resp Panel by RT-PCR (Flu A&B, Covid) Nasopharyngeal Swab     Status: None   Collection Time: 10/29/20  7:46 AM   Specimen: Nasopharyngeal Swab; Nasopharyngeal(NP) swabs in vial transport medium  Result Value Ref Range Status   SARS Coronavirus 2 by RT PCR NEGATIVE NEGATIVE Final    Comment: (NOTE) SARS-CoV-2 target nucleic acids are NOT DETECTED.  The SARS-CoV-2 RNA is generally detectable in upper respiratory specimens during the acute phase of infection. The lowest concentration of SARS-CoV-2 viral copies this assay  can detect is 138 copies/mL. A negative result does not preclude SARS-Cov-2 infection and should not be used as the sole basis for treatment or other patient management decisions. A negative result may occur with  improper specimen collection/handling, submission of specimen other than nasopharyngeal swab, presence of viral mutation(s) within the areas targeted by this assay, and inadequate number of viral copies(<138 copies/mL). A negative result must be combined with clinical observations, patient history, and epidemiological information. The expected result is Negative.  Fact Sheet for Patients:  BloggerCourse.com  Fact Sheet for Healthcare Providers:  SeriousBroker.it  This test is no t yet approved or cleared by the [place] FDA and  has been authorized for detection and/or diagnosis of SARS-CoV-2 by FDA under an Emergency Use Authorization (EUA). This EUA will remain  in effect (meaning this test can be used) for the duration of the COVID-19 declaration under Section 564(b)(1) of the Act,  21 U.S.C.section 360bbb-3(b)(1), unless the authorization is terminated  or revoked sooner.       Influenza A by PCR NEGATIVE NEGATIVE Final   Influenza B by PCR NEGATIVE NEGATIVE Final    Comment: (NOTE) The Xpert Xpress SARS-CoV-2/FLU/RSV plus assay is intended as an aid in the diagnosis of influenza from Nasopharyngeal swab specimens and should not be used as a sole basis for treatment. Nasal washings and aspirates are unacceptable for Xpert Xpress SARS-CoV-2/FLU/RSV testing.  Fact Sheet for Patients: BloggerCourse.com  Fact Sheet for Healthcare Providers: SeriousBroker.it  This test is not yet approved or cleared by the [place] FDA and has been authorized for detection and/or diagnosis of SARS-CoV-2 by FDA under an Emergency Use Authorization (EUA). This EUA will remain in effect (meaning this test can be used) for the duration of the COVID-19 declaration under Section 564(b)(1) of the Act, 21 U.S.C. section 360bbb-3(b)(1), unless the authorization is terminated or revoked.  Performed at [facility], [address] [place], [place] [address]          Radiology Studies: CT HEAD WO CONTRAST  Result Date: 10/29/2020 CLINICAL DATA:  Seizure. EXAM: CT HEAD WITHOUT CONTRAST TECHNIQUE: Contiguous axial images were obtained from the base of the skull through the vertex without intravenous contrast. COMPARISON:  None. FINDINGS: Brain: No evidence of acute infarction, hemorrhage, hydrocephalus, extra-axial collection or mass lesion/mass effect. Vascular: No hyperdense vessel or unexpected calcification. Skull: Normal. Negative for fracture or focal lesion. Sinuses/Orbits: Retention cyst versus polyp noted in the right maxillary sinus. Other: None. IMPRESSION: 1. No acute intracranial abnormalities. Normal brain. 2. Retention cyst versus polyp in the right maxillary sinus. Electronically Signed   By: Signa Kell  M.D.   On: 10/29/2020 07:38   CT Angio Chest Pulmonary Embolism (PE) W or WO Contrast  Result Date: 10/29/2020 CLINICAL DATA:  Acute chest pain shortness of breath, recent cervical spine fusion 2 days ago EXAM: CT ANGIOGRAPHY CHEST WITH CONTRAST TECHNIQUE: Multidetector CT imaging of the chest was performed using the standard protocol during bolus administration of intravenous contrast. Multiplanar CT image reconstructions and MIPs were obtained to evaluate the vascular anatomy. CONTRAST:  14mL OMNIPAQUE IOHEXOL 350 MG/ML SOLN COMPARISON:  None. FINDINGS: Cardiovascular: Central and proximal hilar pulmonary vasculature appear patent without significant acute filling defect or pulmonary embolus. Limited assessment of the lower lobe peripheral segmental branches because of low lung volumes and bibasilar collapse/consolidation. Difficult to exclude small peripheral PE. Normal heart size. Intact thoracic aorta. Negative for aneurysm or dissection. No mediastinal hemorrhage or hematoma. Central venous structures  appear patent. No veno-occlusive process. Mediastinum/Nodes: No enlarged mediastinal, hilar, or axillary lymph nodes. Thyroid gland, trachea, and esophagus demonstrate no significant findings. Lungs/Pleura: Low lung volumes with bibasilar atelectasis/consolidation. Difficult to exclude basilar pneumonia. Lingula and right middle lobe atelectasis also noted. No large effusion, pleural abnormality or pneumothorax. Trachea and central airways appear patent. Upper Abdomen: Diffuse hepatic steatosis noted of the imaged portion of the liver. No biliary obstruction pattern. Left upper quadrant subdiaphragmatic low-attenuation complex cystic lesion with macroscopic fat and septal calcifications this appears to originate from the splenic upper pole. This measures 5 cm and has a indolent chronic appearance. Recommend follow-up dedicated abdomen CT without and with contrast in 3 months as an outpatient. Musculoskeletal:  No acute osseous finding. Minor endplate degenerative changes. No compression fracture. Sternum intact. Review of the MIP images confirms the above findings. IMPRESSION: Negative for significant acute central or proximal hilar pulmonary embolus. Limited assessment of the peripheral segmental branches for small PE evaluation. Dense bibasilar collapse/consolidation and additional areas of right middle lobe and lingula atelectasis. Hepatic steatosis Indeterminate left upper quadrant 5 cm complex septated cystic lesion with macroscopic fat and central calcifications measuring up to 5 cm. See above comment and recommendation. Electronically Signed   By: Judie Petit.  Shick M.D.   On: 10/29/2020 11:49   CT Cervical Spine Wo Contrast  Result Date: 10/29/2020 CLINICAL DATA:  Seizure.  Unresponsive. EXAM: CT CERVICAL SPINE WITHOUT CONTRAST TECHNIQUE: Multidetector CT imaging of the cervical spine was performed without intravenous contrast. Multiplanar CT image reconstructions were also generated. COMPARISON:  None. FINDINGS: Alignment: Normal Skull base and vertebrae: No acute fracture. No primary bone lesion or focal pathologic process. Soft tissues and spinal canal: There is no signs of canal hematoma. Diffuse prevertebral soft tissue edema is identified with a few foci of gas within the retropharyngeal and prevertebral soft tissues compatible with recent ACDF. Disc levels: Status post ACDF at C4-5. stable position of the anterior sideplate and screw device with interbody plug. Degenerative disc disease identified at C5-6 and C6-7. Upper chest: Negative. Other: None. IMPRESSION: 1. No evidence for cervical spine fracture. 2. Status post ACDF at C4-5. 3. Diffuse prevertebral soft tissue edema with a few foci of gas within the retropharyngeal and prevertebral soft tissues compatible with recent ACDF. Electronically Signed   By: Signa Kell M.D.   On: 10/29/2020 09:23   MR BRAIN W WO CONTRAST  Result Date:  10/29/2020 CLINICAL DATA:  Seizure. Abnormal neurological exam. Postictal state. EXAM: MRI HEAD WITHOUT AND WITH CONTRAST TECHNIQUE: Multiplanar, multiecho pulse sequences of the brain and surrounding structures were obtained without and with intravenous contrast. CONTRAST:  94mL GADAVIST GADOBUTROL 1 MMOL/ML IV SOLN COMPARISON:  Head CT same day. FINDINGS: Brain: Diffusion imaging does not show any acute or subacute infarction. No abnormality affects the brainstem or cerebellum. The left cerebral hemispheres normal. In the right frontal lobe, there are a few small foci of T2 and FLAIR signal within the white matter and at the right gray-white junction. Mesial temporal lobes appear normal on both sides. No evidence of mass, hemorrhage, hydrocephalus or extra-axial collection. Vascular: Major vessels at the base of the brain show flow. Skull and upper cervical spine: Negative Sinuses/Orbits: Retention cyst in the right maxillary sinus. Sinuses otherwise clear. Orbits negative. Other: None IMPRESSION: No acute brain finding. Few small foci of T2 and FLAIR signal within the right frontal white matter and at the gray-white junction. These are often seen in asymptomatic individuals, but could conceivably relate  to seizure Electronically Signed   By: Paulina Fusi M.D.   On: 10/29/2020 17:25   EEG adult  Result Date: 10/29/2020 Jefferson Fuel, MD     10/29/2020  2:23 PM Routine EEG Report KALYN HOFSTRA is a 51 y.o. female with a history of  who is undergoing an EEG to evaluate for seizures. Report: This EEG was acquired with electrodes placed according to the International 10-20 electrode system (including Fp1, Fp2, F3, F4, C3, C4, P3, P4, O1, O2, T3, T4, T5, T6, A1, A2, Fz, Cz, Pz). The following electrodes were missing or displaced: none. The occipital dominant rhythm was 9 Hz. This activity is reactive to stimulation. Drowsiness was manifested by background fragmentation; deeper stages of sleep were identified by K  complexes and sleep spindles. There was no focal slowing. There were no interictal epileptiform discharges. There were no electrographic seizures identified. There was no abnormal response to photic stimulation or hyperventilation. Impression: This EEG was obtained while awake and asleep and is normal.   Clinical Correlation: Normal EEGs, however, do not rule out epilepsy. Bing Neighbors, MD [facility] [phone] If 7pm- 7am, please page neurology on call as listed in AMION.       Scheduled Meds:  aspirin EC  81 mg Oral Daily   cholecalciferol  1,000 Units Oral Daily   levothyroxine  12.5 mcg Oral QAC breakfast   methocarbamol  500 mg Oral Q6H   rosuvastatin  40 mg Oral QHS   senna-docusate  1 tablet Oral BID   Continuous Infusions:   LOS: 0 days    Time spent: 35 mins     Charise Killian, MD [facility] Pager [phone]  If 7PM-7AM, please contact night-coverage 10/30/2020, 8:10 AM

## 2020-10-30 NOTE — Evaluation (Signed)
Clinical/Bedside Swallow Evaluation Patient Details  Name: Leslie Meadows MRN: [id number] Date of Birth: [birth year]  Today's Date: 10/30/2020 Time: SLP Start Time (ACUTE ONLY): 1420 SLP Stop Time (ACUTE ONLY): 1450 SLP Time Calculation (min) (ACUTE ONLY): 30 min  Past Medical History:  Past Medical History:  Diagnosis Date   HLD (hyperlipidemia)    Sleep apnea    Thyroid disease    Past Surgical History:  Past Surgical History:  Procedure Laterality Date   ABDOMINAL HYSTERECTOMY     ANTERIOR CERVICAL DECOMP/DISCECTOMY FUSION N/A 10/27/2020   Procedure: C4-5 ANTERIOR CERVICAL DECOMPRESSION/DISCECTOMY FUSION 1 LEVEL;  Surgeon: Lucy Chris, MD;  Location: [facility] ORS;  Service: Neurosurgery;  Laterality: N/A;   TUBAL LIGATION     HPI:  Per admitting H &P " Leslie Meadows is a 50 y.o. female with medical history significant of hyperlipidemia, hypothyroidism, anxiety, OSA on CPAP, obesity, cervical myelopathy (s/p of C4-5 ACDF), who present with seizure.     Pt was recently hospitalized from 9/19-9/20 due to cervical myelopathy. Pt underwent C4-5 ACDF by Dr. Adriana Simas of neurosurgery. Pt is taking Robaxin and Ultram after surgery. Per her daughter, pt was noted to have seizure like activity in the early morning at about 3:00 AM, described as "fluttering eyes and jerking movements". The episode lasted for about 5 minutes.  At that time.  Patient was not talking or answering questions. She reportedly had a similar episode previously while she was in the hospital involving some shaking behaviors and altered mental status with inability to speak. When I saw pt in ED, she is confused, slightly nodding her head when asked for question, slightly moves her extremities.  No facial droop.  Initially patient did not have chest pain, coughing or shortness breath, but later on pt seems to have developed some chest pain on the right side and mild shortness of breath.  No cough.  Temperature normal.  Patient does not have  active nausea, vomiting or abdominal pain.  Patient is constipated per her daughter.  Pt has some pain in the surgical site of her neck per her daughter."    Assessment / Plan / Recommendation  Clinical Impression  Bedside swallow eval revealed no s/s of aspiration with any tested consistency. Pt did present with a slow and deliberate swallow with thin liquuids and was noted to have multiple swallows per bolus.Pt tolerated applesauce and graham cracker, noted slow oral tranist woith the cracker. Alternating liquids with solids helped clear and oral residue. Rec Dys 3 diet with chopped meats. Educated Pt and her family on need to eat slow and alternate liquids with solids. Pt failed swallow screen exhibiting dysphagia with thin liquids therfore, ST to follow up with toleration of diet. SLP Visit Diagnosis: Dysphagia, unspecified (R13.10)    Aspiration Risk  Mild aspiration risk    Diet Recommendation Dysphagia 3 (Mech soft)   Liquid Administration via: Straw;Cup Medication Administration: Whole meds with puree Supervision: Patient able to self feed Compensations: Slow rate;Small sips/bites Postural Changes: Remain upright for at least 30 minutes after po intake;Seated upright at 90 degrees    Other  Recommendations      Recommendations for follow up therapy are one component of a multi-disciplinary discharge planning process, led by the attending physician.  Recommendations may be updated based on patient status, additional functional criteria and insurance authorization.  Follow up Recommendations    ST to f/u x1    Frequency and Duration  Prognosis Prognosis for Safe Diet Advancement: Good      Swallow Study   General Date of Onset: 10/29/20 HPI: Per admitting H &P " Leslie Meadows is a 50 y.o. female with medical history significant of hyperlipidemia, hypothyroidism, anxiety, OSA on CPAP, obesity, cervical myelopathy (s/p of C4-5 ACDF), who present with seizure.     Pt  was recently hospitalized from 9/19-9/20 due to cervical myelopathy. Pt underwent C4-5 ACDF by Dr. Adriana Simas of neurosurgery. Pt is taking Robaxin and Ultram after surgery. Per her daughter, pt was noted to have seizure like activity in the early morning at about 3:00 AM, described as "fluttering eyes and jerking movements". The episode lasted for about 5 minutes.  At that time.  Patient was not talking or answering questions. She reportedly had a similar episode previously while she was in the hospital involving some shaking behaviors and altered mental status with inability to speak. When I saw pt in ED, she is confused, slightly nodding her head when asked for question, slightly moves her extremities.  No facial droop.  Initially patient did not have chest pain, coughing or shortness breath, but later on pt seems to have developed some chest pain on the right side and mild shortness of breath.  No cough.  Temperature normal.  Patient does not have active nausea, vomiting or abdominal pain.  Patient is constipated per her daughter.  Pt has some pain in the surgical site of her neck per her daughter." Type of Study: Bedside Swallow Evaluation Diet Prior to this Study: NPO Respiratory Status: Room air;Other (comment) (she reports she wears bipap at night) History of Recent Intubation: No Behavior/Cognition: Alert;Cooperative Oral Cavity Assessment: Within Functional Limits Oral Care Completed by SLP: No Oral Cavity - Dentition: Adequate natural dentition (reports she has partials) Vision: Functional for self-feeding Self-Feeding Abilities: Able to feed self Patient Positioning: Upright in bed Baseline Vocal Quality: Low vocal intensity    Oral/Motor/Sensory Function Overall Oral Motor/Sensory Function: Within functional limits   Ice Chips Ice chips: Within functional limits Presentation: Spoon   Thin Liquid Thin Liquid: Impaired Presentation: Cup;Spoon;Straw Oral Phase Functional Implications: Oral  holding Pharyngeal  Phase Impairments: Multiple swallows    Nectar Thick Nectar Thick Liquid: Not tested   Honey Thick Honey Thick Liquid: Not tested   Puree Puree: Within functional limits Presentation: Spoon   Solid     Solid: Impaired Presentation: Self Fed Oral Phase Functional Implications: Prolonged oral transit      Eather Colas 10/30/2020,2:58 PM

## 2020-10-30 NOTE — ED Notes (Signed)
Pt sitting up in bed eating dinner. Pt medicated with morphine for 10/10 pain. And miralax for constipation. Pt also provided prune juice to assist in bowel movement. Family at bedside., call bell in reach. No other needs at this time.

## 2020-10-30 NOTE — ED Notes (Signed)
Pt given ginger ale and broth warmed up per request

## 2020-10-30 NOTE — ED Notes (Signed)
Pt states pain is a little better after the robaxin. States 8/10 at this time. Lights turned off, pt repositioned in bed, family at bedside. Call bell in reach. No other needs at this time.

## 2020-10-30 NOTE — ED Notes (Signed)
Attending provider and CN at bedside with 4 family members and pt. Pt daughter still accusing other nurse of not performing swallow screen. Daughter appears agitated. Attending provider attempting to listen to and validate concerns. Daughter of pt still raising voice.

## 2020-10-30 NOTE — ED Notes (Signed)
This RN called into room by this pt's daughter who came into the hall stating her mother was having a seizure. Pt laying in bed, VSS, able to open eyes to command, able to move hands and extremities on command. Daughter states these are the seizures the pt has been having, pt able to follow other people's commands but not daughters, and pt stops speaking and closes her eyes. MD Mayford Knife notified of event. VSS, pt reports they want something for anxiety.

## 2020-10-30 NOTE — ED Notes (Signed)
Patient transported to MRI 

## 2020-10-30 NOTE — ED Notes (Signed)
Pt has 4 family members at bedside. Family in room talking loudly and accusing RN of not caring for pt and not performing swallow screen. The RN assigned to this pt did perform swallow screen and pt failed. Family in hallway stating that RN did not perform screen because they had "only stepped out for a few minutes to smoke a cigarette" and therefore it was not done. Family wants pt to eat. Family now impersonating the RN Diannia Ruder) assigned to this pt, again loudly. Charge nurse informed of family's disruptive behavior. Diannia Ruder, RN currently caring for other critical pt and has also been caring for this pt.

## 2020-10-31 ENCOUNTER — Inpatient Hospital Stay: Payer: Worker's Compensation

## 2020-10-31 ENCOUNTER — Other Ambulatory Visit: Payer: Self-pay

## 2020-10-31 ENCOUNTER — Encounter: Payer: Self-pay | Admitting: Internal Medicine

## 2020-10-31 DIAGNOSIS — K760 Fatty (change of) liver, not elsewhere classified: Secondary | ICD-10-CM | POA: Diagnosis present

## 2020-10-31 DIAGNOSIS — Z7982 Long term (current) use of aspirin: Secondary | ICD-10-CM | POA: Diagnosis not present

## 2020-10-31 DIAGNOSIS — Z803 Family history of malignant neoplasm of breast: Secondary | ICD-10-CM | POA: Diagnosis not present

## 2020-10-31 DIAGNOSIS — M542 Cervicalgia: Secondary | ICD-10-CM | POA: Diagnosis present

## 2020-10-31 DIAGNOSIS — R7303 Prediabetes: Secondary | ICD-10-CM | POA: Diagnosis present

## 2020-10-31 DIAGNOSIS — E785 Hyperlipidemia, unspecified: Secondary | ICD-10-CM | POA: Diagnosis present

## 2020-10-31 DIAGNOSIS — K5900 Constipation, unspecified: Secondary | ICD-10-CM | POA: Diagnosis present

## 2020-10-31 DIAGNOSIS — R569 Unspecified convulsions: Secondary | ICD-10-CM | POA: Diagnosis not present

## 2020-10-31 DIAGNOSIS — Z79899 Other long term (current) drug therapy: Secondary | ICD-10-CM | POA: Diagnosis not present

## 2020-10-31 DIAGNOSIS — E039 Hypothyroidism, unspecified: Secondary | ICD-10-CM | POA: Diagnosis present

## 2020-10-31 DIAGNOSIS — R519 Headache, unspecified: Secondary | ICD-10-CM | POA: Diagnosis not present

## 2020-10-31 DIAGNOSIS — G959 Disease of spinal cord, unspecified: Secondary | ICD-10-CM | POA: Diagnosis not present

## 2020-10-31 DIAGNOSIS — D7389 Other diseases of spleen: Secondary | ICD-10-CM | POA: Diagnosis present

## 2020-10-31 DIAGNOSIS — Z79890 Hormone replacement therapy (postmenopausal): Secondary | ICD-10-CM | POA: Diagnosis not present

## 2020-10-31 DIAGNOSIS — D696 Thrombocytopenia, unspecified: Secondary | ICD-10-CM | POA: Diagnosis present

## 2020-10-31 DIAGNOSIS — Z6841 Body Mass Index (BMI) 40.0 and over, adult: Secondary | ICD-10-CM | POA: Diagnosis not present

## 2020-10-31 DIAGNOSIS — Z20822 Contact with and (suspected) exposure to covid-19: Secondary | ICD-10-CM | POA: Diagnosis present

## 2020-10-31 DIAGNOSIS — R531 Weakness: Secondary | ICD-10-CM | POA: Diagnosis present

## 2020-10-31 DIAGNOSIS — G4733 Obstructive sleep apnea (adult) (pediatric): Secondary | ICD-10-CM | POA: Diagnosis present

## 2020-10-31 DIAGNOSIS — R1310 Dysphagia, unspecified: Secondary | ICD-10-CM | POA: Diagnosis present

## 2020-10-31 HISTORY — DX: Cervicalgia: M54.2

## 2020-10-31 LAB — CBC
HCT: 43.2 % (ref 36.0–46.0)
Hemoglobin: 13.9 g/dL (ref 12.0–15.0)
MCH: 28.7 pg (ref 26.0–34.0)
MCHC: 32.2 g/dL (ref 30.0–36.0)
MCV: 89.1 fL (ref 80.0–100.0)
Platelets: 141 10*3/uL — ABNORMAL LOW (ref 150–400)
RBC: 4.85 MIL/uL (ref 3.87–5.11)
RDW: 14.1 % (ref 11.5–15.5)
WBC: 4.9 10*3/uL (ref 4.0–10.5)
nRBC: 0 % (ref 0.0–0.2)

## 2020-10-31 LAB — BASIC METABOLIC PANEL
Anion gap: 13 (ref 5–15)
BUN: 14 mg/dL (ref 6–20)
CO2: 23 mmol/L (ref 22–32)
Calcium: 9 mg/dL (ref 8.9–10.3)
Chloride: 100 mmol/L (ref 98–111)
Creatinine, Ser: 0.61 mg/dL (ref 0.44–1.00)
GFR, Estimated: >60 mL/min (ref >60)
Glucose, Bld: 103 mg/dL — ABNORMAL HIGH (ref 70–99)
Potassium: 4.4 mmol/L (ref 3.5–5.1)
Sodium: 136 mmol/L (ref 135–145)

## 2020-10-31 LAB — GLUCOSE, CAPILLARY: Glucose-Capillary: 93 mg/dL (ref 70–99)

## 2020-10-31 NOTE — Progress Notes (Signed)
PT Cancellation Note  Patient Details Name: VICCI REDER MRN: [id number] DOB: 04-26-1970   Cancelled Treatment:    Reason Eval/Treat Not Completed: Medical issues which prohibited therapy;Patient not medically ready (Per chart review, neurosurgical team ordering MRI lumbar and thoracic spines. At present, no updates from neurosurgical since imaging completed.) Will wait for pertinent updates from consulting team and proceed once appropriate.   9:32 AM, 10/31/20 Rosamaria Lints, PT, DPT Physical Therapist - [facility]  [phone] (ASCOM)    Marcellina Jonsson C 10/31/2020, 9:31 AM

## 2020-10-31 NOTE — Progress Notes (Signed)
Pt visited after lunch today. Pt reports she tolerated the diet well except some of the dry meats "got stuck" Pt reports she likes the food consistency. Will add extra gravies to meals. Speech was clear and appropriate today. Will f/u 1-2 days to ensure toleration of diet. With current diet, Pt is able to choose foods that work for her given recent surgery.

## 2020-10-31 NOTE — Progress Notes (Signed)
PROGRESS NOTE    Leslie Meadows  AJO:[id number] DOB: 27-Jul-1970 DOA: 10/29/2020 PCP: Margaretann Loveless, MD    Assessment & Plan:   Principal Problem:   Seizure [facility]) Active Problems:   Cervical myelopathy (HCC)   HLD (hyperlipidemia)   Hypothyroidism   OSA on CPAP   Chest pain   Unlikely seizure: CT head negative for acute intracranial abnormalities. MRI brain shows few small foci of T2 & flair signal w/in right frontal white matter & at gray-white junction. EEG shows no seizure activity. Continue w/ neuro checks. PT/OT consulted. No seizures meds indicated as per neuro. D/c tramadol. Neuro following and recs apprec  Hx of cervical stenosis & myelopathy: s/p C4-5 ADCF on 10/27/20 as per neuro surg. MRI T&L spine results as per neuro surg. PT/OT consulted   Possibly dysphagia: failed bedside swallow. Continue on dysphagia III diet as per speech   Pre-DM: HbA1c 6.3. Would benefit from significant weight loss  Thrombocytopenia: etiology unclear. Will continue to monitor    HLD: continue on statin    Hypothyroidism: continue on home dose of synthroid   OSA: CPAP qhs   Chest pain: etiology unclear, possibly MSK.  CT angiogram of chest is limited study, but is negative for central PE. Troponins are not elevated. Resolved   Splenic lesion: CTA showed  a left upper quadrant subdiaphragmatic low-attenuation complex cystic lesion with macroscopic fat and septal calcifications this appears to originate from the splenic upper pole. Measures 5 cm and has a indolent chronic appearance. Needs to f/u with PCP with dedicated abdomen CT without and with contrast in 3 months as an outpatient.  Morbid obesity: BMI 48.4. Complicates overall care & prognosis    DVT prophylaxis: SCDs Code Status: full  Family Communication: dicussed pt's care w/ pt's family at bedside and answered their questions.  Disposition Plan:  likely back home  Level of care: Progressive Cardiac  Status is:  Observation  The patient remains OBS appropriate and will d/c before 2 midnights.  Dispo: The patient is from: Home              Anticipated d/c is to: Home              Patient currently is not medically stable to d/c.   Difficult to place patient : unclear    Consultants:  Neuro Neuro surg   Procedures:   Antimicrobials:   Subjective: Pt c/o headache  Objective: Vitals:   10/30/20 2200 10/30/20 2248 10/31/20 0116 10/31/20 0554  BP: 125/82  122/85 (!) 123/95  Pulse: 99  94   Resp: 17  18 16   Temp:  98.2 F (36.8 C) 97.7 F (36.5 C) 98.2 F (36.8 C)  TempSrc:  Oral  Axillary  SpO2: 97%  100% 98%  Weight:      Height:        Intake/Output Summary (Last 24 hours) at 10/31/2020 0727 Last data filed at 10/31/2020 0100 Gross per 24 hour  Intake 240 ml  Output 1000 ml  Net -760 ml   Filed Weights   10/29/20 1744  Weight: 120.2 kg    Examination:  General exam: Appears comfortable  Respiratory system: decreased breath sounds b/l otherwise clear Cardiovascular system: S1/S2+. No rubs or gallops Gastrointestinal system: Abd is soft, NT, obese & hypoactive bowel sounds Central nervous system: Alert and oriented. Moves all extremities  Psychiatry: Judgement and insight appear normal. Flat mood and affect    Data Reviewed: I have personally reviewed following  labs and imaging studies  CBC: Recent Labs  Lab 10/28/20 0909 10/29/20 0632 10/31/20 0515  WBC 11.0* 8.1 4.9  NEUTROABS  --  4.6  --   HGB 14.8 14.6 13.9  HCT 43.7 43.8 43.2  MCV 84.9 83.6 89.1  PLT 244 234 141*   Basic Metabolic Panel: Recent Labs  Lab 10/28/20 0909 10/29/20 0632 10/30/20 0712 10/31/20 0515  NA 137 138 135 136  K 4.6 3.8 3.7 4.4  CL 102 102 97* 100  CO2 27 27 26 23   GLUCOSE 122* 107* 104* 103*  BUN 10 20 15 14   CREATININE 0.80 1.01* 0.81 0.61  CALCIUM 9.6 9.5 9.1 9.0  MG  --  2.2  --   --    GFR: Estimated Creatinine Clearance: 104.9 mL/min (by C-G formula based on  SCr of 0.61 mg/dL). Liver Function Tests: Recent Labs  Lab 10/29/20 0632  AST 23  ALT 20  ALKPHOS 52  BILITOT 0.6  PROT 7.8  ALBUMIN 4.1   No results for input(s): LIPASE, AMYLASE in the last 168 hours. No results for input(s): AMMONIA in the last 168 hours. Coagulation Profile: No results for input(s): INR, PROTIME in the last 168 hours. Cardiac Enzymes: No results for input(s): CKTOTAL, CKMB, CKMBINDEX, TROPONINI in the last 168 hours. BNP (last 3 results) No results for input(s): PROBNP in the last 8760 hours. HbA1C: Recent Labs    10/29/20 0632  HGBA1C 6.3*   CBG: Recent Labs  Lab 10/27/20 2051 10/29/20 0802 10/30/20 0750  GLUCAP 192* 93 102*   Lipid Profile: Recent Labs    10/30/20 0712  CHOL 207*  HDL 44  LDLCALC 142*  TRIG 106  CHOLHDL 4.7   Thyroid Function Tests: No results for input(s): TSH, T4TOTAL, FREET4, T3FREE, THYROIDAB in the last 72 hours. Anemia Panel: No results for input(s): VITAMINB12, FOLATE, FERRITIN, TIBC, IRON, RETICCTPCT in the last 72 hours. Sepsis Labs: No results for input(s): PROCALCITON, LATICACIDVEN in the last 168 hours.  Recent Results (from the past 240 hour(s))  SARS CORONAVIRUS 2 (TAT 6-24 HRS) Nasopharyngeal Nasopharyngeal Swab     Status: None   Collection Time: 10/23/20 12:02 PM   Specimen: Nasopharyngeal Swab  Result Value Ref Range Status   SARS Coronavirus 2 NEGATIVE NEGATIVE Final    Comment: (NOTE) SARS-CoV-2 target nucleic acids are NOT DETECTED.  The SARS-CoV-2 RNA is generally detectable in upper and lower respiratory specimens during the acute phase of infection. Negative results do not preclude SARS-CoV-2 infection, do not rule out co-infections with other pathogens, and should not be used as the sole basis for treatment or other patient management decisions. Negative results must be combined with clinical observations, patient history, and epidemiological information. The expected result is  Negative.  Fact Sheet for Patients: 11/01/20  Fact Sheet for Healthcare Providers: 10/25/20  This test is not yet approved or cleared by the [employer] FDA and  has been authorized for detection and/or diagnosis of SARS-CoV-2 by FDA under an Emergency Use Authorization (EUA). This EUA will remain  in effect (meaning this test can be used) for the duration of the COVID-19 declaration under Se ction 564(b)(1) of the Act, 21 U.S.C. section 360bbb-3(b)(1), unless the authorization is terminated or revoked sooner.  Performed at [facility], [address], [place], [address] [place]   Resp Panel by RT-PCR (Flu A&B, Covid) Nasopharyngeal Swab     Status: None   Collection Time: 10/29/20  7:46 AM   Specimen: Nasopharyngeal  Swab; Nasopharyngeal(NP) swabs in vial transport medium  Result Value Ref Range Status   SARS Coronavirus 2 by RT PCR NEGATIVE NEGATIVE Final    Comment: (NOTE) SARS-CoV-2 target nucleic acids are NOT DETECTED.  The SARS-CoV-2 RNA is generally detectable in upper respiratory specimens during the acute phase of infection. The lowest concentration of SARS-CoV-2 viral copies this assay can detect is 138 copies/mL. A negative result does not preclude SARS-Cov-2 infection and should not be used as the sole basis for treatment or other patient management decisions. A negative result may occur with  improper specimen collection/handling, submission of specimen other than nasopharyngeal swab, presence of viral mutation(s) within the areas targeted by this assay, and inadequate number of viral copies(<138 copies/mL). A negative result must be combined with clinical observations, patient history, and epidemiological information. The expected result is Negative.  Fact Sheet for Patients:  BloggerCourse.com  Fact Sheet for Healthcare Providers:   [employer]ious[employer]ker[employer]it  This test is no t yet approved or cleared by the [place] FDA and  has been authorized for detection and/or diagnosis of SARS-CoV-2 by FDA under an Emergency Use Authorization (EUA). This EUA will remain  in effect (meaning this test can be used) for the duration of the COVID-19 declaration under Section 564(b)(1) of the Act, 21 U.S.C.section 360bbb-3(b)(1), unless the authorization is terminated  or revoked sooner.       Influenza A by PCR NEGATIVE NEGATIVE Final   Influenza B by PCR NEGATIVE NEGATIVE Final    Comment: (NOTE) The Xpert Xpress SARS-CoV-2/FLU/RSV plus assay is intended as an aid in the diagnosis of influenza from Nasopharyngeal swab specimens and should not be used as a sole basis for treatment. Nasal washings and aspirates are unacceptable for Xpert Xpress SARS-CoV-2/FLU/RSV testing.  Fact Sheet for Patients: BloggerCourse.com  Fact Sheet for Healthcare Providers: SeriousBroker.it  This test is not yet approved or cleared by the [place] FDA and has been authorized for detection and/or diagnosis of SARS-CoV-2 by FDA under an Emergency Use Authorization (EUA). This EUA will remain in effect (meaning this test can be used) for the duration of the COVID-19 declaration under Section 564(b)(1) of the Act, 21 U.S.C. section 360bbb-3(b)(1), unless the authorization is terminated or revoked.  Performed at [facility], [address] [place], [place] [address]   CULTURE, BLOOD (ROUTINE X 2) w Reflex to ID Panel     Status: None (Preliminary result)   Collection Time: 10/30/20  3:17 PM   Specimen: BLOOD  Result Value Ref Range Status   Specimen Description BLOOD BLOOD RIGHT HAND  Final   Special Requests   Final    BOTTLES DRAWN AEROBIC AND ANAEROBIC Blood Culture results may not be optimal due to an inadequate volume of blood received in culture  bottles   Culture   Final    NO GROWTH < 24 HOURS Performed at [facility], [address], [place], [place] [address]    Report Status PENDING  Incomplete  CULTURE, BLOOD (ROUTINE X 2) w Reflex to ID Panel     Status: None (Preliminary result)   Collection Time: 10/30/20  3:17 PM   Specimen: BLOOD  Result Value Ref Range Status   Specimen Description BLOOD BLOOD LEFT HAND  Final   Special Requests   Final    BOTTLES DRAWN AEROBIC AND ANAEROBIC Blood Culture results may not be optimal due to an inadequate volume of blood received in culture bottles   Culture   Final  NO GROWTH < 24 HOURS Performed at [facility], [address] [place], [place] [address]    Report Status PENDING  Incomplete         Radiology Studies: CT Angio Chest Pulmonary Embolism (PE) W or WO Contrast  Result Date: 10/29/2020 CLINICAL DATA:  Acute chest pain shortness of breath, recent cervical spine fusion 2 days ago EXAM: CT ANGIOGRAPHY CHEST WITH CONTRAST TECHNIQUE: Multidetector CT imaging of the chest was performed using the standard protocol during bolus administration of intravenous contrast. Multiplanar CT image reconstructions and MIPs were obtained to evaluate the vascular anatomy. CONTRAST:  71mL OMNIPAQUE IOHEXOL 350 MG/ML SOLN COMPARISON:  None. FINDINGS: Cardiovascular: Central and proximal hilar pulmonary vasculature appear patent without significant acute filling defect or pulmonary embolus. Limited assessment of the lower lobe peripheral segmental branches because of low lung volumes and bibasilar collapse/consolidation. Difficult to exclude small peripheral PE. Normal heart size. Intact thoracic aorta. Negative for aneurysm or dissection. No mediastinal hemorrhage or hematoma. Central venous structures appear patent. No veno-occlusive process. Mediastinum/Nodes: No enlarged mediastinal, hilar, or axillary lymph nodes. Thyroid gland, trachea, and esophagus demonstrate no  significant findings. Lungs/Pleura: Low lung volumes with bibasilar atelectasis/consolidation. Difficult to exclude basilar pneumonia. Lingula and right middle lobe atelectasis also noted. No large effusion, pleural abnormality or pneumothorax. Trachea and central airways appear patent. Upper Abdomen: Diffuse hepatic steatosis noted of the imaged portion of the liver. No biliary obstruction pattern. Left upper quadrant subdiaphragmatic low-attenuation complex cystic lesion with macroscopic fat and septal calcifications this appears to originate from the splenic upper pole. This measures 5 cm and has a indolent chronic appearance. Recommend follow-up dedicated abdomen CT without and with contrast in 3 months as an outpatient. Musculoskeletal: No acute osseous finding. Minor endplate degenerative changes. No compression fracture. Sternum intact. Review of the MIP images confirms the above findings. IMPRESSION: Negative for significant acute central or proximal hilar pulmonary embolus. Limited assessment of the peripheral segmental branches for small PE evaluation. Dense bibasilar collapse/consolidation and additional areas of right middle lobe and lingula atelectasis. Hepatic steatosis Indeterminate left upper quadrant 5 cm complex septated cystic lesion with macroscopic fat and central calcifications measuring up to 5 cm. See above comment and recommendation. Electronically Signed   By: Judie Petit.  Shick M.D.   On: 10/29/2020 11:49   CT Cervical Spine Wo Contrast  Result Date: 10/29/2020 CLINICAL DATA:  Seizure.  Unresponsive. EXAM: CT CERVICAL SPINE WITHOUT CONTRAST TECHNIQUE: Multidetector CT imaging of the cervical spine was performed without intravenous contrast. Multiplanar CT image reconstructions were also generated. COMPARISON:  None. FINDINGS: Alignment: Normal Skull base and vertebrae: No acute fracture. No primary bone lesion or focal pathologic process. Soft tissues and spinal canal: There is no signs of  canal hematoma. Diffuse prevertebral soft tissue edema is identified with a few foci of gas within the retropharyngeal and prevertebral soft tissues compatible with recent ACDF. Disc levels: Status post ACDF at C4-5. stable position of the anterior sideplate and screw device with interbody plug. Degenerative disc disease identified at C5-6 and C6-7. Upper chest: Negative. Other: None. IMPRESSION: 1. No evidence for cervical spine fracture. 2. Status post ACDF at C4-5. 3. Diffuse prevertebral soft tissue edema with a few foci of gas within the retropharyngeal and prevertebral soft tissues compatible with recent ACDF. Electronically Signed   By: Signa Kell M.D.   On: 10/29/2020 09:23   MR BRAIN W WO CONTRAST  Result Date: 10/29/2020 CLINICAL DATA:  Seizure. Abnormal neurological exam. Postictal state.  EXAM: MRI HEAD WITHOUT AND WITH CONTRAST TECHNIQUE: Multiplanar, multiecho pulse sequences of the brain and surrounding structures were obtained without and with intravenous contrast. CONTRAST:  10mL GADAVIST GADOBUTROL 1 MMOL/ML IV SOLN COMPARISON:  Head CT same day. FINDINGS: Brain: Diffusion imaging does not show any acute or subacute infarction. No abnormality affects the brainstem or cerebellum. The left cerebral hemispheres normal. In the right frontal lobe, there are a few small foci of T2 and FLAIR signal within the white matter and at the right gray-white junction. Mesial temporal lobes appear normal on both sides. No evidence of mass, hemorrhage, hydrocephalus or extra-axial collection. Vascular: Major vessels at the base of the brain show flow. Skull and upper cervical spine: Negative Sinuses/Orbits: Retention cyst in the right maxillary sinus. Sinuses otherwise clear. Orbits negative. Other: None IMPRESSION: No acute brain finding. Few small foci of T2 and FLAIR signal within the right frontal white matter and at the gray-white junction. These are often seen in asymptomatic individuals, but could  conceivably relate to seizure Electronically Signed   By: Paulina Fusi M.D.   On: 10/29/2020 17:25   MR THORACIC SPINE WO CONTRAST  Result Date: 10/30/2020 CLINICAL DATA:  50 year old female status post recent C4-5 ACDF, now with bilateral lower extremity weakness, loss of sensation with urination. EXAM: MRI THORACIC AND LUMBAR SPINE WITHOUT CONTRAST TECHNIQUE: Multiplanar and multiecho pulse sequences of the thoracic and lumbar spine were obtained without intravenous contrast. COMPARISON:  Comparison made with previous MRI from 05/01/2020. FINDINGS: MRI THORACIC SPINE FINDINGS Alignment: Physiologic with preservation of the normal thoracic kyphosis. Vertebrae: Vertebral body height maintained without acute or chronic fracture. Bone marrow signal intensity somewhat heterogeneous and diffusely decreased on T1 weighted imaging, nonspecific, but most commonly related to anemia, smoking or obesity. No worrisome osseous lesions. No abnormal marrow edema. Cord: Normal signal and morphology. No epidural hematoma or other collection. Paraspinal and other soft tissues: Paraspinous soft tissues within normal limits. Dependent atelectatic changes noted within the visualized lungs. Complex cystic lesion noted at the anteromedial aspect of the spleen, nonspecific, but possibly related to prior trauma. Disc levels: C7-T1: Central disc protrusion indents the ventral thecal sac, contacting the ventral cord (series 22, image 2). No significant stenosis or cord deformity. Foramina remain patent. T6-7: Right paracentral disc protrusion indents the right ventral thecal sac, contacting and mildly flattening the ventral cord (series 22, image 20). No cord signal changes or significant stenosis. Foramina remain patent. T7-8: Mild left eccentric disc bulge.  No significant stenosis. No other significant disc pathology seen within the thoracic spine. No significant spinal stenosis. Foramina remain patent. MRI LUMBAR SPINE FINDINGS  Segmentation: Standard. Lowest well-formed disc space labeled the L5-S1 level. Alignment: Physiologic with preservation of the normal lumbar lordosis. No listhesis. Vertebrae: Vertebral body height maintained without acute or interval fracture. Bone marrow signal intensity mildly heterogeneous and diffusely decreased on T1 weighted imaging, nonspecific, but most commonly related to anemia, smoking, or obesity. Few small benign hemangiomata noted. No worrisome osseous lesions. No abnormal marrow edema. Conus medullaris and cauda equina: Conus extends to the T12-L1 level. Conus and cauda equina appear normal. Paraspinal and other soft tissues: Paraspinous soft tissues within normal limits. Visualized visceral structures are normal. Disc levels: L1-2:  Unremarkable. L2-3: Minimal left eccentric disc bulge. No spinal stenosis. Foramina remain patent. L3-4: Mild disc bulge. Superimposed small biforaminal disc protrusions closely approximate and/or contacts both of the exiting L3 nerve roots as they course out of the neural foramina (series 4, image  23), slightly greater on the right. Minimal facet hypertrophy. No spinal stenosis. Foramina remain patent. L4-5: Disc bulge with disc desiccation. Superimposed left foraminal disc protrusion with annular fissure, closely approximating and/or contacting the exiting left L4 nerve root (series 4, image 27). Mild narrowing of the left lateral recess with mild left foraminal stenosis. Right neural foramen remains patent as does the central canal. L5-S1:  Unremarkable. IMPRESSION: MR THORACIC SPINE IMPRESSION: 1. Normal MRI appearance of the thoracic spinal cord. No significant spinal stenosis or findings to explain patient's symptoms. 2. Small disc protrusions at C7-T1 and T6-7 without significant stenosis. 3. 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 Finding is indeterminate, but could be related to remote trauma or other insult. Further  evaluation with dedicated cross-sectional imaging of the abdomen and pelvis suggested for complete characterization. MRI LUMBAR SPINE IMPRESSION: 1. Normal MRI appearance of the conus medullaris and cauda equina. No high-grade spinal stenosis or findings to explain patient's symptoms. 2. Left foraminal disc protrusion with annular fissure at L4-5, potentially affecting the exiting left L4 nerve root. 3. Small biforaminal disc protrusions at L3-4. Either of the exiting L3 nerve roots could be affected. Electronically Signed   By: Rise Mu M.D.   On: 10/30/2020 20:36   MR LUMBAR SPINE WO CONTRAST  Result Date: 10/30/2020 CLINICAL DATA:  50 year old female status post recent C4-5 ACDF, now with bilateral lower extremity weakness, loss of sensation with urination. EXAM: MRI THORACIC AND LUMBAR SPINE WITHOUT CONTRAST TECHNIQUE: Multiplanar and multiecho pulse sequences of the thoracic and lumbar spine were obtained without intravenous contrast. COMPARISON:  Comparison made with previous MRI from 05/01/2020. FINDINGS: MRI THORACIC SPINE FINDINGS Alignment: Physiologic with preservation of the normal thoracic kyphosis. Vertebrae: Vertebral body height maintained without acute or chronic fracture. Bone marrow signal intensity somewhat heterogeneous and diffusely decreased on T1 weighted imaging, nonspecific, but most commonly related to anemia, smoking or obesity. No worrisome osseous lesions. No abnormal marrow edema. Cord: Normal signal and morphology. No epidural hematoma or other collection. Paraspinal and other soft tissues: Paraspinous soft tissues within normal limits. Dependent atelectatic changes noted within the visualized lungs. Complex cystic lesion noted at the anteromedial aspect of the spleen, nonspecific, but possibly related to prior trauma. Disc levels: C7-T1: Central disc protrusion indents the ventral thecal sac, contacting the ventral cord (series 22, image 2). No significant stenosis or  cord deformity. Foramina remain patent. T6-7: Right paracentral disc protrusion indents the right ventral thecal sac, contacting and mildly flattening the ventral cord (series 22, image 20). No cord signal changes or significant stenosis. Foramina remain patent. T7-8: Mild left eccentric disc bulge.  No significant stenosis. No other significant disc pathology seen within the thoracic spine. No significant spinal stenosis. Foramina remain patent. MRI LUMBAR SPINE FINDINGS Segmentation: Standard. Lowest well-formed disc space labeled the L5-S1 level. Alignment: Physiologic with preservation of the normal lumbar lordosis. No listhesis. Vertebrae: Vertebral body height maintained without acute or interval fracture. Bone marrow signal intensity mildly heterogeneous and diffusely decreased on T1 weighted imaging, nonspecific, but most commonly related to anemia, smoking, or obesity. Few small benign hemangiomata noted. No worrisome osseous lesions. No abnormal marrow edema. Conus medullaris and cauda equina: Conus extends to the T12-L1 level. Conus and cauda equina appear normal. Paraspinal and other soft tissues: Paraspinous soft tissues within normal limits. Visualized visceral structures are normal. Disc levels: L1-2:  Unremarkable. L2-3: Minimal left eccentric disc bulge. No spinal stenosis. Foramina remain patent. L3-4: Mild disc  bulge. Superimposed small biforaminal disc protrusions closely approximate and/or contacts both of the exiting L3 nerve roots as they course out of the neural foramina (series 4, image 23), slightly greater on the right. Minimal facet hypertrophy. No spinal stenosis. Foramina remain patent. L4-5: Disc bulge with disc desiccation. Superimposed left foraminal disc protrusion with annular fissure, closely approximating and/or contacting the exiting left L4 nerve root (series 4, image 27). Mild narrowing of the left lateral recess with mild left foraminal stenosis. Right neural foramen remains  patent as does the central canal. L5-S1:  Unremarkable. IMPRESSION: MR THORACIC SPINE IMPRESSION: 1. Normal MRI appearance of the thoracic spinal cord. No significant spinal stenosis or findings to explain patient's symptoms. 2. Small disc protrusions at C7-T1 and T6-7 without significant stenosis. 3. 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 Finding is indeterminate, but could be related to remote trauma or other insult. Further evaluation with dedicated cross-sectional imaging of the abdomen and pelvis suggested for complete characterization. MRI LUMBAR SPINE IMPRESSION: 1. Normal MRI appearance of the conus medullaris and cauda equina. No high-grade spinal stenosis or findings to explain patient's symptoms. 2. Left foraminal disc protrusion with annular fissure at L4-5, potentially affecting the exiting left L4 nerve root. 3. Small biforaminal disc protrusions at L3-4. Either of the exiting L3 nerve roots could be affected. Electronically Signed   By: Rise Mu M.D.   On: 10/30/2020 20:36   EEG adult  Result Date: 10/29/2020 Jefferson Fuel, MD     10/29/2020  2:23 PM Routine EEG Report ESHANI MAESTRE is a 50 y.o. female with a history of  who is undergoing an EEG to evaluate for seizures. Report: This EEG was acquired with electrodes placed according to the International 10-20 electrode system (including Fp1, Fp2, F3, F4, C3, C4, P3, P4, O1, O2, T3, T4, T5, T6, A1, A2, Fz, Cz, Pz). The following electrodes were missing or displaced: none. The occipital dominant rhythm was 9 Hz. This activity is reactive to stimulation. Drowsiness was manifested by background fragmentation; deeper stages of sleep were identified by K complexes and sleep spindles. There was no focal slowing. There were no interictal epileptiform discharges. There were no electrographic seizures identified. There was no abnormal response to photic stimulation or hyperventilation. Impression: This  EEG was obtained while awake and asleep and is normal.   Clinical Correlation: Normal EEGs, however, do not rule out epilepsy. Bing Neighbors, MD [facility] [phone] If 7pm- 7am, please page neurology on call as listed in AMION.       Scheduled Meds:  aspirin EC  81 mg Oral Daily   cholecalciferol  1,000 Units Oral Daily   levothyroxine  12.5 mcg Oral QAC breakfast   methocarbamol  500 mg Oral Q6H   rosuvastatin  40 mg Oral QHS   senna-docusate  1 tablet Oral BID   Continuous Infusions:   LOS: 0 days    Time spent: 33 mins     Charise Killian, MD [facility] Pager [phone]  If 7PM-7AM, please contact night-coverage 10/31/2020, 7:27 AM

## 2020-10-31 NOTE — Evaluation (Signed)
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 Pt underwent C4/5 ACDF on 9/19 Meadows Dr. Lucy Chris. Evening after surgery RR called due to elevated HR 130s, increased RR, heightened anxiety. Neurology consulted, MRI obtained without clear correlation, seziure suspected to be secondary to [facility]. Neurosurgical team also following.  Clinical Impression  Pt admitted with above diagnosis. Pt currently with functional limitations due to the deficits listed below (see "PT Problem List"). Upon entry, pt in bed, awake and agreeable to participate. Pt familiar to author from PT eval 3 days prior. The pt is alert, pleasant, interactive, and able to given some updates on mobility after prior DC. Pt/DTR report no seizure like activity for past 24hrs. Pt has dizziness at EOB that remains static throughout remainder of session, given stability and range of BP values and stable symptoms, BP is not strongly correlated as a factor. Pt has tachycardia at EOB to 110s, then 120s each time she stands of 3 trials. Pt eventually is agreeable to AMB around bed to recliner, HR at 130bpm until return to sitting, pt reporting perception of shaking or tremor in legs and chest. Pt also reports a few instances of delayed processing/problem solving while up AMB. Additional AMB not pursued at this time given symptoms and HR response. Of note: rapid response called even of surgery day with pt having resting tachycardia issues. Prior PT evaluation pt able to AMB around nursing unit twice without device, today, pt requires a RW and is unable to confidently AMB outside of the room. Patient's performance this date reveals decreased ability, independence, and tolerance  in performing all basic mobility required for performance of activities of daily living. Pt requires additional DME, close physical assistance, and cues for safe participate in mobility. Pt will benefit from skilled PT intervention to increase independence and safety with basic mobility in preparation for discharge to the venue listed below.        Recommendations for follow up therapy are one component of a multi-disciplinary discharge planning process, led by the attending physician.  Recommendations may be updated based on patient status, additional functional criteria and insurance authorization.  Follow Up Recommendations Follow surgeon's recommendation for DC plan and follow-up therapies;Home health PT    Equipment Recommendations  Rolling walker with 5" wheels    Recommendations for Other Services       Precautions / Restrictions Precautions Precautions: Fall Restrictions Weight Bearing Restrictions: No Other Position/Activity Restrictions: no brace required per MD order. Pt reports requesting a soft collar for overnight wear to minimize pain/discomfort and suboptimal cervical positioning while asleep.      Mobility  Bed Mobility Overal bed mobility: Needs Assistance Bed Mobility: Supine to Sit     Supine to sit: Supervision;HOB elevated     General bed mobility comments: to Rt EOB    Transfers Overall transfer level: Needs assistance Equipment used: Rolling walker (2 wheeled);None Transfers: Sit to/from Stand Sit to Stand: Supervision         General transfer comment: 3-4x in session, has shaking in legs, chest, poorly described; HR increases to 110s at EOB, remains there; Jumps to 120s each time stnading for <30sec.  Ambulation/Gait Ambulation/Gait assistance: Min guard Gait Distance (Feet): 8 Feet  Assistive device: Rolling walker (2 wheeled)          Stairs            Wheelchair Mobility    Modified Rankin (Stroke Patients Only)        Balance Overall balance assessment: Mild deficits observed, not formally tested                                           Pertinent Vitals/Pain Pain Score: 8  Pain Location: neck pain Pain Descriptors / Indicators: Aching Pain Intervention(s): Limited activity within patient's tolerance;Monitored during session;Premedicated before session    Home Living Family/patient expects to be discharged to:: Private residence Living Arrangements: Children Available Help at Discharge: Available 24 hours/day;Family Type of Home: Apartment Home Access: Stairs to enter   [employer] of Steps: ramp and partial step at doorway Home Layout: One level Home Equipment: None      Prior Function Level of Independence: Independent         Comments: Pt endorses fall this past Sunday while getting in/out of shower.     Hand Dominance        Extremity/Trunk Assessment   Upper Extremity Assessment Upper Extremity Assessment: Generalized weakness;Overall [facility] for tasks assessed    Lower Extremity Assessment Lower Extremity Assessment: Generalized weakness;Overall WFL for tasks assessed       Communication   Communication: No difficulties  Cognition Arousal/Alertness: Awake/alert Behavior During Therapy: WFL for tasks assessed/performed Overall Cognitive Status: Within Functional Limits for tasks assessed                                        General Comments      Exercises     Assessment/Plan    PT Assessment Patient needs continued PT services  PT Problem List Decreased balance;Decreased mobility;Decreased activity tolerance;Decreased strength;Cardiopulmonary status limiting activity       PT Treatment Interventions DME instruction;Balance training;Gait training;Stair training;Functional mobility training;Therapeutic activities;Therapeutic exercise;Patient/family education    PT Goals (Current goals can be found in the Care  Plan section)  Acute Rehab PT Goals Patient Stated Goal: minimize pain and get better PT Goal Formulation: With patient Time For Goal Achievement: 11/11/20 Potential to Achieve Goals: Fair    Frequency 7X/week   Barriers to discharge        Co-evaluation               AM-PAC PT "6 Clicks" Mobility  Outcome Measure Help needed turning from your back to your side while in a flat bed without using bedrails?: A Little Help needed moving from lying on your back to sitting on the side of a flat bed without using bedrails?: A Little Help needed moving to and from a bed to a chair (including a wheelchair)?: A Little Help needed standing up from a chair using your arms (e.g., wheelchair or bedside chair)?: A Little Help needed to walk in hospital room?: A Little Help needed climbing 3-5 steps with a railing? : A Lot 6 Click Score: 17    End of Session Equipment Utilized During Treatment: Gait belt Activity Tolerance: Patient tolerated treatment well;No increased pain Patient left: in chair;with call bell/phone within reach;with nursing/sitter in room Nurse Communication: Mobility status PT Visit Diagnosis: Unsteadiness on feet (R26.81);Difficulty in walking,  not elsewhere classified (R26.2)    Time: 4536-4680 PT Time Calculation (min) (ACUTE ONLY): 27 min   Charges:   PT Evaluation $PT Eval High Complexity: 1 High PT Treatments $Therapeutic Activity: 8-22 mins       2:24 PM, 10/31/20 Rosamaria Lints, PT, DPT Physical Therapist - [facility]  [phone] (ASCOM)    Leslie Meadows 10/31/2020, 2:16 PM

## 2020-10-31 NOTE — Progress Notes (Signed)
No thoracic or lumbar spinal abnormality to explain her BLE weakness is seen on MRI.   Suspect a non-organic etiology for her weakness and seizure-like spells; DDx includes conversion disorder, factitious disorder or malingering. Stress can precipitate neurological symptoms when the etiology is non-organic.   Would obtain PT consult. PT is often helpful in mobilizing patients when the etiology for weakness is functional.   Electronically signed: Dr. Caryl Pina

## 2020-10-31 NOTE — Progress Notes (Signed)
Patient arrived to room 2A45 from ED.  Assessment complete, VS obtained, and Admission database began.

## 2020-10-31 NOTE — Progress Notes (Signed)
OT Cancellation Note  Patient Details Name: Leslie Meadows MRN: [id number] DOB: [birth year]   Cancelled Treatment:    Reason Eval/Treat Not Completed: Patient at procedure or test/ unavailable OT consult received and chart reviewed. OT attempted x2 this AM and pt was seated on BSC attempting to have BM on both trials. Pt has a visitor present. RN is aware that she is still on commode. Will attempt to f/u at later date/time for OT evaluation. Thank you.  Rejeana Brock, MS, OTR/L ascom [phone] 10/31/20, 11:53 AM

## 2020-10-31 NOTE — Progress Notes (Signed)
Attending Progress Note  History: Leslie Meadows is here for Episodes of altered mental status, new lower extremity weakness with unexplained etiology.  C4-5 ACDF on 9/19  Pt sitting up in bed watching TV this morning. Was able to assist with attempt to turn volume down and remove soft cervical collar.  Physical Exam: Vitals:   10/31/20 0554 10/31/20 0842  BP: (!) 123/95 (!) 132/91  Pulse:  90  Resp: 16   Temp: 98.2 F (36.8 C) 98.1 F (36.7 C)  SpO2: 98% 100%    AA Ox3 CNI  Strength:5/5 throughout BUE 2/5 throughout BLE TTP of lower extremities. Withdrawals to pain in bilateral lower extremities. 2+ patellar reflexes.  Incision: c/d/I with dermabond in place  Data:  Recent Labs  Lab 10/29/20 0632 10/30/20 0712 10/31/20 0515  NA 138 135 136  K 3.8 3.7 4.4  CL 102 97* 100  CO2 27 26 23   BUN 20 15 14   CREATININE 1.01* 0.81 0.61  GLUCOSE 107* 104* 103*  CALCIUM 9.5 9.1 9.0   Recent Labs  Lab 10/29/20 0632  AST 23  ALT 20  ALKPHOS 52     Recent Labs  Lab 10/28/20 0909 10/29/20 0632 10/31/20 0515  WBC 11.0* 8.1 4.9  HGB 14.8 14.6 13.9  HCT 43.7 43.8 43.2  PLT 244 234 141*   No results for input(s): APTT, INR in the last 168 hours.       Other tests/results:   CT C-spine: IMPRESSION: 1. No evidence for cervical spine fracture. 2. Status post ACDF at C4-5. 3. Diffuse prevertebral soft tissue edema with a few foci of gas within the retropharyngeal and prevertebral soft tissues compatible with recent ACDF.  Electronically Signed   By: 10/31/20 M.D.   On: 10/29/2020 09:23    EEG 10/29/20 Impression: This EEG was obtained while awake and asleep and is normal.  MRI T and L spine 10/30/20 IMPRESSION: MR THORACIC SPINE IMPRESSION:   1. Normal MRI appearance of the thoracic spinal cord. No significant spinal stenosis or findings to explain patient's symptoms. 2. Small disc protrusions at C7-T1 and T6-7 without  significant stenosis. 3. 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 Finding is indeterminate, but could be related to remote trauma or other insult. Further evaluation with dedicated cross-sectional imaging of the abdomen and pelvis suggested for complete characterization.   MRI LUMBAR SPINE IMPRESSION:   1. Normal MRI appearance of the conus medullaris and cauda equina. No high-grade spinal stenosis or findings to explain patient's symptoms. 2. Left foraminal disc protrusion with annular fissure at L4-5, potentially affecting the exiting left L4 nerve root. 3. Small biforaminal disc protrusions at L3-4. Either of the exiting L3 nerve roots could be affected.     Electronically Signed   By: 10/31/20 M.D.   On: 10/30/2020 20:36  Assessment/Plan:  Leslie Meadows is a 50 year old female presenting with new episodes of altered mental status concerning for possible seizure activity and new weakness after a C4-5 ACDF.  Work-up thus far has been negative for any metabolic or anatomic explanation for etiology.  - Thoracic and Lumbar MRIs are negative for any compressive pathology that explains her lower extremity weakness. There is low suspicion for cervical pathology given her intact upper extremity function and intact sensation to pain throughout; given her otherwise negative work-up thus far we will obtain a cervical MRI to definitively rule out any additional cervical pathology at this time. -Continue  neurology work-up for other explanatory etiology at the discretion of neurology. -No plan for neurosurgical intervention at this time. -Okay to mobilize and work with therapy from a neurosurgical standpoint.  Manning Charity PA-C Department of Neurosurgery

## 2020-11-01 DIAGNOSIS — M542 Cervicalgia: Principal | ICD-10-CM

## 2020-11-01 DIAGNOSIS — R1310 Dysphagia, unspecified: Secondary | ICD-10-CM

## 2020-11-01 LAB — CBC
HCT: 41.9 % (ref 36.0–46.0)
Hemoglobin: 14.3 g/dL (ref 12.0–15.0)
MCH: 28.6 pg (ref 26.0–34.0)
MCHC: 34.1 g/dL (ref 30.0–36.0)
MCV: 83.8 fL (ref 80.0–100.0)
Platelets: 243 10*3/uL (ref 150–400)
RBC: 5 MIL/uL (ref 3.87–5.11)
RDW: 13.9 % (ref 11.5–15.5)
WBC: 6.7 10*3/uL (ref 4.0–10.5)
nRBC: 0 % (ref 0.0–0.2)

## 2020-11-01 LAB — BASIC METABOLIC PANEL
Anion gap: 10 (ref 5–15)
BUN: 13 mg/dL (ref 6–20)
CO2: 27 mmol/L (ref 22–32)
Calcium: 9.1 mg/dL (ref 8.9–10.3)
Chloride: 100 mmol/L (ref 98–111)
Creatinine, Ser: 0.74 mg/dL (ref 0.44–1.00)
GFR, Estimated: >60 mL/min (ref >60)
Glucose, Bld: 130 mg/dL — ABNORMAL HIGH (ref 70–99)
Potassium: 4.2 mmol/L (ref 3.5–5.1)
Sodium: 137 mmol/L (ref 135–145)

## 2020-11-01 LAB — GLUCOSE, CAPILLARY
Glucose-Capillary: 108 mg/dL — ABNORMAL HIGH (ref 70–99)
Glucose-Capillary: 166 mg/dL — ABNORMAL HIGH (ref 70–99)
Glucose-Capillary: 141 mg/dL — ABNORMAL HIGH (ref 70–99)

## 2020-11-01 MED ORDER — BUTALBITAL-APAP-CAFFEINE 50-325-40 MG PO TABS: 2.0000 | ORAL_TABLET | Freq: Four times a day (QID) | ORAL | Status: DC | PRN

## 2020-11-01 MED ORDER — BUTALBITAL-APAP-CAFFEINE 50-325-40 MG PO TABS
2.0000 | ORAL_TABLET | Freq: Three times a day (TID) | ORAL | Status: DC | PRN
  Administered 2020-11-01 – 2020-11-02 (×3): 2 via ORAL
  Filled 2020-11-01 (×3): qty 2

## 2020-11-01 NOTE — TOC Transition Note (Signed)
Transition of Care [facility]) - CM/SW Discharge Note   Patient Details  Name: Leslie Meadows MRN: [id number] Date of Birth: 07/21/1970  [facility] of [facility]) CM/SW Contact:  Liliana Cline, LCSW Phone Number: 11/01/2020, 12:10 PM   Clinical Narrative:   Per rounds, patient to possibly DC today pending pain control. Home health PT and OT have been ordered. RW also recommended. CSW spoke with patient who reported Workers [employer] already ordered a [facility] and RW for her.  CSW attempted calls to Workers [employer] - main # and patient's case worker Sharlyn Bologna- regarding HH. Unable to reach. Updated patient who stated they aren't there on the weekends and she will call them Monday to follow up on getting HH arranged. CSW left VM for caseworker Mare Loan and faxed HH orders to [facility] for Monday follow up.  No additional needs.     Final next level of care: Home w Home Health Services Barriers to Discharge: Barriers Resolved   Patient Goals and CMS Choice Patient states their goals for this hospitalization and ongoing recovery are:: home with home health - to be arranged by workers compensation department CMS Medicare.gov Compare Post Acute Care list provided to:: Patient Choice offered to / list presented to : Patient  Discharge Placement                  Name of family member notified: patient notified Patient and family notified of of transfer: 11/01/20  Discharge Plan and Services                          HH Arranged: PT, OT          Social Determinants of Health (SDOH) Interventions     Readmission Risk Interventions No flowsheet data found.

## 2020-11-01 NOTE — Progress Notes (Signed)
PROGRESS NOTE    Leslie Meadows  IOE:[id number] DOB: 1971/01/15 DOA: 10/29/2020 PCP: Margaretann Loveless, MD    Assessment & Plan:   Principal Problem:   Seizure [facility]) Active Problems:   Cervical myelopathy (HCC)   HLD (hyperlipidemia)   Hypothyroidism   OSA on CPAP   Chest pain   Neck pain   Unlikely seizure: CT head negative for acute intracranial abnormalities. MRI brain shows few small foci of T2 & flair signal w/in right frontal white matter & at gray-white junction. EEG shows no seizure activity. PT recs home health.  No seizures meds indicated as per neuro. D/c tramadol. Neuro signed off.  Hx of cervical stenosis & myelopathy: s/p C4-5 ADCF on 10/27/20 as per neuro surg. MRI T&L spine results as per neuro surg. PT recs home health. Neuro surg signed off   Dysphagia: continue on dysphagia III diet as per speech   Pre-DM: HbA1c 6.3. Would benefit from significant weight loss  Thrombocytopenia: resolved    HLD: continue on statin    Hypothyroidism: continue on home dose of sythroid    OSA: CPAP qhs   Chest pain: etiology unclear, possibly MSK.  CT angiogram of chest is limited study, but is negative for central PE. Troponins are not elevated. Resolved   Splenic lesion: CTA showed  a left upper quadrant subdiaphragmatic low-attenuation complex cystic lesion with macroscopic fat and septal calcifications this appears to originate from the splenic upper pole. Measures 5 cm and has a indolent chronic appearance. Needs to f/u with PCP with dedicated abdomen CT without and with contrast in 3 months as an outpatient.  Morbid obesity: BMI 48.4. Complicates overall care & prognosis    DVT prophylaxis: SCDs Code Status: full  Family Communication:  Disposition Plan:  likely back home  Level of care: Progressive Cardiac  Status is: Inpatient  Remains inpatient appropriate because:Inpatient level of care appropriate due to severity of illness  Dispo: The patient is from: Home               Anticipated d/c is to: Home              Patient currently is not medically stable to d/c.   Difficult to place patient : unclear     Consultants:  Neuro Neuro surg   Procedures:   Antimicrobials:   Subjective: Pt c/o headache still   Objective: Vitals:   10/31/20 1955 10/31/20 2046 10/31/20 2355 11/01/20 0325  BP: (!) 137/96  (!) 138/95 124/84  Pulse: (!) 102 96 (!) 103 95  Resp: 18  18 16   Temp: (!) 97.5 F (36.4 C)  97.8 F (36.6 C) 98.4 F (36.9 C)  TempSrc:   Oral Oral  SpO2: 98% 94% 90% 98%  Weight:      Height:        Intake/Output Summary (Last 24 hours) at 11/01/2020 0731 Last data filed at 10/31/2020 1358 Gross per 24 hour  Intake 480 ml  Output --  Net 480 ml   Filed Weights   10/29/20 1744  Weight: 120.2 kg    Examination:  General exam: Appears calm & comfortable. Morbidly obese Respiratory system: decreased breath sounds b/l otherwise clear Cardiovascular system: S1 & S2+. No clicks or rubs  Gastrointestinal system: Abd is soft, NT, obese & normal bowel sounds  Central nervous system: alert and oriented. Moves all extremities  Psychiatry: Judgement and insight appear normal. Flat mood and affect    Data Reviewed: I have personally  reviewed following labs and imaging studies  CBC: Recent Labs  Lab 10/28/20 0909 10/29/20 0632 10/31/20 0515 11/01/20 0614  WBC 11.0* 8.1 4.9 6.7  NEUTROABS  --  4.6  --   --   HGB 14.8 14.6 13.9 14.3  HCT 43.7 43.8 43.2 41.9  MCV 84.9 83.6 89.1 83.8  PLT 244 234 141* 243   Basic Metabolic Panel: Recent Labs  Lab 10/28/20 0909 10/29/20 0632 10/30/20 0712 10/31/20 0515 11/01/20 0614  NA 137 138 135 136 137  K 4.6 3.8 3.7 4.4 4.2  CL 102 102 97* 100 100  CO2 GLUCOSE 122* 107* 104* 103* 130*  BUN CREATININE 0.80 1.01* 0.81 0.61 0.74  CALCIUM 9.6 9.5 9.1 9.0 9.1  MG  --  2.2  --   --   --    GFR: Estimated Creatinine Clearance: 104.9 mL/min (by C-G  formula based on SCr of 0.74 mg/dL). Liver Function Tests: Recent Labs  Lab 10/29/20 0632  AST 23  ALT 20  ALKPHOS 52  BILITOT 0.6  PROT 7.8  ALBUMIN 4.1   No results for input(s): LIPASE, AMYLASE in the last 168 hours. No results for input(s): AMMONIA in the last 168 hours. Coagulation Profile: No results for input(s): INR, PROTIME in the last 168 hours. Cardiac Enzymes: No results for input(s): CKTOTAL, CKMB, CKMBINDEX, TROPONINI in the last 168 hours. BNP (last 3 results) No results for input(s): PROBNP in the last 8760 hours. HbA1C: No results for input(s): HGBA1C in the last 72 hours.  CBG: Recent Labs  Lab 10/27/20 2051 10/29/20 0802 10/30/20 0750 10/31/20 0835  GLUCAP 192* 93 102* 93   Lipid Profile: Recent Labs    10/30/20 0712  CHOL 207*  HDL 44  LDLCALC 142*  TRIG 106  CHOLHDL 4.7   Thyroid Function Tests: No results for input(s): TSH, T4TOTAL, FREET4, T3FREE, THYROIDAB in the last 72 hours. Anemia Panel: No results for input(s): VITAMINB12, FOLATE, FERRITIN, TIBC, IRON, RETICCTPCT in the last 72 hours. Sepsis Labs: No results for input(s): PROCALCITON, LATICACIDVEN in the last 168 hours.  Recent Results (from the past 240 hour(s))  SARS CORONAVIRUS 2 (TAT 6-24 HRS) Nasopharyngeal Nasopharyngeal Swab     Status: None   Collection Time: 10/23/20 12:02 PM   Specimen: Nasopharyngeal Swab  Result Value Ref Range Status   SARS Coronavirus 2 NEGATIVE NEGATIVE Final    Comment: (NOTE) SARS-CoV-2 target nucleic acids are NOT DETECTED.  The SARS-CoV-2 RNA is generally detectable in upper and lower respiratory specimens during the acute phase of infection. Negative results do not preclude SARS-CoV-2 infection, do not rule out co-infections with other pathogens, and should not be used as the sole basis for treatment or other patient management decisions. Negative results must be combined with clinical observations, patient history, and epidemiological  information. The expected result is Negative.  Fact Sheet for Patients: HairSlick.no  Fact Sheet for Healthcare Providers: quierodirigir.com  This test is not yet approved or cleared by the [place] FDA and  has been authorized for detection and/or diagnosis of SARS-CoV-2 by FDA under an Emergency Use Authorization (EUA). This EUA will remain  in effect (meaning this test can be used) for the duration of the COVID-19 declaration under Se ction 564(b)(1) of the Act, 21 U.S.C. section 360bbb-3(b)(1), unless the authorization is terminated or revoked sooner.  Performed at [facility], [address] [place], [place] [address]  Resp Panel by RT-PCR (Flu A&B, Covid) Nasopharyngeal Swab     Status: None   Collection Time: 10/29/20  7:46 AM   Specimen: Nasopharyngeal Swab; Nasopharyngeal(NP) swabs in vial transport medium  Result Value Ref Range Status   SARS Coronavirus 2 by RT PCR NEGATIVE NEGATIVE Final    Comment: (NOTE) SARS-CoV-2 target nucleic acids are NOT DETECTED.  The SARS-CoV-2 RNA is generally detectable in upper respiratory specimens during the acute phase of infection. The lowest concentration of SARS-CoV-2 viral copies this assay can detect is 138 copies/mL. A negative result does not preclude SARS-Cov-2 infection and should not be used as the sole basis for treatment or other patient management decisions. A negative result may occur with  improper specimen collection/handling, submission of specimen other than nasopharyngeal swab, presence of viral mutation(s) within the areas targeted by this assay, and inadequate number of viral copies(<138 copies/mL). A negative result must be combined with clinical observations, patient history, and epidemiological information. The expected result is Negative.  Fact Sheet for Patients:  BloggerCourse.com  Fact Sheet for Healthcare  Providers:  SeriousBroker.it  This test is no t yet approved or cleared by the [place] FDA and  has been authorized for detection and/or diagnosis of SARS-CoV-2 by FDA under an Emergency Use Authorization (EUA). This EUA will remain  in effect (meaning this test can be used) for the duration of the COVID-19 declaration under Section 564(b)(1) of the Act, 21 U.S.C.section 360bbb-3(b)(1), unless the authorization is terminated  or revoked sooner.       Influenza A by PCR NEGATIVE NEGATIVE Final   Influenza B by PCR NEGATIVE NEGATIVE Final    Comment: (NOTE) The Xpert Xpress SARS-CoV-2/FLU/RSV plus assay is intended as an aid in the diagnosis of influenza from Nasopharyngeal swab specimens and should not be used as a sole basis for treatment. Nasal washings and aspirates are unacceptable for Xpert Xpress SARS-CoV-2/FLU/RSV testing.  Fact Sheet for Patients: BloggerCourse.com  Fact Sheet for Healthcare Providers: SeriousBroker.it  This test is not yet approved or cleared by the [place] FDA and has been authorized for detection and/or diagnosis of SARS-CoV-2 by FDA under an Emergency Use Authorization (EUA). This EUA will remain in effect (meaning this test can be used) for the duration of the COVID-19 declaration under Section 564(b)(1) of the Act, 21 U.S.C. section 360bbb-3(b)(1), unless the authorization is terminated or revoked.  Performed at [facility], [address] [place], [place] [address]   CULTURE, BLOOD (ROUTINE X 2) w Reflex to ID Panel     Status: None (Preliminary result)   Collection Time: 10/30/20  3:17 PM   Specimen: BLOOD  Result Value Ref Range Status   Specimen Description BLOOD BLOOD RIGHT HAND  Final   Special Requests   Final    BOTTLES DRAWN AEROBIC AND ANAEROBIC Blood Culture results may not be optimal due to an inadequate volume of blood received in  culture bottles   Culture   Final    NO GROWTH 2 DAYS Performed at [facility], [address], [place], [place] [address]    Report Status PENDING  Incomplete  CULTURE, BLOOD (ROUTINE X 2) w Reflex to ID Panel     Status: None (Preliminary result)   Collection Time: 10/30/20  3:17 PM   Specimen: BLOOD  Result Value Ref Range Status   Specimen Description BLOOD BLOOD LEFT HAND  Final   Special Requests   Final    BOTTLES DRAWN AEROBIC AND ANAEROBIC  Blood Culture results may not be optimal due to an inadequate volume of blood received in culture bottles   Culture   Final    NO GROWTH 2 DAYS Performed at [facility], [address] [place], [place] [address]    Report Status PENDING  Incomplete         Radiology Studies: MR THORACIC SPINE WO CONTRAST  Result Date: 10/30/2020 CLINICAL DATA:  50 year old female status post recent C4-5 ACDF, now with bilateral lower extremity weakness, loss of sensation with urination. EXAM: MRI THORACIC AND LUMBAR SPINE WITHOUT CONTRAST TECHNIQUE: Multiplanar and multiecho pulse sequences of the thoracic and lumbar spine were obtained without intravenous contrast. COMPARISON:  Comparison made with previous MRI from 05/01/2020. FINDINGS: MRI THORACIC SPINE FINDINGS Alignment: Physiologic with preservation of the normal thoracic kyphosis. Vertebrae: Vertebral body height maintained without acute or chronic fracture. Bone marrow signal intensity somewhat heterogeneous and diffusely decreased on T1 weighted imaging, nonspecific, but most commonly related to anemia, smoking or obesity. No worrisome osseous lesions. No abnormal marrow edema. Cord: Normal signal and morphology. No epidural hematoma or other collection. Paraspinal and other soft tissues: Paraspinous soft tissues within normal limits. Dependent atelectatic changes noted within the visualized lungs. Complex cystic lesion noted at the anteromedial aspect of the spleen, nonspecific,  but possibly related to prior trauma. Disc levels: C7-T1: Central disc protrusion indents the ventral thecal sac, contacting the ventral cord (series 22, image 2). No significant stenosis or cord deformity. Foramina remain patent. T6-7: Right paracentral disc protrusion indents the right ventral thecal sac, contacting and mildly flattening the ventral cord (series 22, image 20). No cord signal changes or significant stenosis. Foramina remain patent. T7-8: Mild left eccentric disc bulge.  No significant stenosis. No other significant disc pathology seen within the thoracic spine. No significant spinal stenosis. Foramina remain patent. MRI LUMBAR SPINE FINDINGS Segmentation: Standard. Lowest well-formed disc space labeled the L5-S1 level. Alignment: Physiologic with preservation of the normal lumbar lordosis. No listhesis. Vertebrae: Vertebral body height maintained without acute or interval fracture. Bone marrow signal intensity mildly heterogeneous and diffusely decreased on T1 weighted imaging, nonspecific, but most commonly related to anemia, smoking, or obesity. Few small benign hemangiomata noted. No worrisome osseous lesions. No abnormal marrow edema. Conus medullaris and cauda equina: Conus extends to the T12-L1 level. Conus and cauda equina appear normal. Paraspinal and other soft tissues: Paraspinous soft tissues within normal limits. Visualized visceral structures are normal. Disc levels: L1-2:  Unremarkable. L2-3: Minimal left eccentric disc bulge. No spinal stenosis. Foramina remain patent. L3-4: Mild disc bulge. Superimposed small biforaminal disc protrusions closely approximate and/or contacts both of the exiting L3 nerve roots as they course out of the neural foramina (series 4, image 23), slightly greater on the right. Minimal facet hypertrophy. No spinal stenosis. Foramina remain patent. L4-5: Disc bulge with disc desiccation. Superimposed left foraminal disc protrusion with annular fissure, closely  approximating and/or contacting the exiting left L4 nerve root (series 4, image 27). Mild narrowing of the left lateral recess with mild left foraminal stenosis. Right neural foramen remains patent as does the central canal. L5-S1:  Unremarkable. IMPRESSION: MR THORACIC SPINE IMPRESSION: 1. Normal MRI appearance of the thoracic spinal cord. No significant spinal stenosis or findings to explain patient's symptoms. 2. Small disc protrusions at C7-T1 and T6-7 without significant stenosis. 3. 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 Finding is indeterminate, but could be related to remote trauma or other insult. Further evaluation  with dedicated cross-sectional imaging of the abdomen and pelvis suggested for complete characterization. MRI LUMBAR SPINE IMPRESSION: 1. Normal MRI appearance of the conus medullaris and cauda equina. No high-grade spinal stenosis or findings to explain patient's symptoms. 2. Left foraminal disc protrusion with annular fissure at L4-5, potentially affecting the exiting left L4 nerve root. 3. Small biforaminal disc protrusions at L3-4. Either of the exiting L3 nerve roots could be affected. Electronically Signed   By: Rise Mu M.D.   On: 10/30/2020 20:36   MR LUMBAR SPINE WO CONTRAST  Result Date: 10/30/2020 CLINICAL DATA:  50 year old female status post recent C4-5 ACDF, now with bilateral lower extremity weakness, loss of sensation with urination. EXAM: MRI THORACIC AND LUMBAR SPINE WITHOUT CONTRAST TECHNIQUE: Multiplanar and multiecho pulse sequences of the thoracic and lumbar spine were obtained without intravenous contrast. COMPARISON:  Comparison made with previous MRI from 05/01/2020. FINDINGS: MRI THORACIC SPINE FINDINGS Alignment: Physiologic with preservation of the normal thoracic kyphosis. Vertebrae: Vertebral body height maintained without acute or chronic fracture. Bone marrow signal intensity somewhat heterogeneous and  diffusely decreased on T1 weighted imaging, nonspecific, but most commonly related to anemia, smoking or obesity. No worrisome osseous lesions. No abnormal marrow edema. Cord: Normal signal and morphology. No epidural hematoma or other collection. Paraspinal and other soft tissues: Paraspinous soft tissues within normal limits. Dependent atelectatic changes noted within the visualized lungs. Complex cystic lesion noted at the anteromedial aspect of the spleen, nonspecific, but possibly related to prior trauma. Disc levels: C7-T1: Central disc protrusion indents the ventral thecal sac, contacting the ventral cord (series 22, image 2). No significant stenosis or cord deformity. Foramina remain patent. T6-7: Right paracentral disc protrusion indents the right ventral thecal sac, contacting and mildly flattening the ventral cord (series 22, image 20). No cord signal changes or significant stenosis. Foramina remain patent. T7-8: Mild left eccentric disc bulge.  No significant stenosis. No other significant disc pathology seen within the thoracic spine. No significant spinal stenosis. Foramina remain patent. MRI LUMBAR SPINE FINDINGS Segmentation: Standard. Lowest well-formed disc space labeled the L5-S1 level. Alignment: Physiologic with preservation of the normal lumbar lordosis. No listhesis. Vertebrae: Vertebral body height maintained without acute or interval fracture. Bone marrow signal intensity mildly heterogeneous and diffusely decreased on T1 weighted imaging, nonspecific, but most commonly related to anemia, smoking, or obesity. Few small benign hemangiomata noted. No worrisome osseous lesions. No abnormal marrow edema. Conus medullaris and cauda equina: Conus extends to the T12-L1 level. Conus and cauda equina appear normal. Paraspinal and other soft tissues: Paraspinous soft tissues within normal limits. Visualized visceral structures are normal. Disc levels: L1-2:  Unremarkable. L2-3: Minimal left eccentric  disc bulge. No spinal stenosis. Foramina remain patent. L3-4: Mild disc bulge. Superimposed small biforaminal disc protrusions closely approximate and/or contacts both of the exiting L3 nerve roots as they course out of the neural foramina (series 4, image 23), slightly greater on the right. Minimal facet hypertrophy. No spinal stenosis. Foramina remain patent. L4-5: Disc bulge with disc desiccation. Superimposed left foraminal disc protrusion with annular fissure, closely approximating and/or contacting the exiting left L4 nerve root (series 4, image 27). Mild narrowing of the left lateral recess with mild left foraminal stenosis. Right neural foramen remains patent as does the central canal. L5-S1:  Unremarkable. IMPRESSION: MR THORACIC SPINE IMPRESSION: 1. Normal MRI appearance of the thoracic spinal cord. No significant spinal stenosis or findings to explain patient's symptoms. 2. Small disc protrusions at C7-T1 and T6-7 without significant stenosis.  3. 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 Finding is indeterminate, but could be related to remote trauma or other insult. Further evaluation with dedicated cross-sectional imaging of the abdomen and pelvis suggested for complete characterization. MRI LUMBAR SPINE IMPRESSION: 1. Normal MRI appearance of the conus medullaris and cauda equina. No high-grade spinal stenosis or findings to explain patient's symptoms. 2. Left foraminal disc protrusion with annular fissure at L4-5, potentially affecting the exiting left L4 nerve root. 3. Small biforaminal disc protrusions at L3-4. Either of the exiting L3 nerve roots could be affected. Electronically Signed   By: Rise Mu M.D.   On: 10/30/2020 20:36        Scheduled Meds:  aspirin EC  81 mg Oral Daily   cholecalciferol  1,000 Units Oral Daily   levothyroxine  12.5 mcg Oral QAC breakfast   methocarbamol  500 mg Oral Q6H   rosuvastatin  40 mg Oral QHS    senna-docusate  1 tablet Oral BID   Continuous Infusions:   LOS: 1 day    Time spent: 30 mins     Charise Killian, MD [facility] Pager [phone]  If 7PM-7AM, please contact night-coverage 11/01/2020, 7:31 AM

## 2020-11-01 NOTE — Evaluation (Signed)
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 Pt underwent C4/5 ACDF on 9/19 c Dr. Lucy Chris. Evening after surgery RR called due to elevated HR 130s, increased RR, heightened anxiety. Neurology consulted, MRI obtained without clear correlation, seziure suspected to be secondary to [facility]. Neurosurgical team also following.   Clinical Impression   Pt seen for OT evaluation this date in setting of acute hospitalization d/t seizure. Pt presents this date feeling that her symptoms are resolving, does still endorse some neck pain, but reports that she received morphine prior to OT session that is helping and only rates pain 4/10. Pt reports being INDEP at baseline. She presents this date with some mild balance deficits and some decreased ability to I'ly perform LB ADLs requiring MIN A for LB bathing and dressing while seated using OT-lead lateral lean and cross-leg modified techniques during session. Pt able to complete fxl mobility with RW to restroom, sink and then to chair on opposite side of room with CGA initially and progresses to SUPV only, demonstrating good control and balance. OT engages pt in several different aspects of self care ADLs throughout session. Pt with no overt issues or LOB, but is noted to be somewhat unsteady and will likely require f/u HHOT services for safety as well as family SUPV upon d/c.       Recommendations for follow up therapy are one component of a multi-disciplinary discharge planning process, led by the attending physician.  Recommendations may be updated based on patient status, additional functional criteria and insurance authorization.   Follow Up Recommendations  Home health  OT    Equipment Recommendations  3 in 1 bedside commode;Other (comment);Tub/shower seat (AE for LB ADLs including grabber and sock aide)    Recommendations for Other Services       Precautions / Restrictions Precautions Precautions: Fall Restrictions Weight Bearing Restrictions: No Other Position/Activity Restrictions: no brace required per MD order. Pt reports requesting a soft collar for overnight wear to minimize pain/discomfort and suboptimal cervical positioning while asleep.      Mobility Bed Mobility Overal bed mobility: Needs Assistance Bed Mobility: Supine to Sit     Supine to sit: Supervision;HOB elevated     General bed mobility comments: increased time    Transfers Overall transfer level: Needs assistance Equipment used: Rolling walker (2 wheeled) Transfers: Sit to/from Stand Sit to Stand: Min guard;Supervision         General transfer comment: CGA initially, progresses to SUPV.    Balance Overall balance assessment: Mild deficits observed, not formally tested                                         ADL either performed or assessed with clinical judgement   ADL Overall ADL's : Needs assistance/impaired     Grooming: Wash/dry face;Oral care;Set up;Supervision/safety;Standing Grooming Details (indicate cue type and reason): sink-side Upper Body Bathing: Set up;Sitting   Lower Body Bathing: Minimal assistance;Sitting/lateral leans Lower Body Bathing Details (indicate cue type and reason): to wash feet in sitting Upper Body Dressing : Set up;Sitting   Lower Body Dressing: Minimal assistance Lower Body  Dressing Details (indicate cue type and reason): to don socks Toilet Transfer: Radiographer, therapeutic Details (indicate cue type and reason): GGA with progress to SUPV Toileting- Clothing Manipulation and Hygiene: Supervision/safety;Sitting/lateral lean       Functional mobility during ADLs: Supervision/safety;Min  guard;Rolling walker (CGA initially. HR noted to increase to 115bpm with fxl mobility to restroom, pt reports feeling fine. Progresses to SUPV with fxl mobiltiy demosntrating good control)       Vision Patient Visual Report: No change from baseline       Perception     Praxis      Pertinent Vitals/Pain Pain Assessment: 0-10 Pain Score: 4  (reports increasing relief as session progresses.) Pain Location: neck pain Pain Descriptors / Indicators: Tender Pain Intervention(s): Monitored during session;Premedicated before session;Repositioned     Hand Dominance     Extremity/Trunk Assessment Upper Extremity Assessment Upper Extremity Assessment: Generalized weakness;Overall [facility] for tasks assessed (ROM WFL, some limited shld movement 2/2 pain. MMT of shld not assessed. Elbow and grip MMT grossly 4/5)   Lower Extremity Assessment Lower Extremity Assessment: Generalized weakness;Overall [facility] for tasks assessed (ROM WFL. Some limitations with LB ADLs d/t body habitus, but pt is ultimately able to complete the tasks)       Communication Communication Communication: No difficulties   Cognition Arousal/Alertness: Awake/alert Behavior During Therapy: WFL for tasks assessed/performed Overall Cognitive Status: Within Functional Limits for tasks assessed                                     General Comments       Exercises Other Exercises Other Exercises: OT ed with pt re: safety considerations including safe use of RW, not turning suddenly (slow, smooth neck movements for navigating envirionment). Pt with good udnerstanding.   Shoulder Instructions      Home Living Family/patient expects to be discharged to:: Private residence Living Arrangements: Children Available Help at Discharge: Available 24 hours/day;Family Type of Home: Apartment Home Access: Stairs to enter [employer] of Steps: ramp and partial step at doorway   Home Layout: One level      Bathroom Shower/Tub: Chief Strategy Officer: Standard     Home Equipment: None          Prior Functioning/Environment Level of Independence: Independent        Comments: Pt endorses fall this past Sunday while getting in/out of shower.        OT Problem List: Decreased strength;Pain;Decreased range of motion;Decreased knowledge of use of DME or AE;Decreased knowledge of precautions;Decreased activity tolerance;Cardiopulmonary status limiting activity      OT Treatment/Interventions: Self-care/ADL training;Therapeutic activities;Therapeutic exercise;DME and/or AE instruction;Patient/family education    OT Goals(Current goals can be found in the care plan section) Acute Rehab OT Goals Patient Stated Goal: minimize pain and get better OT Goal Formulation: With patient/family Time For Goal Achievement: 11/15/20 Potential to Achieve Goals: Good ADL Goals Pt Will Perform Lower Body Dressing: with adaptive equipment;sit to/from stand;with modified independence Pt Will Transfer to Toilet: with modified independence;ambulating Pt Will Perform Toileting - Clothing Manipulation and hygiene: with modified independence;sit to/from stand  OT Frequency: Min 2X/week   Barriers to D/C:            Co-evaluation              AM-PAC OT "6 Clicks" Daily Activity     Outcome Measure Help from another  person eating meals?: None Help from another person taking care of personal grooming?: None Help from another person toileting, which includes using toliet, bedpan, or urinal?: A Little Help from another person bathing (including washing, rinsing, drying)?: A Little Help from another person to put on and taking off regular upper body clothing?: A Little Help from another person to put on and taking off regular lower body clothing?: A Little 6 Click Score: 20   End of Session Equipment Utilized During Treatment: Gait belt;Rolling walker Nurse Communication: Mobility  status  Activity Tolerance: Patient tolerated treatment well Patient left: with call bell/phone within reach;in chair  OT Visit Diagnosis: Other abnormalities of gait and mobility (R26.89);Pain Pain - part of body:  (cervical region)                Time: 1016-1105 OT Time Calculation (min): 49 min Charges:  OT General Charges $OT Visit: 1 Visit OT Evaluation $OT Eval Moderate Complexity: 1 Mod OT Treatments $Self Care/Home Management : 8-22 mins $Therapeutic Activity: 8-22 mins  Rejeana Brock, MS, OTR/L ascom [phone] 11/01/20, 1:23 PM

## 2020-11-01 NOTE — Progress Notes (Signed)
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 Pt underwent C4/5 ACDF on 9/19 c Dr. Lucy Chris. Evening after surgery RR called due to elevated HR 130s, increased RR, heightened anxiety. Neurology consulted, MRI obtained without clear correlation, seziure suspected to be secondary to [facility]. Neurosurgical team also following.    PT Comments    Pt in bed resting at entry, was in chair much of morning, also gave self bath with OT in AM. Pt generally still bradykinetic, strong, confident, but appears guarded- reports due to tremulous feelings in legs. Pt has no LOB in session, needs no physical assist. HR much better controlled, remains in low 110s throughout, unlike previous day wherein standing correlated with immediate jump to 120s, AMB in 130s bpm. Pt able to make 177ft AMB with RW, incredibly slow, particularly given her performance on POD1, however she endorses confidence in her capacity to navigate the home environment with independence and safety upon DC. Will update frequency to 2x/week, continue to AMB with NSG, will continue to follow.      Recommendations for follow up therapy are one component of a multi-disciplinary discharge planning process, led by the attending physician.  Recommendations may be updated based on patient status, additional functional criteria and insurance authorization.  Follow Up Recommendations  Follow surgeon's recommendation for DC plan and follow-up therapies;Supervision for mobility/OOB;Home health PT;Other (comment) (Pt unsure if she wants PT at home, but has marked changes in gross mobility since Tuesday)     Equipment Recommendations  Rolling walker with 5" wheels     Recommendations for Other Services       Precautions / Restrictions Precautions Precautions: Fall Restrictions Weight Bearing Restrictions: No Other Position/Activity Restrictions: no brace required per MD order. Pt reports requesting a soft collar for overnight wear to minimize pain/discomfort and suboptimal cervical positioning while asleep.     Mobility  Bed Mobility Overal bed mobility: Independent Bed Mobility: Supine to Sit     Supine to sit: Supervision;HOB elevated     General bed mobility comments: movign fairly well, slow, but strong    Transfers Overall transfer level: Needs assistance Equipment used: Rolling walker (2 wheeled) Transfers: Sit to/from Stand Sit to Stand: Supervision;Modified independent (Device/Increase time)         General transfer comment: No LOB, strength intact, slow but confident; HR remains ~112bpm or less  Ambulation/Gait   Gait Distance (Feet): 100 Feet Assistive device: Rolling walker (2 wheeled) Gait Pattern/deviations: WFL(Within Functional Limits) Gait velocity: 0.33m/s today, very slow due to perceived tremulous legs (0.65m/s for ~443ft 5 days prior)   General Gait Details: HR remains in 110s, improved from 130s yesterday;   Stairs             Wheelchair Mobility    Modified Rankin (Stroke Patients Only)       Balance Overall balance assessment: Modified Independent                                          Cognition Arousal/Alertness: Awake/alert Behavior During Therapy: WFL for tasks assessed/performed Overall Cognitive Status: Within Functional Limits for tasks assessed  Exercises Other Exercises Other Exercises: OT ed with pt re: safety considerations including safe use of RW, not turning suddenly (slow, smooth neck movements for navigating envirionment). Pt with good udnerstanding.    General Comments        Pertinent  Vitals/Pain Pain Assessment: No/denies pain Pain Score: 7  Pain Location: neck pain Pain Descriptors / Indicators: Operative site guarding Pain Intervention(s): Monitored during session;Premedicated before session;Repositioned    Home Living Family/patient expects to be discharged to:: Private residence Living Arrangements: Children Available Help at Discharge: Available 24 hours/day;Family Type of Home: Apartment Home Access: Stairs to enter   Home Layout: One level Home Equipment: None      Prior Function Level of Independence: Independent      Comments: Pt endorses fall this past Sunday while getting in/out of shower.   PT Goals (current goals can now be found in the care plan section) Acute Rehab PT Goals Patient Stated Goal: minimize pain and get better PT Goal Formulation: With patient Time For Goal Achievement: 11/11/20 Potential to Achieve Goals: Fair Progress towards PT goals: Progressing toward goals    Frequency    Min 2X/week      PT Plan Current plan remains appropriate;Frequency needs to be updated;Equipment recommendations need to be updated    Co-evaluation              AM-PAC PT "6 Clicks" Mobility   Outcome Measure  Help needed turning from your back to your side while in a flat bed without using bedrails?: None Help needed moving from lying on your back to sitting on the side of a flat bed without using bedrails?: None Help needed moving to and from a bed to a chair (including a wheelchair)?: A Little Help needed standing up from a chair using your arms (e.g., wheelchair or bedside chair)?: A Little Help needed to walk in hospital room?: A Little Help needed climbing 3-5 steps with a railing? : A Little 6 Click Score: 20    End of Session   Activity Tolerance: Patient tolerated treatment well;No increased pain Patient left: with call bell/phone within reach;with nursing/sitter in room;in bed Nurse Communication: Mobility status PT  Visit Diagnosis: Unsteadiness on feet (R26.81);Difficulty in walking, not elsewhere classified (R26.2)     Time: 2774-1287 PT Time Calculation (min) (ACUTE ONLY): 24 min  Charges:  $Neuromuscular Re-education: 23-37 mins                    2:54 PM, 11/01/20 Rosamaria Lints, PT, DPT Physical Therapist - [facility]  [phone] (ASCOM)    Marquail Bradwell C 11/01/2020, 2:50 PM

## 2020-11-02 ENCOUNTER — Other Ambulatory Visit: Payer: Self-pay

## 2020-11-02 DIAGNOSIS — R519 Headache, unspecified: Secondary | ICD-10-CM

## 2020-11-02 LAB — BASIC METABOLIC PANEL
Anion gap: 8 (ref 5–15)
BUN: 12 mg/dL (ref 6–20)
CO2: 26 mmol/L (ref 22–32)
Calcium: 9.1 mg/dL (ref 8.9–10.3)
Chloride: 101 mmol/L (ref 98–111)
Creatinine, Ser: 0.98 mg/dL (ref 0.44–1.00)
GFR, Estimated: >60 mL/min (ref >60)
Glucose, Bld: 132 mg/dL — ABNORMAL HIGH (ref 70–99)
Potassium: 3.9 mmol/L (ref 3.5–5.1)
Sodium: 135 mmol/L (ref 135–145)

## 2020-11-02 LAB — CBC
HCT: 41.6 % (ref 36.0–46.0)
Hemoglobin: 14.2 g/dL (ref 12.0–15.0)
MCH: 28.7 pg (ref 26.0–34.0)
MCHC: 34.1 g/dL (ref 30.0–36.0)
MCV: 84 fL (ref 80.0–100.0)
Platelets: 240 10*3/uL (ref 150–400)
RBC: 4.95 MIL/uL (ref 3.87–5.11)
RDW: 13.8 % (ref 11.5–15.5)
WBC: 5.8 10*3/uL (ref 4.0–10.5)
nRBC: 0 % (ref 0.0–0.2)

## 2020-11-02 LAB — TROPONIN I (HIGH SENSITIVITY)
Troponin I (High Sensitivity): 3 ng/L (ref <18)
Troponin I (High Sensitivity): 4 ng/L (ref <18)

## 2020-11-02 LAB — GLUCOSE, CAPILLARY
Glucose-Capillary: 110 mg/dL — ABNORMAL HIGH (ref 70–99)
Glucose-Capillary: 127 mg/dL — ABNORMAL HIGH (ref 70–99)

## 2020-11-02 MED ORDER — SODIUM CHLORIDE 0.9% FLUSH: 10.0000 mL | INTRAVENOUS | Status: DC | PRN

## 2020-11-02 MED ORDER — OXYCODONE-ACETAMINOPHEN 5-325 MG PO TABS
2.0000 | ORAL_TABLET | Freq: Four times a day (QID) | ORAL | Status: DC | PRN
  Administered 2020-11-02: 2 via ORAL
  Filled 2020-11-02: qty 2

## 2020-11-02 MED ORDER — OXYCODONE-ACETAMINOPHEN 10-325 MG PO TABS: 1.0000 | ORAL_TABLET | Freq: Four times a day (QID) | ORAL | 0 refills | Status: AC | PRN

## 2020-11-02 MED ORDER — SODIUM CHLORIDE 0.9% FLUSH
10.0000 mL | Freq: Two times a day (BID) | INTRAVENOUS | Status: DC
  Administered 2020-11-02: 10 mL

## 2020-11-02 MED ORDER — METOPROLOL TARTRATE 5 MG/5ML IV SOLN
2.5000 mg | Freq: Once | INTRAVENOUS | Status: DC
  Filled 2020-11-02: qty 5

## 2020-11-02 MED ORDER — NITROGLYCERIN 0.4 MG SL SUBL: 0.4000 mg | SUBLINGUAL_TABLET | SUBLINGUAL | Status: DC | PRN

## 2020-11-02 MED ORDER — LABETALOL HCL 5 MG/ML IV SOLN
20.0000 mg | INTRAVENOUS | Status: DC | PRN
  Filled 2020-11-02: qty 4

## 2020-11-02 MED ORDER — MORPHINE SULFATE (PF) 2 MG/ML IV SOLN
2.0000 mg | INTRAVENOUS | Status: DC | PRN
  Administered 2020-11-02: 2 mg via INTRAVENOUS
  Filled 2020-11-02: qty 1

## 2020-11-02 NOTE — Discharge Summary (Signed)
Physician Discharge Summary  PUALANI BOGGESS PYY:[id number] DOB: 07-Oct-1970 DOA: 10/29/2020  PCP: Margaretann Loveless, MD  Admit date: 10/29/2020 Discharge date: 11/02/2020  Admitted From: home  Disposition:  home w/ home health   Recommendations for Outpatient Follow-up:  Follow up with PCP in 1-2 weeks F/u w/ neuro surg, Dr. Adriana Simas, in 1-2 weeks F/u w/ neuro, Dr. Malvin Johns, in 2 weeks prn if pt is still having headaches  Home Health: yes Equipment/Devices:  Discharge Condition: stable CODE STATUS:full  Diet recommendation: dysphagia III- heart healthy  Brief/Interim Summary: HPI was taken from Dr. Clyde Lundborg: Laurence Ferrari DANYELA NALLEY is a 50 y.o. female with medical history significant of hyperlipidemia, hypothyroidism, anxiety, OSA on CPAP, obesity, cervical myelopathy (s/p of C4-5 ACDF), who present with seizure.   Pt was recently hospitalized from 9/19-9/20 due to cervical myelopathy. Pt underwent C4-5 ACDF by Dr. Adriana Simas of neurosurgery. Pt is taking Robaxin and Ultram after surgery. Per her daughter, pt was noted to have seizure like activity in the early morning at about 3:00 AM, described as "fluttering eyes and jerking movements". The episode lasted for about 5 minutes.  At that time.  Patient was not talking or answering questions. She reportedly had a similar episode previously while she was in the hospital involving some shaking behaviors and altered mental status with inability to speak. When I saw pt in ED, she is confused, slightly nodding her head when asked for question, slightly moves her extremities.  No facial droop.  Initially patient did not have chest pain, coughing or shortness breath, but later on pt seems to have developed some chest pain on the right side and mild shortness of breath.  No cough.  Temperature normal.  Patient does not have active nausea, vomiting or abdominal pain.  Patient is constipated per her daughter.  Pt has some pain in the surgical site of her neck per her daughter.   ED  Course: pt was found to have WBC 8.1, troponin level 5, UDS positive for benzo, negative pregnancy test, Tylenol level less than 7, salicylate level less than 10, negative COVID PCR, GFR > 60, temperature normal, blood pressure 132/84, heart rate 86, RR 21, oxygen saturation 99% on room air.  CT of head is negative for acute intracranial abnormalities.  Patient is placed on progressive bed for observation.   CTA of chest: Negative for significant acute central or proximal hilar pulmonary embolus. Limited assessment of the peripheral segmental branches for small PE evaluation.   Dense bibasilar collapse/consolidation and additional areas of right middle lobe and lingula atelectasis.   Hepatic steatosis   Indeterminate left upper quadrant 5 cm complex septated cystic lesion with macroscopic fat and central calcifications measuring up to 5 cm.  Left upper quadrant subdiaphragmatic low-attenuation complex cystic lesion with macroscopic fat and septal calcifications this appears to originate from the splenic upper pole. This measures 5 cm and has a indolent chronic appearance. Recommend follow-up dedicated abdomen CT without and with contrast in 3 months as an outpatient.     CT-C spin: 1. No evidence for cervical spine fracture. 2. Status post ACDF at C4-5. 3. Diffuse prevertebral soft tissue edema with a few foci of gas within the retropharyngeal and prevertebral soft tissues compatible with recent ACDF.   Hospital course from Dr. Mayford Knife 9/22-9/25/22: Pt presented w/ possibly seizure like activity. Neuro and neuro surg were consulted. EEG was done and was neg for any seizure activity. Pt's home does of tramadol was d/c indefinitely as tramadol can  decrease seizure threshold. No seizure medications were indicated and it was unlikely pt even had a seizure as per neuro. PT/OT evaluated the pt and recommended HH. HH was set up by CM. Of note, pt c/o intermittent headaches. Neuro work did not  reveal any etiology for the seizure like activity or headaches. Fiorcet was tried but pt did not like the ADRs. Pt can f/u outpatient w/ neuro if pt continues to have headaches. Pt verbalized her understanding. For more information, please see previous progress/consult notes  Discharge Diagnoses:  Principal Problem:   Seizure (HCC) Active Problems:   Cervical myelopathy (HCC)   HLD (hyperlipidemia)   Hypothyroidism   OSA on CPAP   Chest pain   Neck pain  Unlikely seizure: CT head negative for acute intracranial abnormalities. MRI brain shows few small foci of T2 & flair signal w/in right frontal white matter & at gray-white junction. EEG shows no seizure activity. PT recs home health.  No seizures meds indicated as per neuro. D/c tramadol. Neuro signed off.   Hx of cervical stenosis & myelopathy: s/p C4-5 ADCF on 10/27/20 as per neuro surg. MRI T&L spine results as per neuro surg. PT recs home health. Neuro surg signed off   Headaches: etiology unclear, possible referred pain from recent neck surgery vs tension vs migraines. Did not like the ADRs of fioricet. Will f/u outpatient w/ neuro   Dysphagia: continue on dysphagia III diet as per speech    Pre-DM: HbA1c 6.3. Would benefit from significant weight loss   Thrombocytopenia: resolved    HLD: continue on statin    Hypothyroidism: continue on home dose of sythroid    OSA: CPAP qhs    Chest pain: etiology unclear, possibly MSK.  CT angiogram of chest is limited study, but is negative for central PE. Troponins are not elevated. Resolved    Splenic lesion: CTA showed  a left upper quadrant subdiaphragmatic low-attenuation complex cystic lesion with macroscopic fat and septal calcifications this appears to originate from the splenic upper pole. Measures 5 cm and has a indolent chronic appearance. Needs to f/u with PCP with dedicated abdomen CT without and with contrast in 3 months as an outpatient.   Morbid obesity: BMI 48.4.  Complicates overall care & prognosis   Discharge Instructions  Discharge Instructions     Diet - low sodium heart healthy   Complete by: As directed    Dysphagia III diet, chop meats whenever eating them   Discharge instructions   Complete by: As directed    F/u w/ PCP in 1-2 weeks. F/u w/ neuro surg, Dr. Adriana Simas, in 1-2 weeks. F/u w/ neuro, Dr. Malvin Johns, or any neurologist of your choice in 1-2 weeks if you continue to have headaches   Increase activity slowly   Complete by: As directed    No wound care   Complete by: As directed       Allergies as of 11/02/2020   No Known Allergies      Medication List     STOP taking these medications    traMADol 50 MG tablet Commonly known as: ULTRAM       TAKE these medications    cholecalciferol 25 MCG (1000 UNIT) tablet Commonly known as: VITAMIN D3 Take 1,000 Units by mouth daily.   levothyroxine 25 MCG tablet Commonly known as: SYNTHROID Take 12.5 mcg by mouth daily before breakfast.   methocarbamol 500 MG tablet Commonly known as: ROBAXIN Take 1 tablet (500 mg total) by mouth every  6 (six) hours.   NON FORMULARY Pt uses a cpap nightly   oxyCODONE-acetaminophen 10-325 MG tablet Commonly known as: Percocet Take 1 tablet by mouth every 6 (six) hours as needed for up to 5 days for pain.   rosuvastatin 40 MG tablet Commonly known as: CRESTOR Take 40 mg by mouth at bedtime.   senna 8.6 MG Tabs tablet Commonly known as: SENOKOT Take 1 tablet (8.6 mg total) by mouth 2 (two) times daily.               Durable Medical Equipment  (From admission, onward)           Start     Ordered   11/02/20 0843  For home use only DME Walker rolling  Once       Question Answer Comment  Walker: With 5 Inch Wheels   Patient needs a walker to treat with the following condition Generalized weakness      11/02/20 0842            No Known Allergies  Consultations: Neuro Neuro surg    Procedures/Studies: DG  Cervical Spine 2 or 3 views  Result Date: 10/27/2020 CLINICAL DATA:  C4-5 ACDF EXAM: CERVICAL SPINE - 2-3 VIEW; DG C-ARM 1-60 MIN COMPARISON:  09/12/2020 FINDINGS: Single lateral intraoperative fluoroscopic image the cervical spine demonstrates anterior cervical fusion hardware at the C4-5 level. Endotracheal tube is present. 15 seconds of fluoroscopy time was utilized. Please refer to performing physicians operative note for further detail. IMPRESSION: As above. Electronically Signed   By: Duanne Guess D.O.   On: 10/27/2020 15:18   CT HEAD WO CONTRAST  Result Date: 10/29/2020 CLINICAL DATA:  Seizure. EXAM: CT HEAD WITHOUT CONTRAST TECHNIQUE: Contiguous axial images were obtained from the base of the skull through the vertex without intravenous contrast. COMPARISON:  None. FINDINGS: Brain: No evidence of acute infarction, hemorrhage, hydrocephalus, extra-axial collection or mass lesion/mass effect. Vascular: No hyperdense vessel or unexpected calcification. Skull: Normal. Negative for fracture or focal lesion. Sinuses/Orbits: Retention cyst versus polyp noted in the right maxillary sinus. Other: None. IMPRESSION: 1. No acute intracranial abnormalities. Normal brain. 2. Retention cyst versus polyp in the right maxillary sinus. Electronically Signed   By: Signa Kell M.D.   On: 10/29/2020 07:38   CT Angio Chest Pulmonary Embolism (PE) W or WO Contrast  Result Date: 10/29/2020 CLINICAL DATA:  Acute chest pain shortness of breath, recent cervical spine fusion 2 days ago EXAM: CT ANGIOGRAPHY CHEST WITH CONTRAST TECHNIQUE: Multidetector CT imaging of the chest was performed using the standard protocol during bolus administration of intravenous contrast. Multiplanar CT image reconstructions and MIPs were obtained to evaluate the vascular anatomy. CONTRAST:  65mL OMNIPAQUE IOHEXOL 350 MG/ML SOLN COMPARISON:  None. FINDINGS: Cardiovascular: Central and proximal hilar pulmonary vasculature appear patent  without significant acute filling defect or pulmonary embolus. Limited assessment of the lower lobe peripheral segmental branches because of low lung volumes and bibasilar collapse/consolidation. Difficult to exclude small peripheral PE. Normal heart size. Intact thoracic aorta. Negative for aneurysm or dissection. No mediastinal hemorrhage or hematoma. Central venous structures appear patent. No veno-occlusive process. Mediastinum/Nodes: No enlarged mediastinal, hilar, or axillary lymph nodes. Thyroid gland, trachea, and esophagus demonstrate no significant findings. Lungs/Pleura: Low lung volumes with bibasilar atelectasis/consolidation. Difficult to exclude basilar pneumonia. Lingula and right middle lobe atelectasis also noted. No large effusion, pleural abnormality or pneumothorax. Trachea and central airways appear patent. Upper Abdomen: Diffuse hepatic steatosis noted of the imaged portion of  the liver. No biliary obstruction pattern. Left upper quadrant subdiaphragmatic low-attenuation complex cystic lesion with macroscopic fat and septal calcifications this appears to originate from the splenic upper pole. This measures 5 cm and has a indolent chronic appearance. Recommend follow-up dedicated abdomen CT without and with contrast in 3 months as an outpatient. Musculoskeletal: No acute osseous finding. Minor endplate degenerative changes. No compression fracture. Sternum intact. Review of the MIP images confirms the above findings. IMPRESSION: Negative for significant acute central or proximal hilar pulmonary embolus. Limited assessment of the peripheral segmental branches for small PE evaluation. Dense bibasilar collapse/consolidation and additional areas of right middle lobe and lingula atelectasis. Hepatic steatosis Indeterminate left upper quadrant 5 cm complex septated cystic lesion with macroscopic fat and central calcifications measuring up to 5 cm. See above comment and recommendation. Electronically  Signed   By: Judie Petit.  Shick M.D.   On: 10/29/2020 11:49   CT Cervical Spine Wo Contrast  Result Date: 10/29/2020 CLINICAL DATA:  Seizure.  Unresponsive. EXAM: CT CERVICAL SPINE WITHOUT CONTRAST TECHNIQUE: Multidetector CT imaging of the cervical spine was performed without intravenous contrast. Multiplanar CT image reconstructions were also generated. COMPARISON:  None. FINDINGS: Alignment: Normal Skull base and vertebrae: No acute fracture. No primary bone lesion or focal pathologic process. Soft tissues and spinal canal: There is no signs of canal hematoma. Diffuse prevertebral soft tissue edema is identified with a few foci of gas within the retropharyngeal and prevertebral soft tissues compatible with recent ACDF. Disc levels: Status post ACDF at C4-5. stable position of the anterior sideplate and screw device with interbody plug. Degenerative disc disease identified at C5-6 and C6-7. Upper chest: Negative. Other: None. IMPRESSION: 1. No evidence for cervical spine fracture. 2. Status post ACDF at C4-5. 3. Diffuse prevertebral soft tissue edema with a few foci of gas within the retropharyngeal and prevertebral soft tissues compatible with recent ACDF. Electronically Signed   By: Signa Kell M.D.   On: 10/29/2020 09:23   MR BRAIN W WO CONTRAST  Result Date: 10/29/2020 CLINICAL DATA:  Seizure. Abnormal neurological exam. Postictal state. EXAM: MRI HEAD WITHOUT AND WITH CONTRAST TECHNIQUE: Multiplanar, multiecho pulse sequences of the brain and surrounding structures were obtained without and with intravenous contrast. CONTRAST:  51mL GADAVIST GADOBUTROL 1 MMOL/ML IV SOLN COMPARISON:  Head CT same day. FINDINGS: Brain: Diffusion imaging does not show any acute or subacute infarction. No abnormality affects the brainstem or cerebellum. The left cerebral hemispheres normal. In the right frontal lobe, there are a few small foci of T2 and FLAIR signal within the white matter and at the right gray-white junction.  Mesial temporal lobes appear normal on both sides. No evidence of mass, hemorrhage, hydrocephalus or extra-axial collection. Vascular: Major vessels at the base of the brain show flow. Skull and upper cervical spine: Negative Sinuses/Orbits: Retention cyst in the right maxillary sinus. Sinuses otherwise clear. Orbits negative. Other: None IMPRESSION: No acute brain finding. Few small foci of T2 and FLAIR signal within the right frontal white matter and at the gray-white junction. These are often seen in asymptomatic individuals, but could conceivably relate to seizure Electronically Signed   By: Paulina Fusi M.D.   On: 10/29/2020 17:25   MR THORACIC SPINE WO CONTRAST  Result Date: 10/30/2020 CLINICAL DATA:  50 year old female status post recent C4-5 ACDF, now with bilateral lower extremity weakness, loss of sensation with urination. EXAM: MRI THORACIC AND LUMBAR SPINE WITHOUT CONTRAST TECHNIQUE: Multiplanar and multiecho pulse sequences of the thoracic and lumbar  spine were obtained without intravenous contrast. COMPARISON:  Comparison made with previous MRI from 05/01/2020. FINDINGS: MRI THORACIC SPINE FINDINGS Alignment: Physiologic with preservation of the normal thoracic kyphosis. Vertebrae: Vertebral body height maintained without acute or chronic fracture. Bone marrow signal intensity somewhat heterogeneous and diffusely decreased on T1 weighted imaging, nonspecific, but most commonly related to anemia, smoking or obesity. No worrisome osseous lesions. No abnormal marrow edema. Cord: Normal signal and morphology. No epidural hematoma or other collection. Paraspinal and other soft tissues: Paraspinous soft tissues within normal limits. Dependent atelectatic changes noted within the visualized lungs. Complex cystic lesion noted at the anteromedial aspect of the spleen, nonspecific, but possibly related to prior trauma. Disc levels: C7-T1: Central disc protrusion indents the ventral thecal sac, contacting the  ventral cord (series 22, image 2). No significant stenosis or cord deformity. Foramina remain patent. T6-7: Right paracentral disc protrusion indents the right ventral thecal sac, contacting and mildly flattening the ventral cord (series 22, image 20). No cord signal changes or significant stenosis. Foramina remain patent. T7-8: Mild left eccentric disc bulge.  No significant stenosis. No other significant disc pathology seen within the thoracic spine. No significant spinal stenosis. Foramina remain patent. MRI LUMBAR SPINE FINDINGS Segmentation: Standard. Lowest well-formed disc space labeled the L5-S1 level. Alignment: Physiologic with preservation of the normal lumbar lordosis. No listhesis. Vertebrae: Vertebral body height maintained without acute or interval fracture. Bone marrow signal intensity mildly heterogeneous and diffusely decreased on T1 weighted imaging, nonspecific, but most commonly related to anemia, smoking, or obesity. Few small benign hemangiomata noted. No worrisome osseous lesions. No abnormal marrow edema. Conus medullaris and cauda equina: Conus extends to the T12-L1 level. Conus and cauda equina appear normal. Paraspinal and other soft tissues: Paraspinous soft tissues within normal limits. Visualized visceral structures are normal. Disc levels: L1-2:  Unremarkable. L2-3: Minimal left eccentric disc bulge. No spinal stenosis. Foramina remain patent. L3-4: Mild disc bulge. Superimposed small biforaminal disc protrusions closely approximate and/or contacts both of the exiting L3 nerve roots as they course out of the neural foramina (series 4, image 23), slightly greater on the right. Minimal facet hypertrophy. No spinal stenosis. Foramina remain patent. L4-5: Disc bulge with disc desiccation. Superimposed left foraminal disc protrusion with annular fissure, closely approximating and/or contacting the exiting left L4 nerve root (series 4, image 27). Mild narrowing of the left lateral recess  with mild left foraminal stenosis. Right neural foramen remains patent as does the central canal. L5-S1:  Unremarkable. IMPRESSION: MR THORACIC SPINE IMPRESSION: 1. Normal MRI appearance of the thoracic spinal cord. No significant spinal stenosis or findings to explain patient's symptoms. 2. Small disc protrusions at C7-T1 and T6-7 without significant stenosis. 3. 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 Finding is indeterminate, but could be related to remote trauma or other insult. Further evaluation with dedicated cross-sectional imaging of the abdomen and pelvis suggested for complete characterization. MRI LUMBAR SPINE IMPRESSION: 1. Normal MRI appearance of the conus medullaris and cauda equina. No high-grade spinal stenosis or findings to explain patient's symptoms. 2. Left foraminal disc protrusion with annular fissure at L4-5, potentially affecting the exiting left L4 nerve root. 3. Small biforaminal disc protrusions at L3-4. Either of the exiting L3 nerve roots could be affected. Electronically Signed   By: Rise Mu M.D.   On: 10/30/2020 20:36   MR LUMBAR SPINE WO CONTRAST  Result Date: 10/30/2020 CLINICAL DATA:  50 year old female status post recent C4-5 ACDF, now with  bilateral lower extremity weakness, loss of sensation with urination. EXAM: MRI THORACIC AND LUMBAR SPINE WITHOUT CONTRAST TECHNIQUE: Multiplanar and multiecho pulse sequences of the thoracic and lumbar spine were obtained without intravenous contrast. COMPARISON:  Comparison made with previous MRI from 05/01/2020. FINDINGS: MRI THORACIC SPINE FINDINGS Alignment: Physiologic with preservation of the normal thoracic kyphosis. Vertebrae: Vertebral body height maintained without acute or chronic fracture. Bone marrow signal intensity somewhat heterogeneous and diffusely decreased on T1 weighted imaging, nonspecific, but most commonly related to anemia, smoking or obesity. No worrisome  osseous lesions. No abnormal marrow edema. Cord: Normal signal and morphology. No epidural hematoma or other collection. Paraspinal and other soft tissues: Paraspinous soft tissues within normal limits. Dependent atelectatic changes noted within the visualized lungs. Complex cystic lesion noted at the anteromedial aspect of the spleen, nonspecific, but possibly related to prior trauma. Disc levels: C7-T1: Central disc protrusion indents the ventral thecal sac, contacting the ventral cord (series 22, image 2). No significant stenosis or cord deformity. Foramina remain patent. T6-7: Right paracentral disc protrusion indents the right ventral thecal sac, contacting and mildly flattening the ventral cord (series 22, image 20). No cord signal changes or significant stenosis. Foramina remain patent. T7-8: Mild left eccentric disc bulge.  No significant stenosis. No other significant disc pathology seen within the thoracic spine. No significant spinal stenosis. Foramina remain patent. MRI LUMBAR SPINE FINDINGS Segmentation: Standard. Lowest well-formed disc space labeled the L5-S1 level. Alignment: Physiologic with preservation of the normal lumbar lordosis. No listhesis. Vertebrae: Vertebral body height maintained without acute or interval fracture. Bone marrow signal intensity mildly heterogeneous and diffusely decreased on T1 weighted imaging, nonspecific, but most commonly related to anemia, smoking, or obesity. Few small benign hemangiomata noted. No worrisome osseous lesions. No abnormal marrow edema. Conus medullaris and cauda equina: Conus extends to the T12-L1 level. Conus and cauda equina appear normal. Paraspinal and other soft tissues: Paraspinous soft tissues within normal limits. Visualized visceral structures are normal. Disc levels: L1-2:  Unremarkable. L2-3: Minimal left eccentric disc bulge. No spinal stenosis. Foramina remain patent. L3-4: Mild disc bulge. Superimposed small biforaminal disc protrusions  closely approximate and/or contacts both of the exiting L3 nerve roots as they course out of the neural foramina (series 4, image 23), slightly greater on the right. Minimal facet hypertrophy. No spinal stenosis. Foramina remain patent. L4-5: Disc bulge with disc desiccation. Superimposed left foraminal disc protrusion with annular fissure, closely approximating and/or contacting the exiting left L4 nerve root (series 4, image 27). Mild narrowing of the left lateral recess with mild left foraminal stenosis. Right neural foramen remains patent as does the central canal. L5-S1:  Unremarkable. IMPRESSION: MR THORACIC SPINE IMPRESSION: 1. Normal MRI appearance of the thoracic spinal cord. No significant spinal stenosis or findings to explain patient's symptoms. 2. Small disc protrusions at C7-T1 and T6-7 without significant stenosis. 3. 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 Finding is indeterminate, but could be related to remote trauma or other insult. Further evaluation with dedicated cross-sectional imaging of the abdomen and pelvis suggested for complete characterization. MRI LUMBAR SPINE IMPRESSION: 1. Normal MRI appearance of the conus medullaris and cauda equina. No high-grade spinal stenosis or findings to explain patient's symptoms. 2. Left foraminal disc protrusion with annular fissure at L4-5, potentially affecting the exiting left L4 nerve root. 3. Small biforaminal disc protrusions at L3-4. Either of the exiting L3 nerve roots could be affected. Electronically Signed   By: Janell Quiet.D.  On: 10/30/2020 20:36   EEG adult  Result Date: 10/29/2020 Jefferson Fuel, MD     10/29/2020  2:23 PM Routine EEG Report LAHELA WOODIN is a 50 y.o. female with a history of  who is undergoing an EEG to evaluate for seizures. Report: This EEG was acquired with electrodes placed according to the International 10-20 electrode system (including Fp1, Fp2, F3, F4, C3,  C4, P3, P4, O1, O2, T3, T4, T5, T6, A1, A2, Fz, Cz, Pz). The following electrodes were missing or displaced: none. The occipital dominant rhythm was 9 Hz. This activity is reactive to stimulation. Drowsiness was manifested by background fragmentation; deeper stages of sleep were identified by K complexes and sleep spindles. There was no focal slowing. There were no interictal epileptiform discharges. There were no electrographic seizures identified. There was no abnormal response to photic stimulation or hyperventilation. Impression: This EEG was obtained while awake and asleep and is normal.   Clinical Correlation: Normal EEGs, however, do not rule out epilepsy. Bing Neighbors, MD [facility] [phone] If 7pm- 7am, please page neurology on call as listed in AMION.  DG C-Arm 1-60 Min  Result Date: 10/27/2020 CLINICAL DATA:  C4-5 ACDF EXAM: CERVICAL SPINE - 2-3 VIEW; DG C-ARM 1-60 MIN COMPARISON:  09/12/2020 FINDINGS: Single lateral intraoperative fluoroscopic image the cervical spine demonstrates anterior cervical fusion hardware at the C4-5 level. Endotracheal tube is present. 15 seconds of fluoroscopy time was utilized. Please refer to performing physicians operative note for further detail. IMPRESSION: As above. Electronically Signed   By: Duanne Guess D.O.   On: 10/27/2020 15:18   DG C-Arm 1-60 Min  Result Date: 10/27/2020 CLINICAL DATA:  C4-5 ACDF EXAM: CERVICAL SPINE - 2-3 VIEW; DG C-ARM 1-60 MIN COMPARISON:  09/12/2020 FINDINGS: Single lateral intraoperative fluoroscopic image the cervical spine demonstrates anterior cervical fusion hardware at the C4-5 level. Endotracheal tube is present. 15 seconds of fluoroscopy time was utilized. Please refer to performing physicians operative note for further detail. IMPRESSION: As above. Electronically Signed   By: Duanne Guess D.O.   On: 10/27/2020 15:18   (Echo, Carotid, EGD, Colonoscopy, ERCP)    Subjective: Pt c/o  headache   Discharge Exam: Vitals:   11/02/20 0439 11/02/20 0834  BP: 115/88 114/82  Pulse: 89 95  Resp: 18 18  Temp:  98.3 F (36.8 C)  SpO2: 98% 97%   Vitals:   11/02/20 0119 11/02/20 0200 11/02/20 0439 11/02/20 0834  BP: 135/89 115/82 115/88 114/82  Pulse: (!) 104 98 89 95  Resp: Temp:    98.3 F (36.8 C)  TempSrc:    Oral  SpO2: 96% 99% 98% 97%  Weight:      Height:        General: Pt is alert, awake, not in acute distress. Morbidly obese Cardiovascular: S1/S2 +, no rubs, no gallops Respiratory: CTA bilaterally, no wheezing, no rhonchi Abdominal: Soft, NT,obese, bowel sounds + Extremities: no cyanosis    The results of significant diagnostics from this hospitalization (including imaging, microbiology, ancillary and laboratory) are listed below for reference.     Microbiology: Recent Results (from the past 240 hour(s))  SARS CORONAVIRUS 2 (TAT 6-24 HRS) Nasopharyngeal Nasopharyngeal Swab     Status: None   Collection Time: 10/23/20 12:02 PM   Specimen: Nasopharyngeal Swab  Result Value Ref Range Status   SARS Coronavirus 2 NEGATIVE NEGATIVE Final    Comment: (NOTE) SARS-CoV-2 target nucleic acids are NOT DETECTED.  The  SARS-CoV-2 RNA is generally detectable in upper and lower respiratory specimens during the acute phase of infection. Negative results do not preclude SARS-CoV-2 infection, do not rule out co-infections with other pathogens, and should not be used as the sole basis for treatment or other patient management decisions. Negative results must be combined with clinical observations, patient history, and epidemiological information. The expected result is Negative.  Fact Sheet for Patients: HairSlick.no  Fact Sheet for Healthcare Providers: quierodirigir.com  This test is not yet approved or cleared by the [place] FDA and  has been authorized for detection and/or diagnosis  of SARS-CoV-2 by FDA under an Emergency Use Authorization (EUA). This EUA will remain  in effect (meaning this test can be used) for the duration of the COVID-19 declaration under Se ction 564(b)(1) of the Act, 21 U.S.C. section 360bbb-3(b)(1), unless the authorization is terminated or revoked sooner.  Performed at [facility], [address] [place], [place] [address]   Resp Panel by RT-PCR (Flu A&B, Covid) Nasopharyngeal Swab     Status: None   Collection Time: 10/29/20  7:46 AM   Specimen: Nasopharyngeal Swab; Nasopharyngeal(NP) swabs in vial transport medium  Result Value Ref Range Status   SARS Coronavirus 2 by RT PCR NEGATIVE NEGATIVE Final    Comment: (NOTE) SARS-CoV-2 target nucleic acids are NOT DETECTED.  The SARS-CoV-2 RNA is generally detectable in upper respiratory specimens during the acute phase of infection. The lowest concentration of SARS-CoV-2 viral copies this assay can detect is 138 copies/mL. A negative result does not preclude SARS-Cov-2 infection and should not be used as the sole basis for treatment or other patient management decisions. A negative result may occur with  improper specimen collection/handling, submission of specimen other than nasopharyngeal swab, presence of viral mutation(s) within the areas targeted by this assay, and inadequate number of viral copies(<138 copies/mL). A negative result must be combined with clinical observations, patient history, and epidemiological information. The expected result is Negative.  Fact Sheet for Patients:  BloggerCourse.com  Fact Sheet for Healthcare Providers:  SeriousBroker.it  This test is no t yet approved or cleared by the [place] FDA and  has been authorized for detection and/or diagnosis of SARS-CoV-2 by FDA under an Emergency Use Authorization (EUA). This EUA will remain  in effect (meaning this test can be used) for the duration of  the COVID-19 declaration under Section 564(b)(1) of the Act, 21 U.S.C.section 360bbb-3(b)(1), unless the authorization is terminated  or revoked sooner.       Influenza A by PCR NEGATIVE NEGATIVE Final   Influenza B by PCR NEGATIVE NEGATIVE Final    Comment: (NOTE) The Xpert Xpress SARS-CoV-2/FLU/RSV plus assay is intended as an aid in the diagnosis of influenza from Nasopharyngeal swab specimens and should not be used as a sole basis for treatment. Nasal washings and aspirates are unacceptable for Xpert Xpress SARS-CoV-2/FLU/RSV testing.  Fact Sheet for Patients: BloggerCourse.com  Fact Sheet for Healthcare Providers: SeriousBroker.it  This test is not yet approved or cleared by the [place] FDA and has been authorized for detection and/or diagnosis of SARS-CoV-2 by FDA under an Emergency Use Authorization (EUA). This EUA will remain in effect (meaning this test can be used) for the duration of the COVID-19 declaration under Section 564(b)(1) of the Act, 21 U.S.C. section 360bbb-3(b)(1), unless the authorization is terminated or revoked.  Performed at [facility], [address] [place], [place] [address]   CULTURE, BLOOD (ROUTINE X 2) w Reflex  to ID Panel     Status: None (Preliminary result)   Collection Time: 10/30/20  3:17 PM   Specimen: BLOOD  Result Value Ref Range Status   Specimen Description BLOOD BLOOD RIGHT HAND  Final   Special Requests   Final    BOTTLES DRAWN AEROBIC AND ANAEROBIC Blood Culture results may not be optimal due to an inadequate volume of blood received in culture bottles   Culture   Final    NO GROWTH 3 DAYS Performed at [facility], [address] [place], [place] [address]    Report Status PENDING  Incomplete  CULTURE, BLOOD (ROUTINE X 2) w Reflex to ID Panel     Status: None (Preliminary result)   Collection Time: 10/30/20  3:17 PM   Specimen: BLOOD  Result  Value Ref Range Status   Specimen Description BLOOD BLOOD LEFT HAND  Final   Special Requests   Final    BOTTLES DRAWN AEROBIC AND ANAEROBIC Blood Culture results may not be optimal due to an inadequate volume of blood received in culture bottles   Culture   Final    NO GROWTH 3 DAYS Performed at [facility], [address] [place], [place] [address]    Report Status PENDING  Incomplete     Labs: BNP (last 3 results) No results for input(s): BNP in the last 8760 hours. Basic Metabolic Panel: Recent Labs  Lab 10/29/20 0632 10/30/20 0712 10/31/20 0515 11/01/20 0614 11/02/20 0247  NA 138 135 136 137 135  K 3.8 3.7 4.4 4.2 3.9  CL 102 97* 100 100 101  CO2 GLUCOSE 107* 104* 103* 130* 132*  BUN CREATININE 1.01* 0.81 0.61 0.74 0.98  CALCIUM 9.5 9.1 9.0 9.1 9.1  MG 2.2  --   --   --   --    Liver Function Tests: Recent Labs  Lab 10/29/20 0632  AST 23  ALT 20  ALKPHOS 52  BILITOT 0.6  PROT 7.8  ALBUMIN 4.1   No results for input(s): LIPASE, AMYLASE in the last 168 hours. No results for input(s): AMMONIA in the last 168 hours. CBC: Recent Labs  Lab 10/28/20 0909 10/29/20 0632 10/31/20 0515 11/01/20 0614 11/02/20 0247  WBC 11.0* 8.1 4.9 6.7 5.8  NEUTROABS  --  4.6  --   --   --   HGB 14.8 14.6 13.9 14.3 14.2  HCT 43.7 43.8 43.2 41.9 41.6  MCV 84.9 83.6 89.1 83.8 84.0  PLT 244 234 141* 243 240   Cardiac Enzymes: No results for input(s): CKTOTAL, CKMB, CKMBINDEX, TROPONINI in the last 168 hours. BNP: Invalid input(s): POCBNP CBG: Recent Labs  Lab 10/31/20 0835 11/01/20 0740 11/01/20 1205 11/01/20 2052 11/02/20 0835  GLUCAP 93 108* 166* 141* 110*   D-Dimer No results for input(s): DDIMER in the last 72 hours. Hgb A1c No results for input(s): HGBA1C in the last 72 hours. Lipid Profile No results for input(s): CHOL, HDL, LDLCALC, TRIG, CHOLHDL, LDLDIRECT in the last 72 hours. Thyroid function studies No  results for input(s): TSH, T4TOTAL, T3FREE, THYROIDAB in the last 72 hours.  Invalid input(s): FREET3 Anemia work up No results for input(s): VITAMINB12, FOLATE, FERRITIN, TIBC, IRON, RETICCTPCT in the last 72 hours. Urinalysis No results found for: COLORURINE, APPEARANCEUR, LABSPEC, PHURINE, GLUCOSEU, HGBUR, BILIRUBINUR, KETONESUR, PROTEINUR, UROBILINOGEN, NITRITE, LEUKOCYTESUR Sepsis Labs Invalid input(s): PROCALCITONIN,  WBC,  LACTICIDVEN Microbiology Recent Results (from the past 240  hour(s))  SARS CORONAVIRUS 2 (TAT 6-24 HRS) Nasopharyngeal Nasopharyngeal Swab     Status: None   Collection Time: 10/23/20 12:02 PM   Specimen: Nasopharyngeal Swab  Result Value Ref Range Status   SARS Coronavirus 2 NEGATIVE NEGATIVE Final    Comment: (NOTE) SARS-CoV-2 target nucleic acids are NOT DETECTED.  The SARS-CoV-2 RNA is generally detectable in upper and lower respiratory specimens during the acute phase of infection. Negative results do not preclude SARS-CoV-2 infection, do not rule out co-infections with other pathogens, and should not be used as the sole basis for treatment or other patient management decisions. Negative results must be combined with clinical observations, patient history, and epidemiological information. The expected result is Negative.  Fact Sheet for Patients: HairSlick.no  Fact Sheet for Healthcare Providers: quierodirigir.com  This test is not yet approved or cleared by the [place] FDA and  has been authorized for detection and/or diagnosis of SARS-CoV-2 by FDA under an Emergency Use Authorization (EUA). This EUA will remain  in effect (meaning this test can be used) for the duration of the COVID-19 declaration under Se ction 564(b)(1) of the Act, 21 U.S.C. section 360bbb-3(b)(1), unless the authorization is terminated or revoked sooner.  Performed at [facility], [address]  [place], [place] [address]   Resp Panel by RT-PCR (Flu A&B, Covid) Nasopharyngeal Swab     Status: None   Collection Time: 10/29/20  7:46 AM   Specimen: Nasopharyngeal Swab; Nasopharyngeal(NP) swabs in vial transport medium  Result Value Ref Range Status   SARS Coronavirus 2 by RT PCR NEGATIVE NEGATIVE Final    Comment: (NOTE) SARS-CoV-2 target nucleic acids are NOT DETECTED.  The SARS-CoV-2 RNA is generally detectable in upper respiratory specimens during the acute phase of infection. The lowest concentration of SARS-CoV-2 viral copies this assay can detect is 138 copies/mL. A negative result does not preclude SARS-Cov-2 infection and should not be used as the sole basis for treatment or other patient management decisions. A negative result may occur with  improper specimen collection/handling, submission of specimen other than nasopharyngeal swab, presence of viral mutation(s) within the areas targeted by this assay, and inadequate number of viral copies(<138 copies/mL). A negative result must be combined with clinical observations, patient history, and epidemiological information. The expected result is Negative.  Fact Sheet for Patients:  BloggerCourse.com  Fact Sheet for Healthcare Providers:  SeriousBroker.it  This test is no t yet approved or cleared by the [place] FDA and  has been authorized for detection and/or diagnosis of SARS-CoV-2 by FDA under an Emergency Use Authorization (EUA). This EUA will remain  in effect (meaning this test can be used) for the duration of the COVID-19 declaration under Section 564(b)(1) of the Act, 21 U.S.C.section 360bbb-3(b)(1), unless the authorization is terminated  or revoked sooner.       Influenza A by PCR NEGATIVE NEGATIVE Final   Influenza B by PCR NEGATIVE NEGATIVE Final    Comment: (NOTE) The Xpert Xpress SARS-CoV-2/FLU/RSV plus assay is intended as an aid in the diagnosis of  influenza from Nasopharyngeal swab specimens and should not be used as a sole basis for treatment. Nasal washings and aspirates are unacceptable for Xpert Xpress SARS-CoV-2/FLU/RSV testing.  Fact Sheet for Patients: BloggerCourse.com  Fact Sheet for Healthcare Providers: SeriousBroker.it  This test is not yet approved or cleared by the [place] FDA and has been authorized for detection and/or diagnosis of SARS-CoV-2 by FDA under an Emergency Use Authorization (EUA). This  EUA will remain in effect (meaning this test can be used) for the duration of the COVID-19 declaration under Section 564(b)(1) of the Act, 21 U.S.C. section 360bbb-3(b)(1), unless the authorization is terminated or revoked.  Performed at [facility], [address] [place], [place] [address]   CULTURE, BLOOD (ROUTINE X 2) w Reflex to ID Panel     Status: None (Preliminary result)   Collection Time: 10/30/20  3:17 PM   Specimen: BLOOD  Result Value Ref Range Status   Specimen Description BLOOD BLOOD RIGHT HAND  Final   Special Requests   Final    BOTTLES DRAWN AEROBIC AND ANAEROBIC Blood Culture results may not be optimal due to an inadequate volume of blood received in culture bottles   Culture   Final    NO GROWTH 3 DAYS Performed at [facility], [address], [place], [place] [address]    Report Status PENDING  Incomplete  CULTURE, BLOOD (ROUTINE X 2) w Reflex to ID Panel     Status: None (Preliminary result)   Collection Time: 10/30/20  3:17 PM   Specimen: BLOOD  Result Value Ref Range Status   Specimen Description BLOOD BLOOD LEFT HAND  Final   Special Requests   Final    BOTTLES DRAWN AEROBIC AND ANAEROBIC Blood Culture results may not be optimal due to an inadequate volume of blood received in culture bottles   Culture   Final    NO GROWTH 3 DAYS Performed at [facility], [address], [place], [place]  [address]    Report Status PENDING  Incomplete     Time coordinating discharge: Over 30 minutes  SIGNED:   Charise Killian, MD  [facility] 11/02/2020, 10:43 AM Pager   If 7PM-7AM, please contact night-coverage

## 2020-11-02 NOTE — Progress Notes (Signed)
SLP follow up Note  Patient Details Name: Leslie Meadows MRN: [id number] DOB: January 27, 1971   Cancelled treatment:       Reason Eval/Treat Not Completed: Other (comment)  Pt seen for diet follow up. Pt's daughter present. Both report that current diet is well tolerated with no further upgrade sought at this time. Breakfast tray present with 100% consumed. ST to sign off at this time.   Keyauna Graefe B. Dreama Saa M.S., CCC-SLP, [facility] Speech-Language Pathologist Rehabilitation Services Office [phone]  Kyngston Pickelsimer Dreama Saa 11/02/2020, 10:14 AM

## 2020-11-02 NOTE — Plan of Care (Signed)

## 2020-11-02 NOTE — Progress Notes (Signed)
Pt woke with CP, unable to speak due to the pain. Spoke with Dr. Arville Care and he ordered one time dose of 2.5mg  IV metoprolol and prn labetalol with parameters. BP at the time was BP 168/109 (127) HR 117.  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 MD ordered labs and patient said the chest pain was gone and now she had an excruciating headache. MD said pt can have Tylenol, but pt did not want Tylenol. She said she was having shortness of breath, because she had taken off her cpap, and he wanted it replaced, but she did not want to use it, sats in mid to high 90s on RA and breathing fine. BP started to trend down and came down on its own as well as the HR. MD said to hold the IV metoprolol. Last BP @ 0200  115/82 (94) HR 98. Took Foricet for headache and midline was placed for better access. Continue to monitor.

## 2020-11-02 NOTE — TOC Transition Note (Addendum)
Transition of Care [facility]) - CM/SW Discharge Note   Patient Details  Name: Leslie Meadows MRN: [id number] Date of Birth: December 26, 1970  Transition of Care [facility]) CM/SW Contact:  Liliana Cline, LCSW Phone Number: 11/02/2020, 1:52 PM   Clinical Narrative:   Patient cleared for DC. Workers Comp unable to deliver RW  to patient today. Patient unsafe to go home without RW. RW provided.    Final next level of care: Home w Home Health Services Barriers to Discharge: Barriers Resolved   Patient Goals and CMS Choice Patient states their goals for this hospitalization and ongoing recovery are:: home with home health - to be arranged by workers compensation department CMS Medicare.gov Compare Post Acute Care list provided to:: Patient Choice offered to / list presented to : Patient  Discharge Placement                  Name of family member notified: patient notified Patient and family notified of of transfer: 11/01/20  Discharge Plan and Services                          HH Arranged: PT, OT          Social Determinants of Health (SDOH) Interventions     Readmission Risk Interventions No flowsheet data found.

## 2020-11-04 LAB — CULTURE, BLOOD (ROUTINE X 2)
Culture: NO GROWTH
Culture: NO GROWTH

## 2020-12-02 ENCOUNTER — Encounter (HOSPITAL_COMMUNITY): Payer: Self-pay | Admitting: Radiology

## 2021-02-17 ENCOUNTER — Emergency Department: Payer: 59

## 2021-02-17 ENCOUNTER — Other Ambulatory Visit: Payer: Self-pay

## 2021-02-17 ENCOUNTER — Emergency Department
Admission: EM | Admit: 2021-02-17 | Discharge: 2021-02-17 | Disposition: A | Discharge status: Home / Self Care | Payer: 59 | Attending: Emergency Medicine | Admitting: Emergency Medicine

## 2021-02-17 DIAGNOSIS — R208 Other disturbances of skin sensation: Secondary | ICD-10-CM | POA: Diagnosis present

## 2021-02-17 DIAGNOSIS — M5416 Radiculopathy, lumbar region: Secondary | ICD-10-CM | POA: Insufficient documentation

## 2021-02-17 DIAGNOSIS — R600 Localized edema: Secondary | ICD-10-CM | POA: Insufficient documentation

## 2021-02-17 DIAGNOSIS — R202 Paresthesia of skin: Secondary | ICD-10-CM | POA: Diagnosis not present

## 2021-02-17 DIAGNOSIS — M79604 Pain in right leg: Secondary | ICD-10-CM | POA: Diagnosis present

## 2021-02-17 IMAGING — US US EXTREM LOW VENOUS*R*
1 series · 14 of 24 positions shown · non-contrast
Comparison: None.

CLINICAL DATA: Right lower extremity swelling.

EXAM:
Right LOWER EXTREMITY VENOUS DOPPLER ULTRASOUND
TECHNIQUE: Gray-scale sonography with compression, as well as color and duplex
ultrasound, were performed to evaluate the deep venous system(s)
from the level of the common femoral vein through the popliteal and
proximal calf veins.

[Series 1: us venous img lower uni right (dvt) · portal-venous · 14 of 35 slices shown]
[im 1/35]
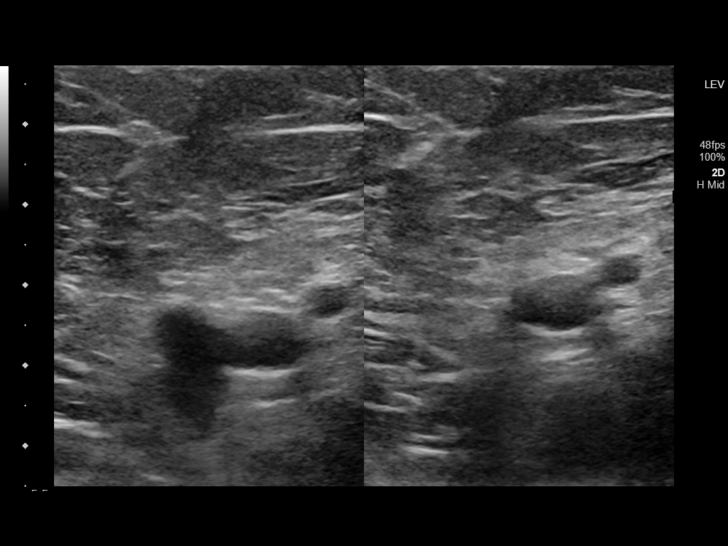
[im 3/35]
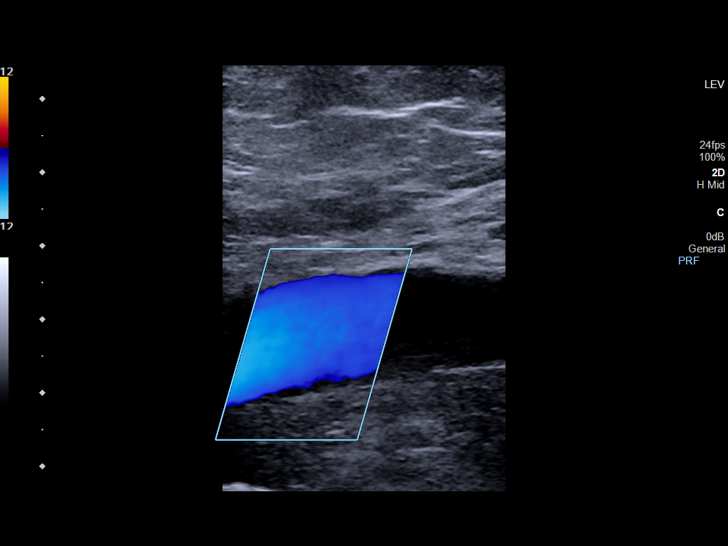
[im 6/35]
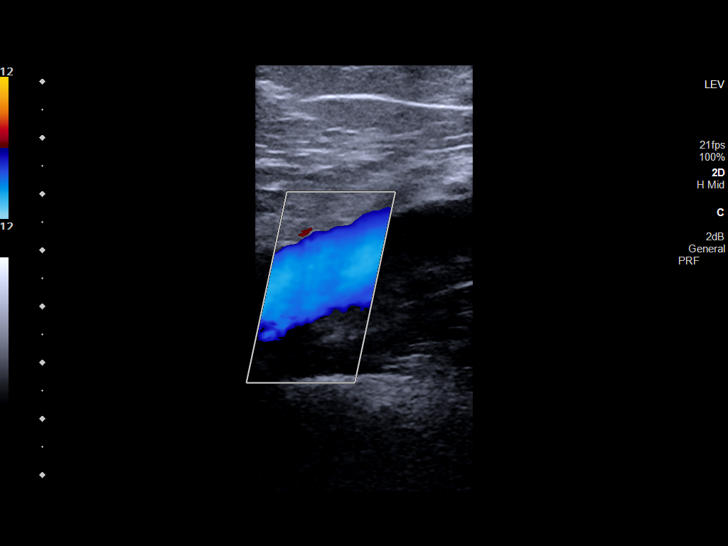
[im 9/35]
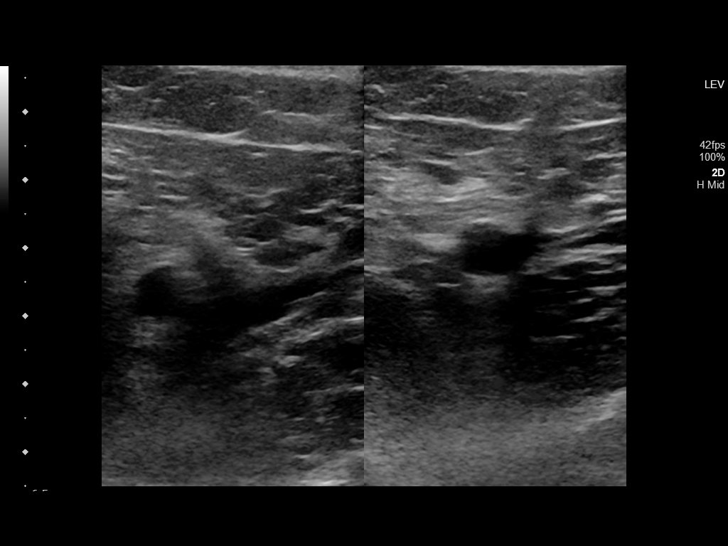
[im 11/35]
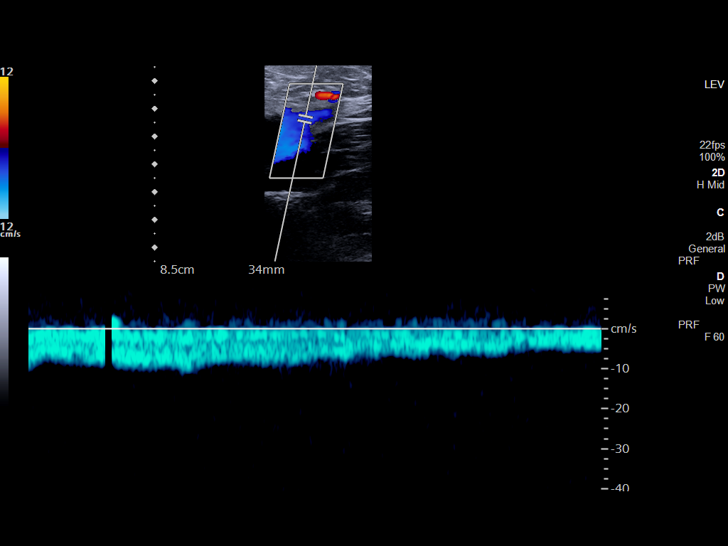
[im 14/35]
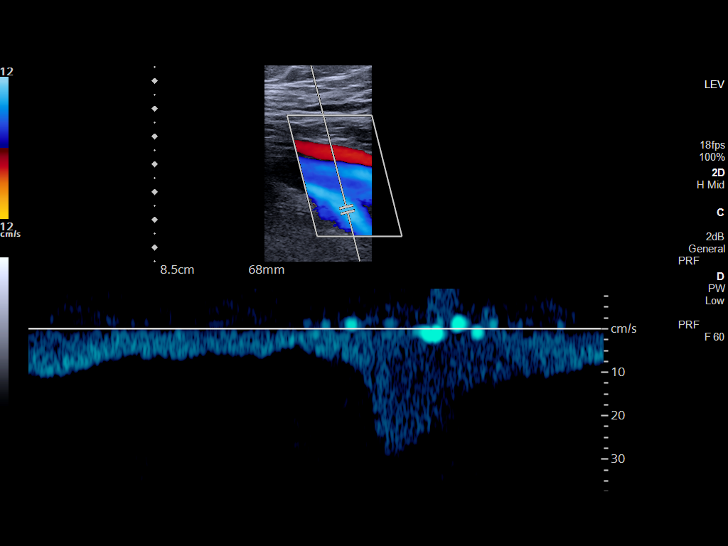
[im 17/35]
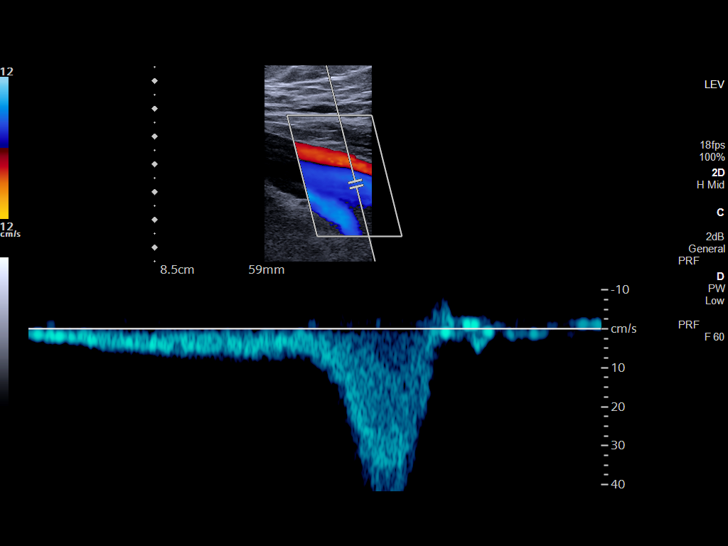
[im 18/35]
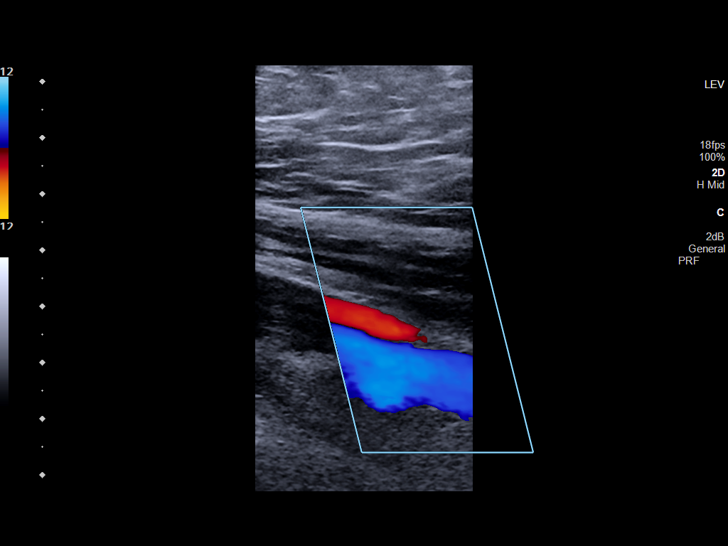
[im 21/35]
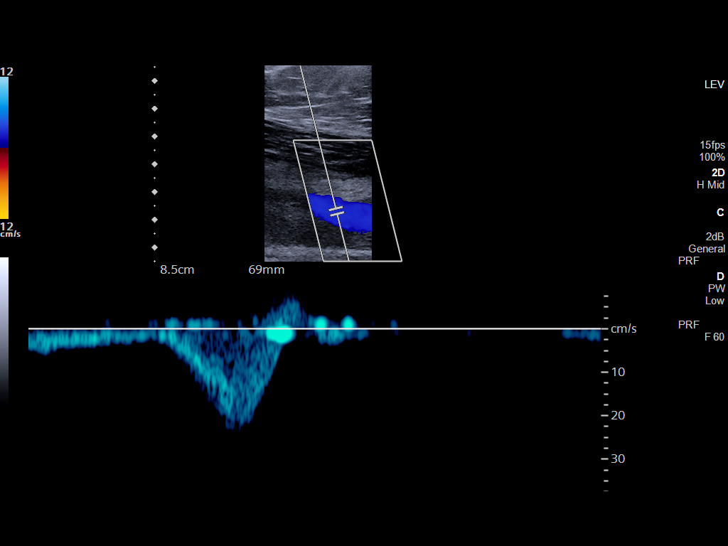
[im 24/35]
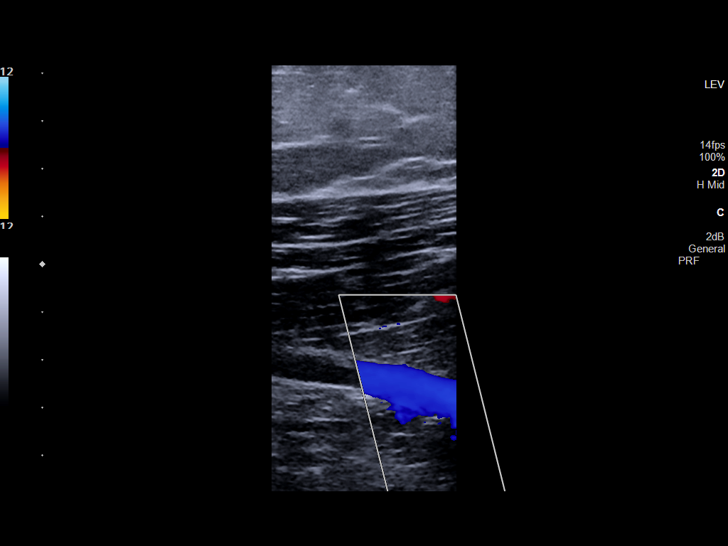
[im 27/35]
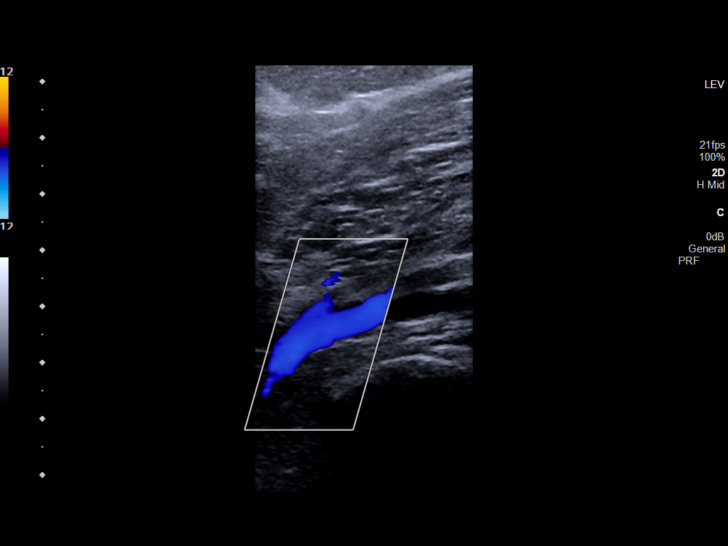
[im 29/35]
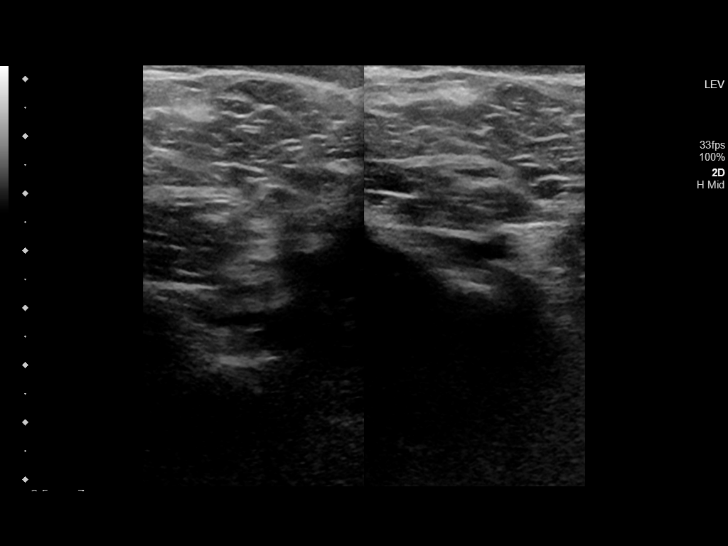
[im 32/35]
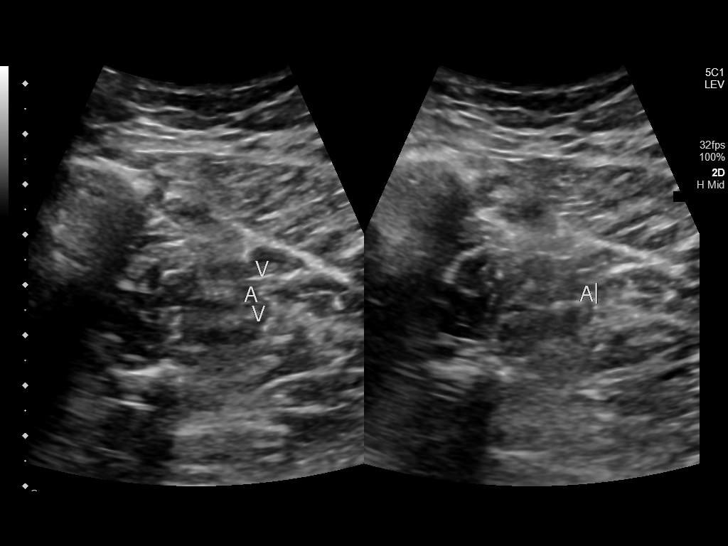
[im 35/35]
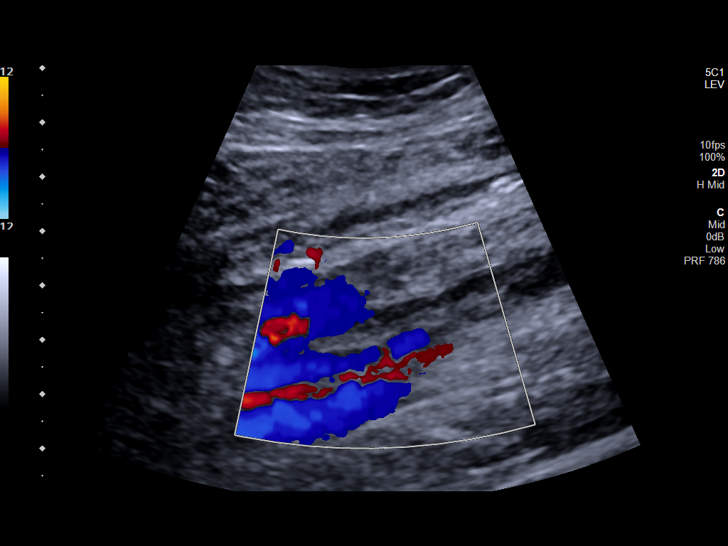

[14 of 24 positions shown; findings below may reference images not displayed]

FINDINGS: VENOUS

Normal compressibility of the common femoral, superficial femoral,
and popliteal veins, as well as the visualized calf veins.
Visualized portions of profunda femoral vein and great saphenous
vein unremarkable. No filling defects to suggest DVT on grayscale or
color Doppler imaging. Doppler waveforms show normal direction of
venous flow, normal respiratory plasticity and response to
augmentation.

Limited views of the contralateral common femoral vein are
unremarkable.

OTHER

None.

Limitations: none
IMPRESSION: Negative.

## 2021-02-17 MED ORDER — PREDNISONE 10 MG (21) PO TBPK: ORAL_TABLET | ORAL | 0 refills | Status: DC

## 2021-02-17 MED ORDER — METHOCARBAMOL 500 MG PO TABS: 500.0000 mg | ORAL_TABLET | Freq: Four times a day (QID) | ORAL | 0 refills | Status: AC

## 2021-02-17 NOTE — Discharge Instructions (Addendum)
Take tapered steroid as directed. You can take Robaxin up to three times daily for muscle spasm.

## 2021-02-17 NOTE — ED Provider Notes (Signed)
[facility] Provider Note  Patient Contact: 7:03 PM (approximate)   History   Numbness   HPI  Leslie Meadows is a 51 y.o. female presents to the emergency department with some sharp burning pain along the lateral aspect of the right lower extremity.  Patient has had a recent surgery on her neck and she was presented emergency department to rule out DVT.  Patient has not noticed any erythema or swelling.  States that pain worsens with a standing position is improved with rest.  No bowel or bladder incontinence or saddle anesthesia.      Physical Exam   Triage Vital Signs: ED Triage Vitals  Enc Vitals Group     BP 02/17/21 1720 (!) 137/109     Pulse Rate 02/17/21 1720 88     Resp 02/17/21 1720 20     Temp 02/17/21 1720 98.2 F (36.8 C)     Temp Source 02/17/21 1720 Oral     SpO2 02/17/21 1720 97 %     Weight 02/17/21 1721 280 lb (127 kg)     Height 02/17/21 1721 5\' 2"  (1.575 m)     Head Circumference --      Peak Flow --      Pain Score 02/17/21 1721 0     Pain Loc --      Pain Edu? --      Excl. in GC? --     Most recent vital signs: Vitals:   02/17/21 1720  BP: (!) 137/109  Pulse: 88  Resp: 20  Temp: 98.2 F (36.8 C)  SpO2: 97%     General: Alert and in no acute distress. Eyes:  PERRL. EOMI. Head: No acute traumatic findings ENT:      Nose: No congestion/rhinnorhea.      Mouth/Throat: Mucous membranes are moist. Neck: No stridor. No cervical spine tenderness to palpation. Cardiovascular:  Good peripheral perfusion Respiratory: Normal respiratory effort without tachypnea or retractions. Lungs CTAB. Good air entry to the bases with no decreased or absent breath sounds. Gastrointestinal: Bowel sounds 4 quadrants. Soft and nontender to palpation. No guarding or rigidity. No palpable masses. No distention. No CVA tenderness. Musculoskeletal: Full range of motion to all extremities.  Neurologic:  No gross focal neurologic deficits are  appreciated.  Skin:   No rash noted Other:   ED Results / Procedures / Treatments   Labs (all labs ordered are listed, but only abnormal results are displayed) Labs Reviewed - No data to display       MEDICATIONS ORDERED IN ED: Medications - No data to display   IMPRESSION / MDM / ASSESSMENT AND PLAN / ED COURSE  I reviewed the triage vital signs and the nursing notes.                              Differential diagnosis includes, but is not limited to, DVT, lumbar radiculopathy, lumbar strain,...  Assessment and plan Lumbar radiculopathy:  51 year old female presents to the emergency department with right lateral thigh pain with a burning and tingling sensation.  Patient was hypertensive at triage but vital signs were otherwise reassuring.  Patient was alert, active and nontoxic-appearing.  Venous ultrasound showed no signs of DVT.  We will treat with tapered prednisone and Robaxin and have patient follow-up with primary care.      FINAL CLINICAL IMPRESSION(S) / ED DIAGNOSES   Final diagnoses:  Paresthesia  Rx / DC Orders   ED Discharge Orders          Ordered    predniSONE (STERAPRED UNI-PAK 21 TAB) 10 MG (21) TBPK tablet        02/17/21 1859    methocarbamol (ROBAXIN) 500 MG tablet  Every 6 hours        02/17/21 1859             Note:  This document was prepared using Dragon voice recognition software and may include unintentional dictation errors.   Pia Mau [place], PA-C 02/17/21 2018    Phineas Semen, MD 02/17/21 2033

## 2021-02-17 NOTE — ED Triage Notes (Signed)
Pt to ED for numbness to right thigh, states noticed when wakening this am.  Able to move. No weakness.   A couple months post op from neck surgery

## 2021-03-06 ENCOUNTER — Other Ambulatory Visit: Payer: Self-pay | Admitting: Neurosurgery

## 2021-03-06 DIAGNOSIS — M4802 Spinal stenosis, cervical region: Secondary | ICD-10-CM

## 2021-03-13 ENCOUNTER — Other Ambulatory Visit: Payer: Self-pay

## 2021-03-13 ENCOUNTER — Ambulatory Visit
Admission: RE | Admit: 2021-03-13 | Discharge: 2021-03-13 | Disposition: A | Discharge status: Home / Self Care | Payer: Worker's Compensation | Source: Ambulatory Visit | Attending: Neurosurgery | Admitting: Neurosurgery

## 2021-03-13 DIAGNOSIS — M4802 Spinal stenosis, cervical region: Secondary | ICD-10-CM | POA: Diagnosis present

## 2021-03-13 IMAGING — MR MR CERVICAL SPINE W/O CM
6 of 7 series · 38 of 48 positions shown · non-contrast
Comparison: MRI of the cervical spine [DATE]. CT of the
cervical spine [DATE]

CLINICAL DATA: Persistent pain and numbness in the left lower
extremity extending into the fourth and fifth digits despite
cervical fusion [DATE]

EXAM:
MRI CERVICAL SPINE WITHOUT CONTRAST
TECHNIQUE: Multiplanar, multisequence MR imaging of the cervical spine was
performed. No intravenous contrast was administered.

[Series 5: T2 · sagittal · 3.0mm · 0.62mm/px · 6 of 15 slices shown (1 of 3)]
[im 1/15]
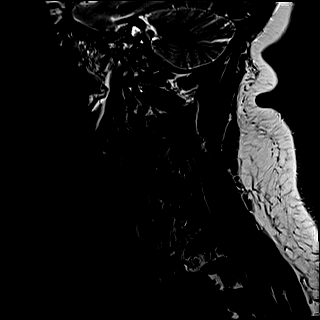
[im 3/15]
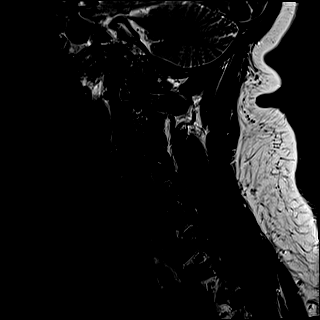
[im 6/15]
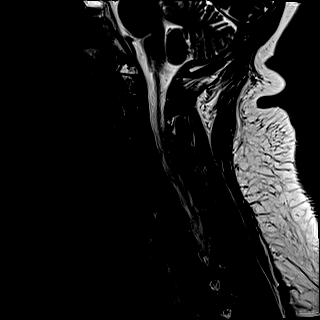
[im 9/15]
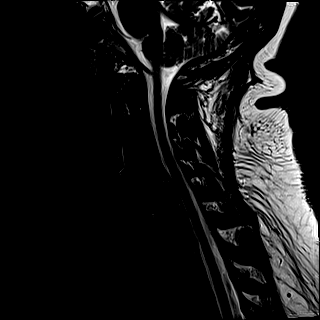
[im 12/15]
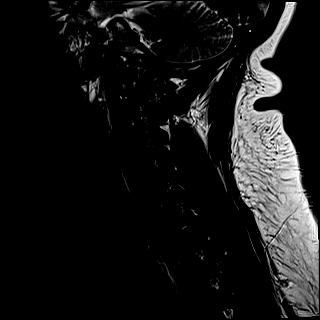
[im 15/15]
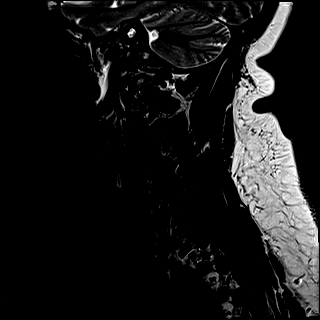

[Series 6: FLAIR · sagittal · 3.0mm · 0.78mm/px · 6 of 15 slices shown]
[im 1/15]
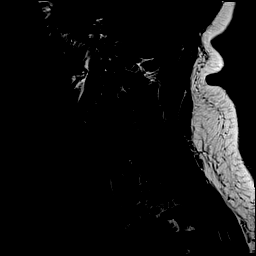
[im 3/15]
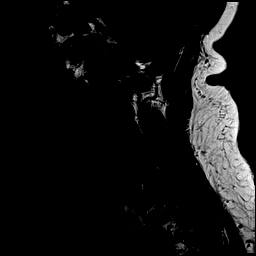
[im 6/15]
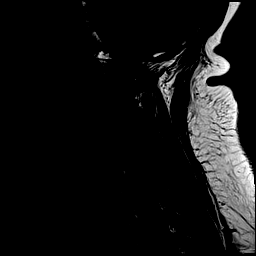
[im 9/15]
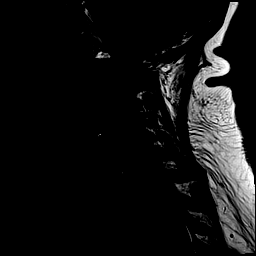
[im 12/15]
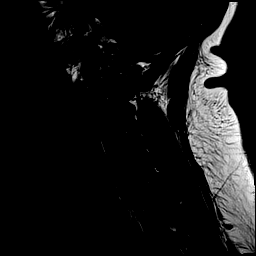
[im 15/15]
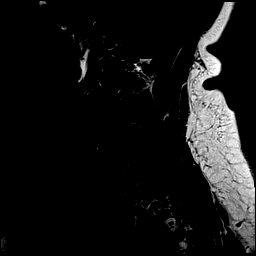

[Series 7: STIR · sagittal · 3.0mm · 0.62mm/px · 6 of 15 slices shown]
[im 1/15]
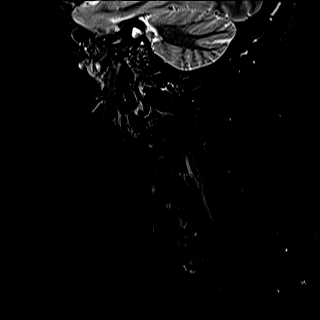
[im 3/15]
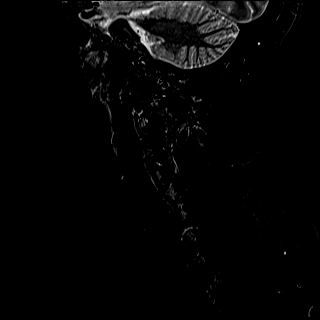
[im 6/15]
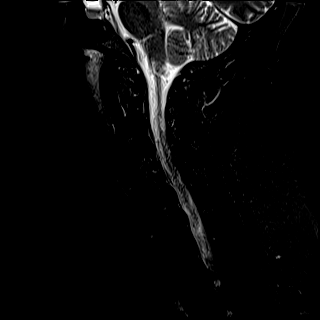
[im 9/15]
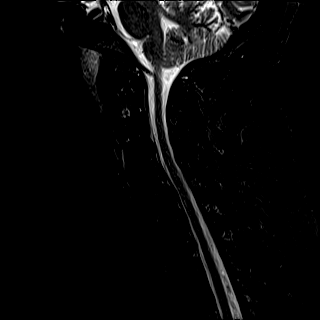
[im 12/15]
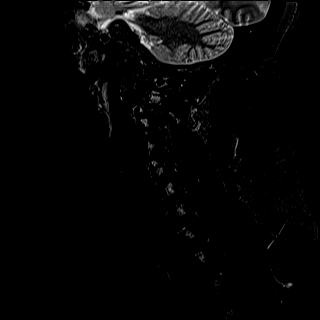
[im 15/15]
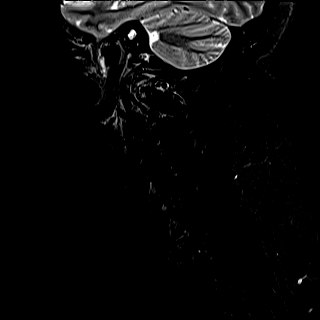

[Series 8: T2 · axial · 3.0mm · 0.70mm/px · z∈[-127,-32]mm · 9 of 29 slices shown (2 of 3)]
[im 1/29]
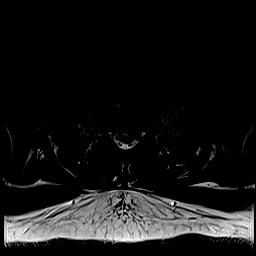
[im 6/29]
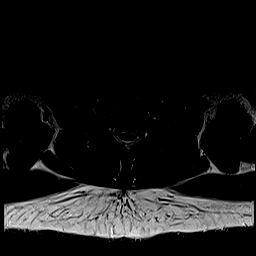
[im 8/29]
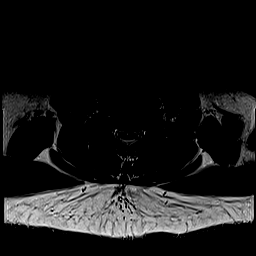
[im 13/29]
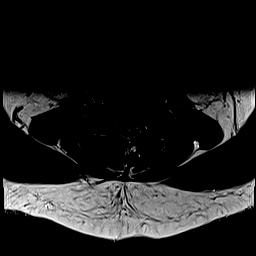
[im 16/29]
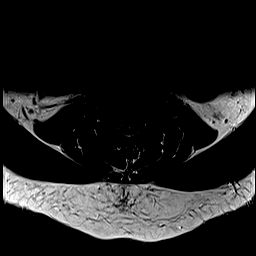
[im 21/29]
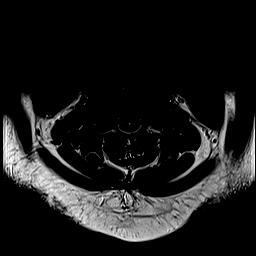
[im 23/29]
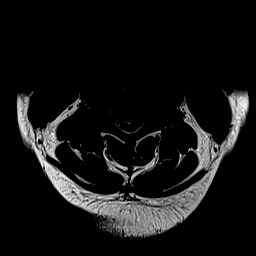
[im 26/29]
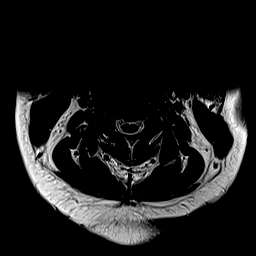
[im 29/29]
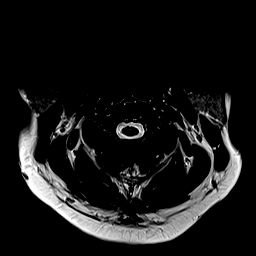

[Series 9: ax mpgr · axial · 3.0mm · 0.35mm/px · z∈[-127,-32]mm · 8 of 29 slices shown]
[im 1/29]
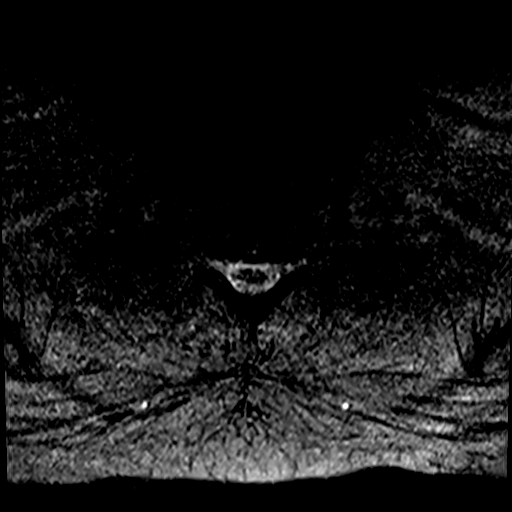
[im 6/29]
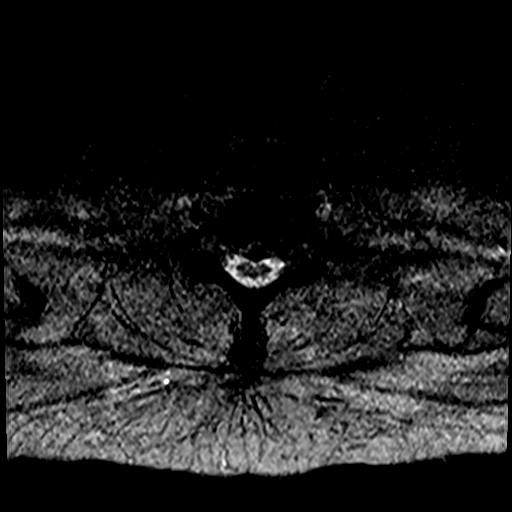
[im 8/29]
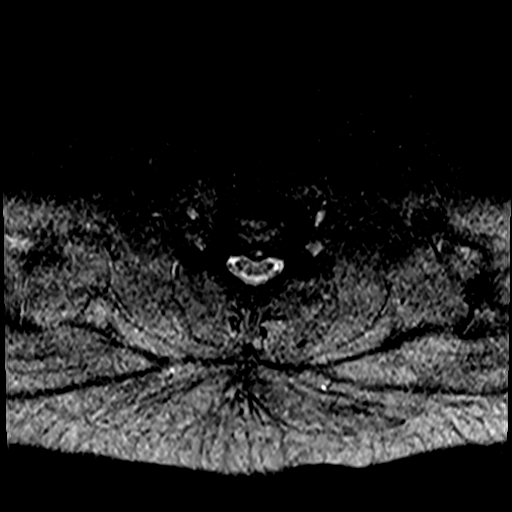
[im 13/29]
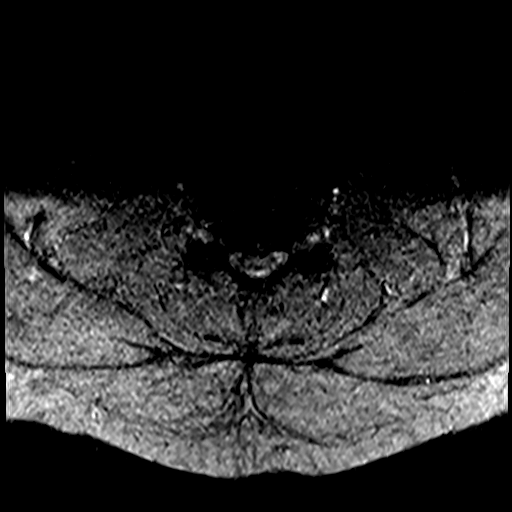
[im 16/29]
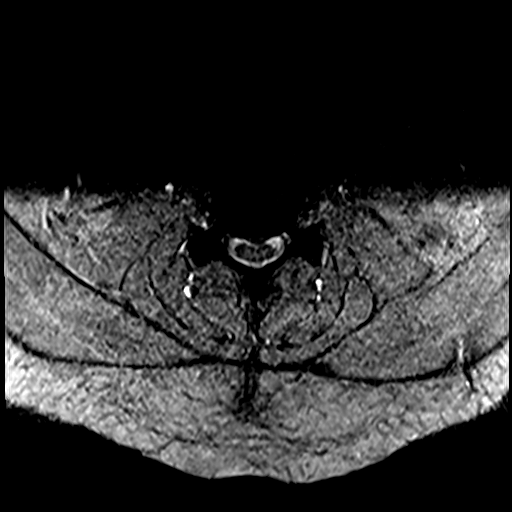
[im 21/29]
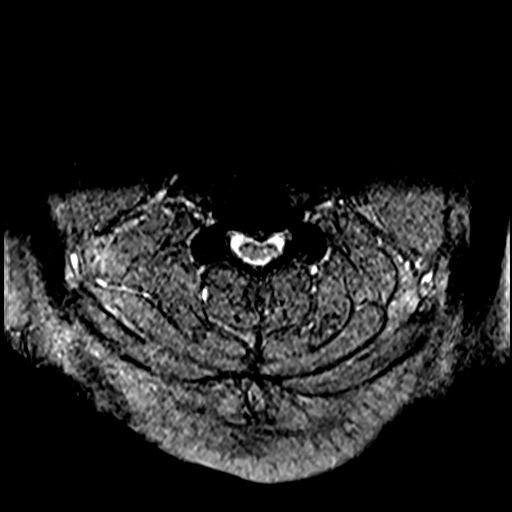
[im 23/29]
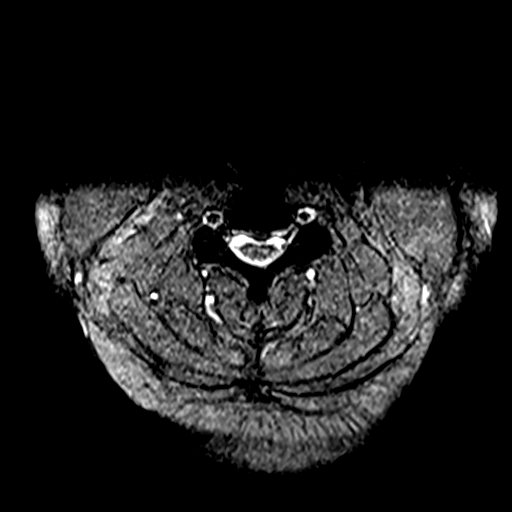
[im 29/29]
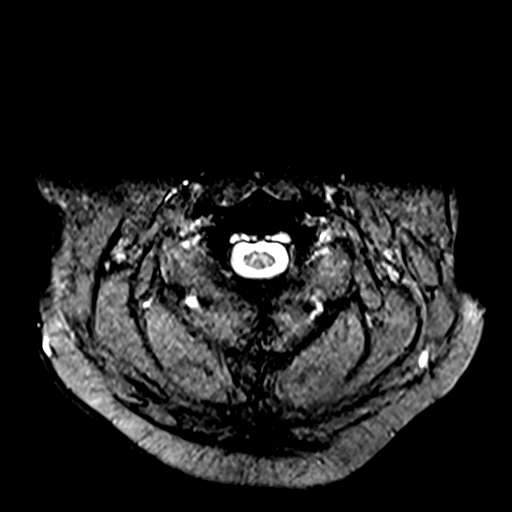

[Series 10: T2 · axial · 3.0mm · 0.70mm/px · z∈[-161,-141]mm · 3 of 7 slices shown (3 of 3)]
[im 1/7]
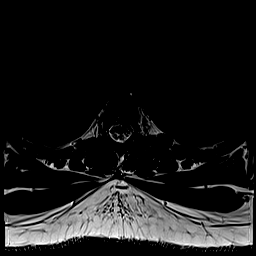
[im 4/7]
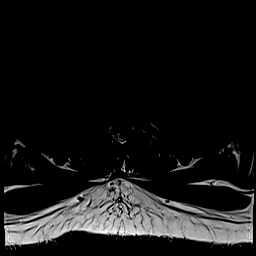
[im 7/7]
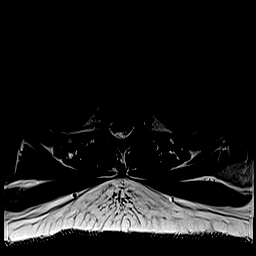

[38 of 48 positions shown; findings below may reference images not displayed]

FINDINGS: Alignment: Previously noted C4-5 listhesis was reduced. No
significant listhesis is present. Straightening of the normal
cervical lordosis is present.

Vertebrae: Marrow signal and vertebral body heights are normal.

Cord: Normal signal and morphology.

Posterior Fossa, vertebral arteries, paraspinal tissues:
Craniocervical junction is normal. Flow is present in the vertebral
arteries bilaterally. Visualized intracranial contents are normal.

Disc levels:

C2-3: Negative.

C3-4: A shallow central disc protrusion is present. Uncovertebral
and facet hypertrophy contribute to stable mild bilateral foraminal
narrowing.

C4-5: The central canal is decompressed. No residual or recurrent
stenosis is present.

C5-6: A broad-based disc osteophyte complex effaces the ventral CSF.
Mild foraminal narrowing is stable bilaterally.

C6-7: A shallow central disc protrusion is stable. This effaces the
ventral CSF. Mild left foraminal narrowing is stable.

C7-T1: A shallow central disc protrusion partially effaces the
ventral CSF without significant interval change. No significant
stenosis is present.
IMPRESSION: 1. Interval decompression of the central canal at C4-5 without
residual or recurrent stenosis.
2. Stable mild bilateral foraminal narrowing at C3-4.
3. Stable mild foraminal narrowing bilaterally at C5-6.
4. Stable mild left foraminal narrowing at C6-7.
5. Shallow central disc protrusion at C7-T1 without significant
stenosis.

## 2021-04-23 ENCOUNTER — Other Ambulatory Visit: Payer: Self-pay

## 2021-04-23 ENCOUNTER — Emergency Department
Admission: EM | Admit: 2021-04-23 | Discharge: 2021-04-24 | Disposition: A | Discharge status: Home / Self Care | Payer: 59 | Attending: Emergency Medicine | Admitting: Emergency Medicine

## 2021-04-23 ENCOUNTER — Emergency Department: Payer: 59

## 2021-04-23 ENCOUNTER — Encounter: Payer: Self-pay | Admitting: Emergency Medicine

## 2021-04-23 DIAGNOSIS — W07XXXA Fall from chair, initial encounter: Secondary | ICD-10-CM | POA: Diagnosis not present

## 2021-04-23 DIAGNOSIS — M25512 Pain in left shoulder: Secondary | ICD-10-CM | POA: Insufficient documentation

## 2021-04-23 DIAGNOSIS — M79671 Pain in right foot: Secondary | ICD-10-CM | POA: Diagnosis not present

## 2021-04-23 DIAGNOSIS — E039 Hypothyroidism, unspecified: Secondary | ICD-10-CM | POA: Diagnosis not present

## 2021-04-23 DIAGNOSIS — M25562 Pain in left knee: Secondary | ICD-10-CM | POA: Diagnosis present

## 2021-04-23 DIAGNOSIS — W19XXXA Unspecified fall, initial encounter: Secondary | ICD-10-CM

## 2021-04-23 IMAGING — CR DG KNEE COMPLETE 4+V*L*
4 series · 4 of 4 positions shown · non-contrast
Comparison: None.

CLINICAL DATA: Status post fall.

EXAM:
LEFT KNEE - COMPLETE 4+ VIEW

[knee ap]
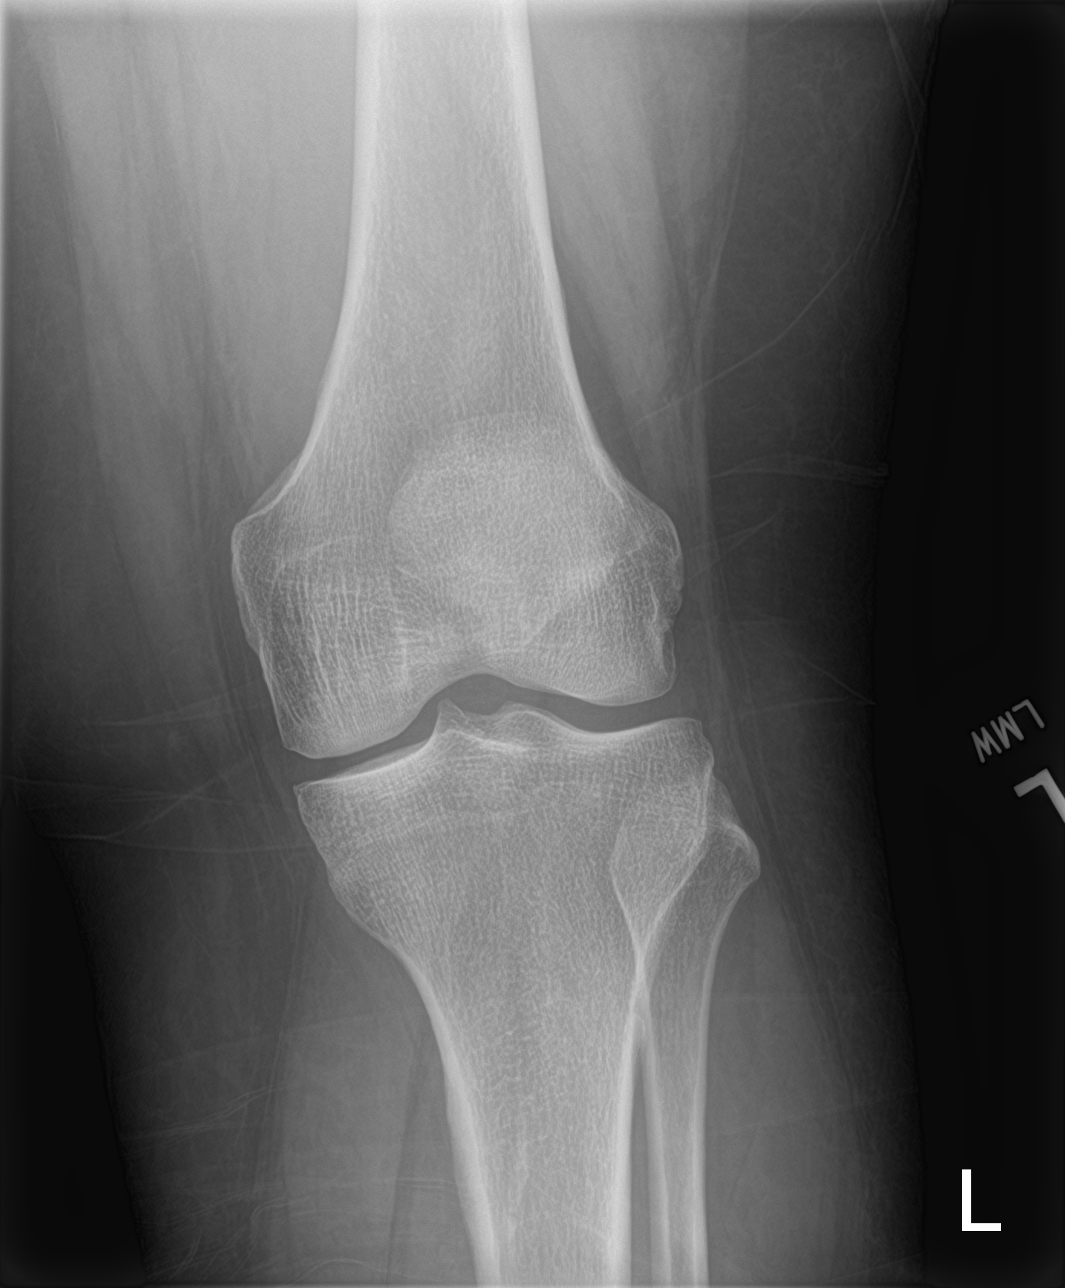

[knee obl (1 of 2)]
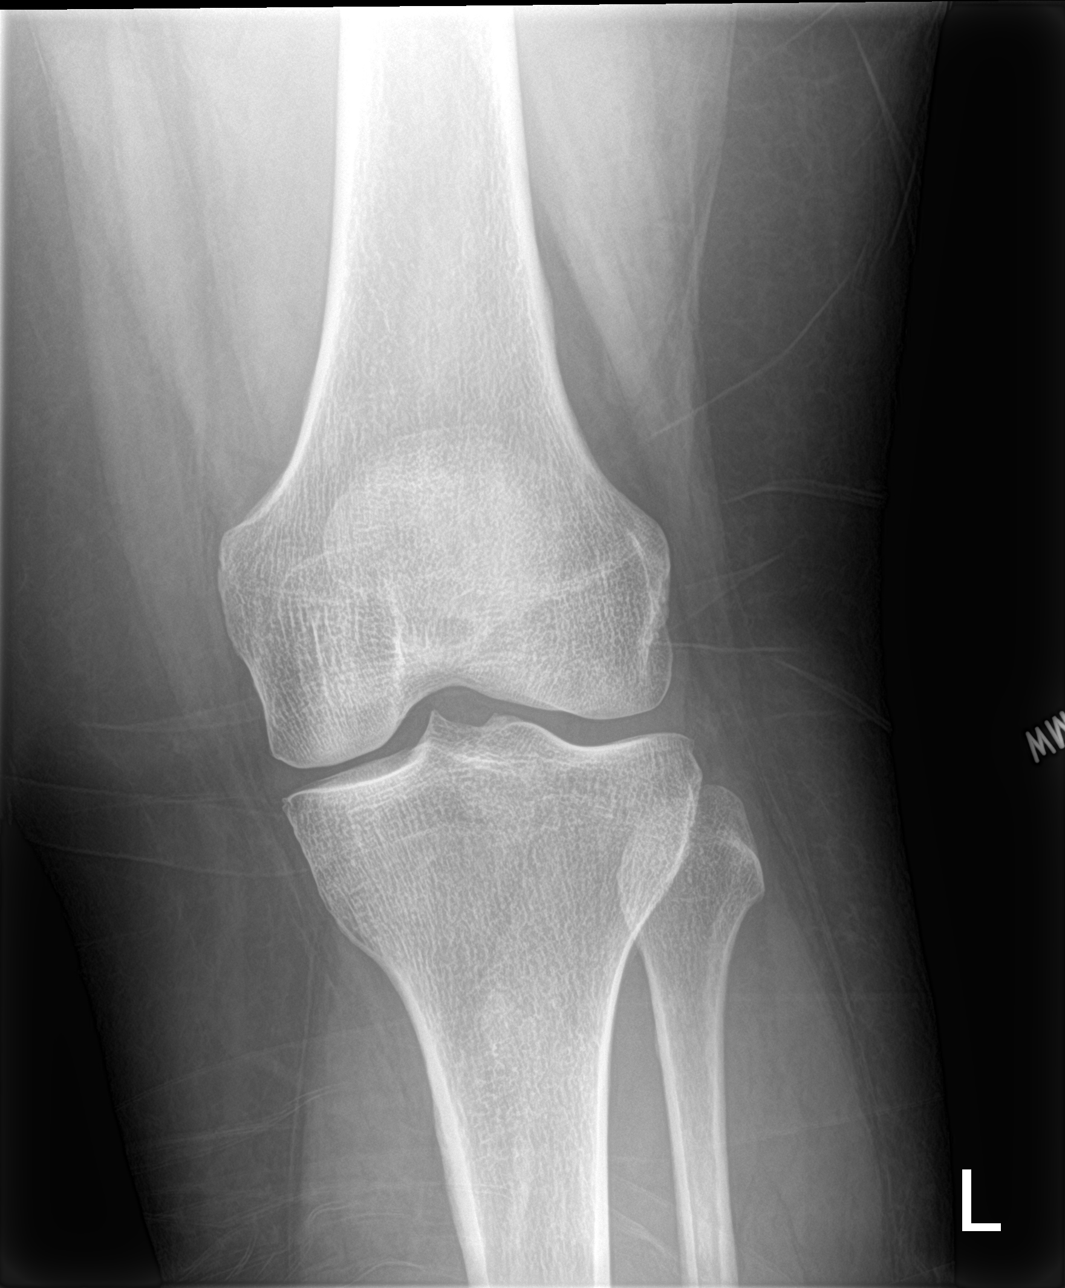

[knee obl (2 of 2)]
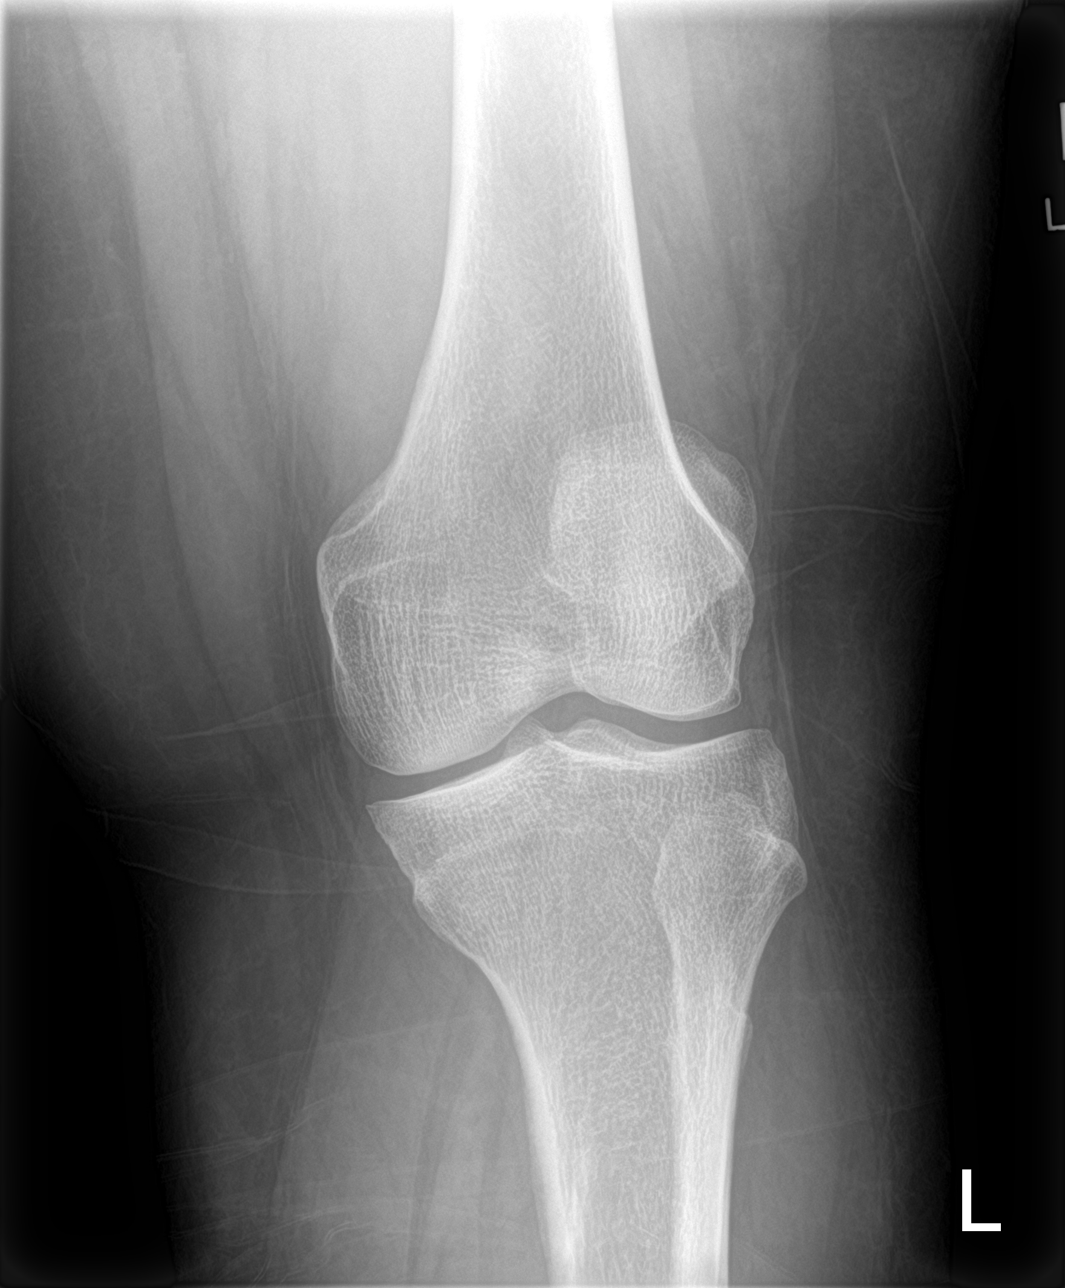

[knee lat]
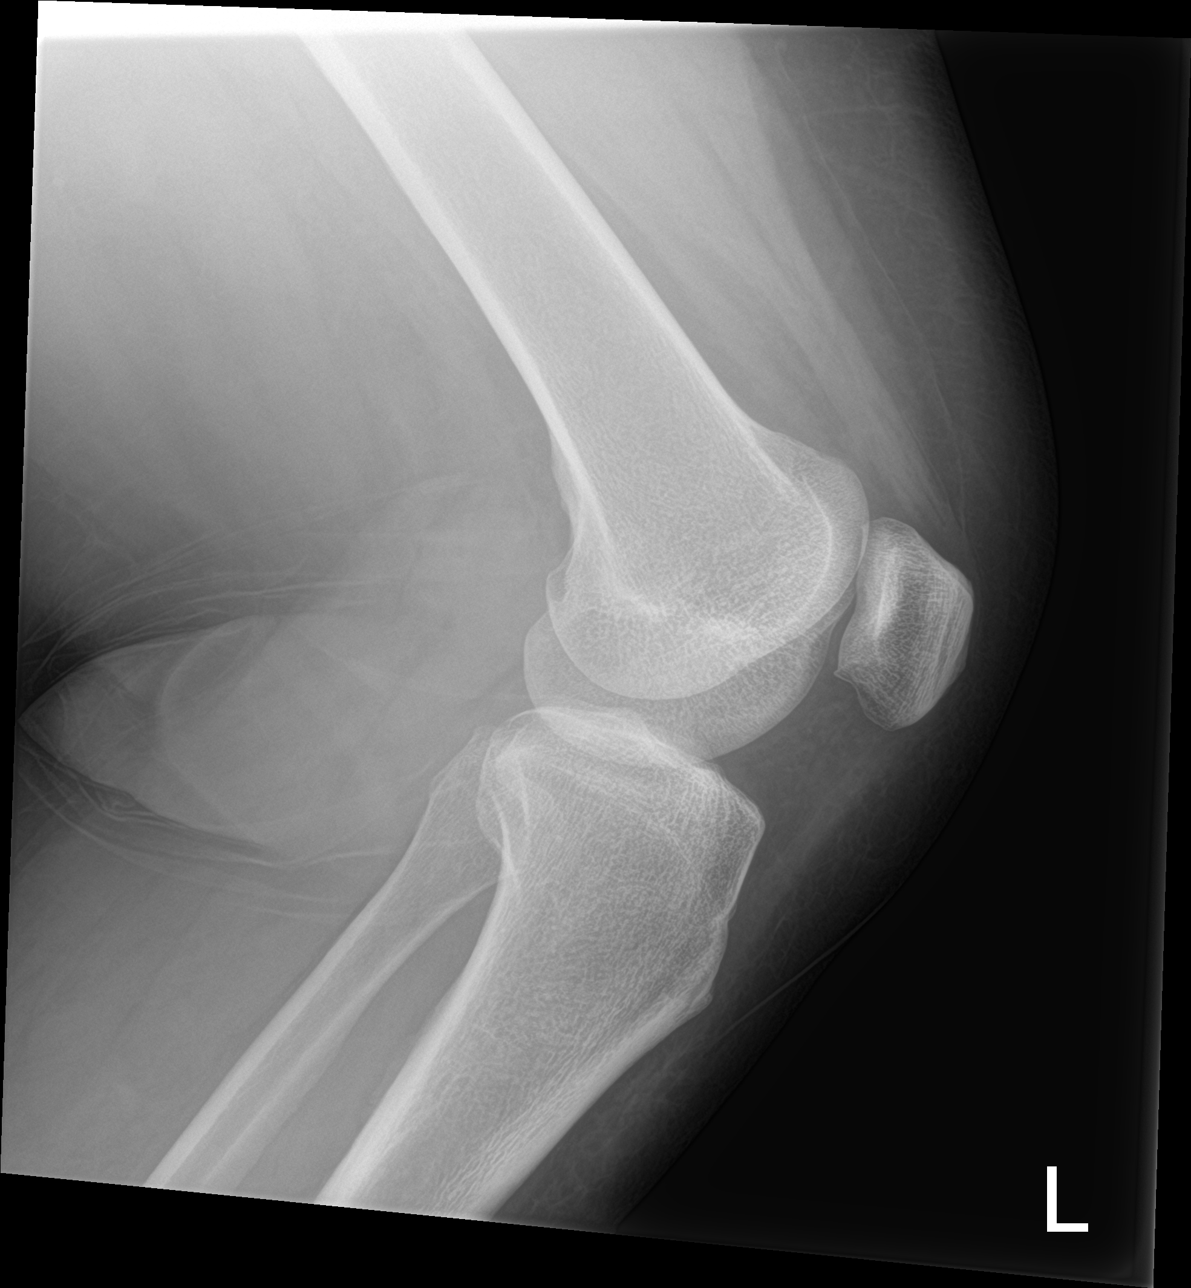

[4 of 4 positions shown; findings below may reference images not displayed]

FINDINGS: No evidence of fracture, dislocation, or joint effusion. No evidence
of arthropathy or other focal bone abnormality. Soft tissues are
unremarkable.
IMPRESSION: Negative.

## 2021-04-23 IMAGING — CR DG SHOULDER 2+V*L*
3 series · 3 of 3 positions shown · non-contrast
Comparison: None.

CLINICAL DATA: Status post fall.

EXAM:
LEFT SHOULDER - 2+ VIEW

[shoulder grashey]
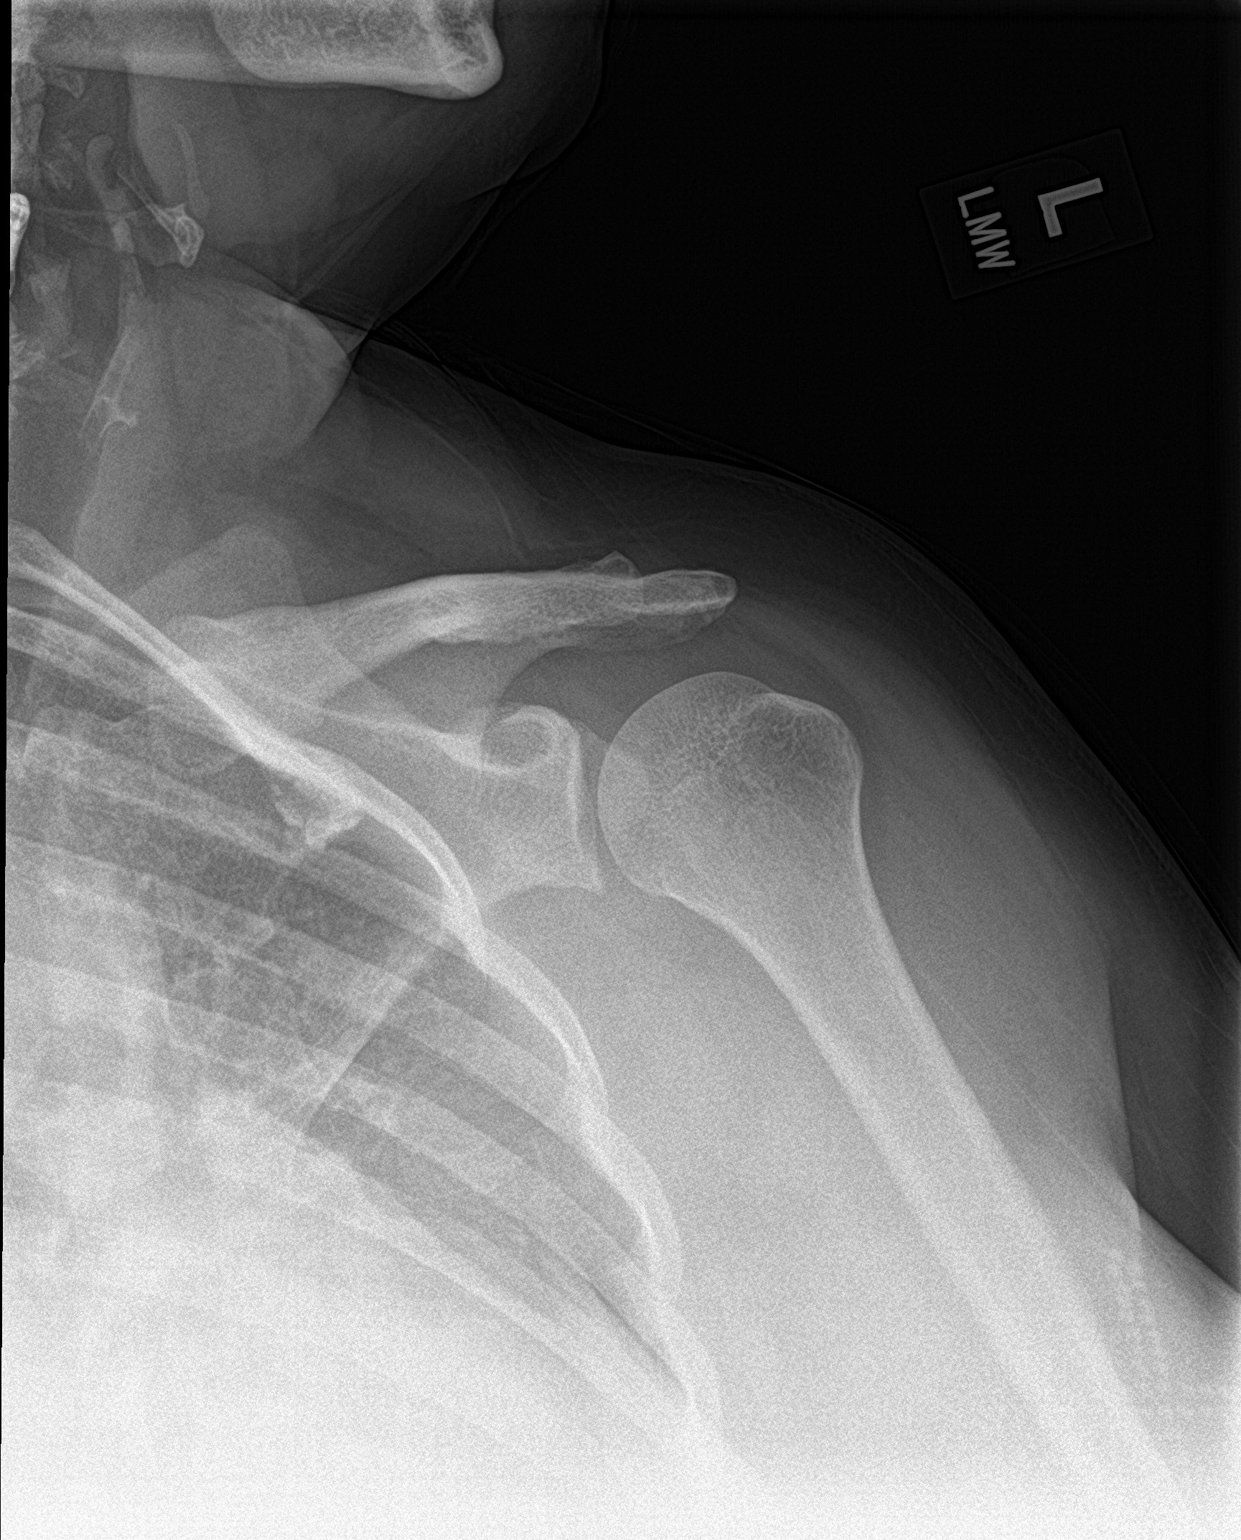

[shoulder y view]
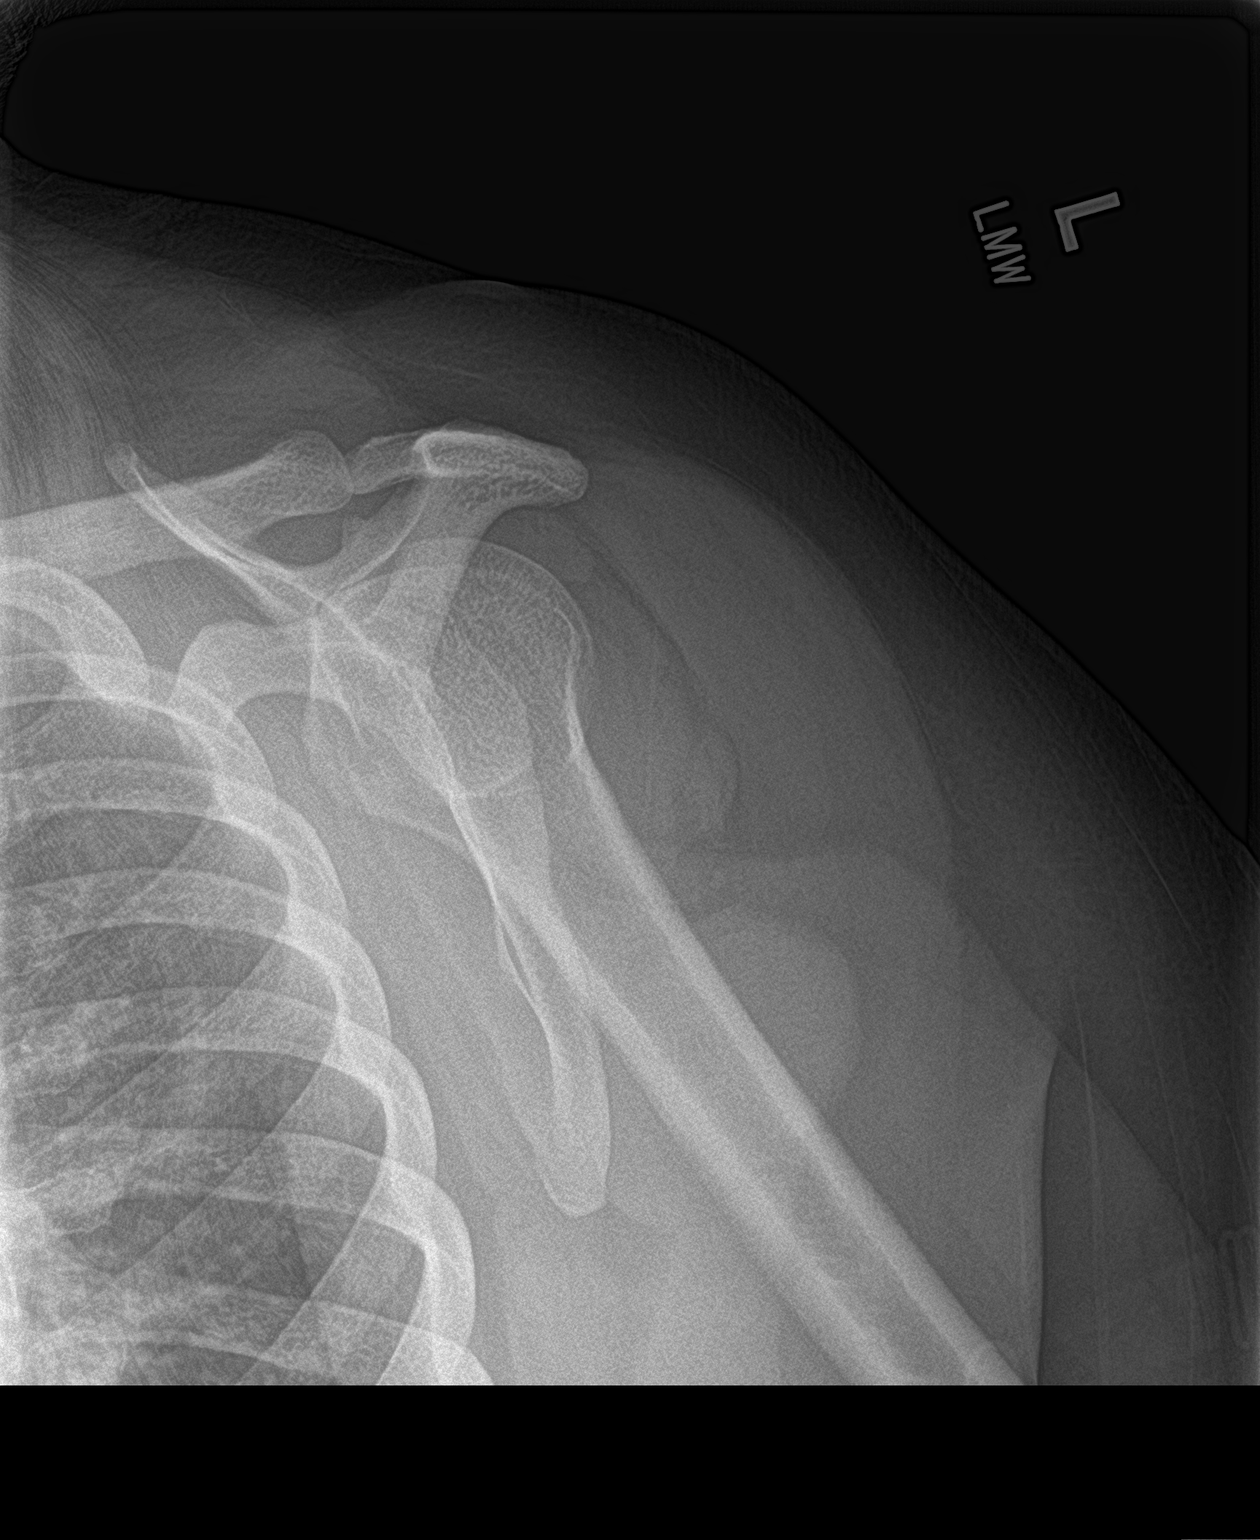

[shoulder ap neutral]
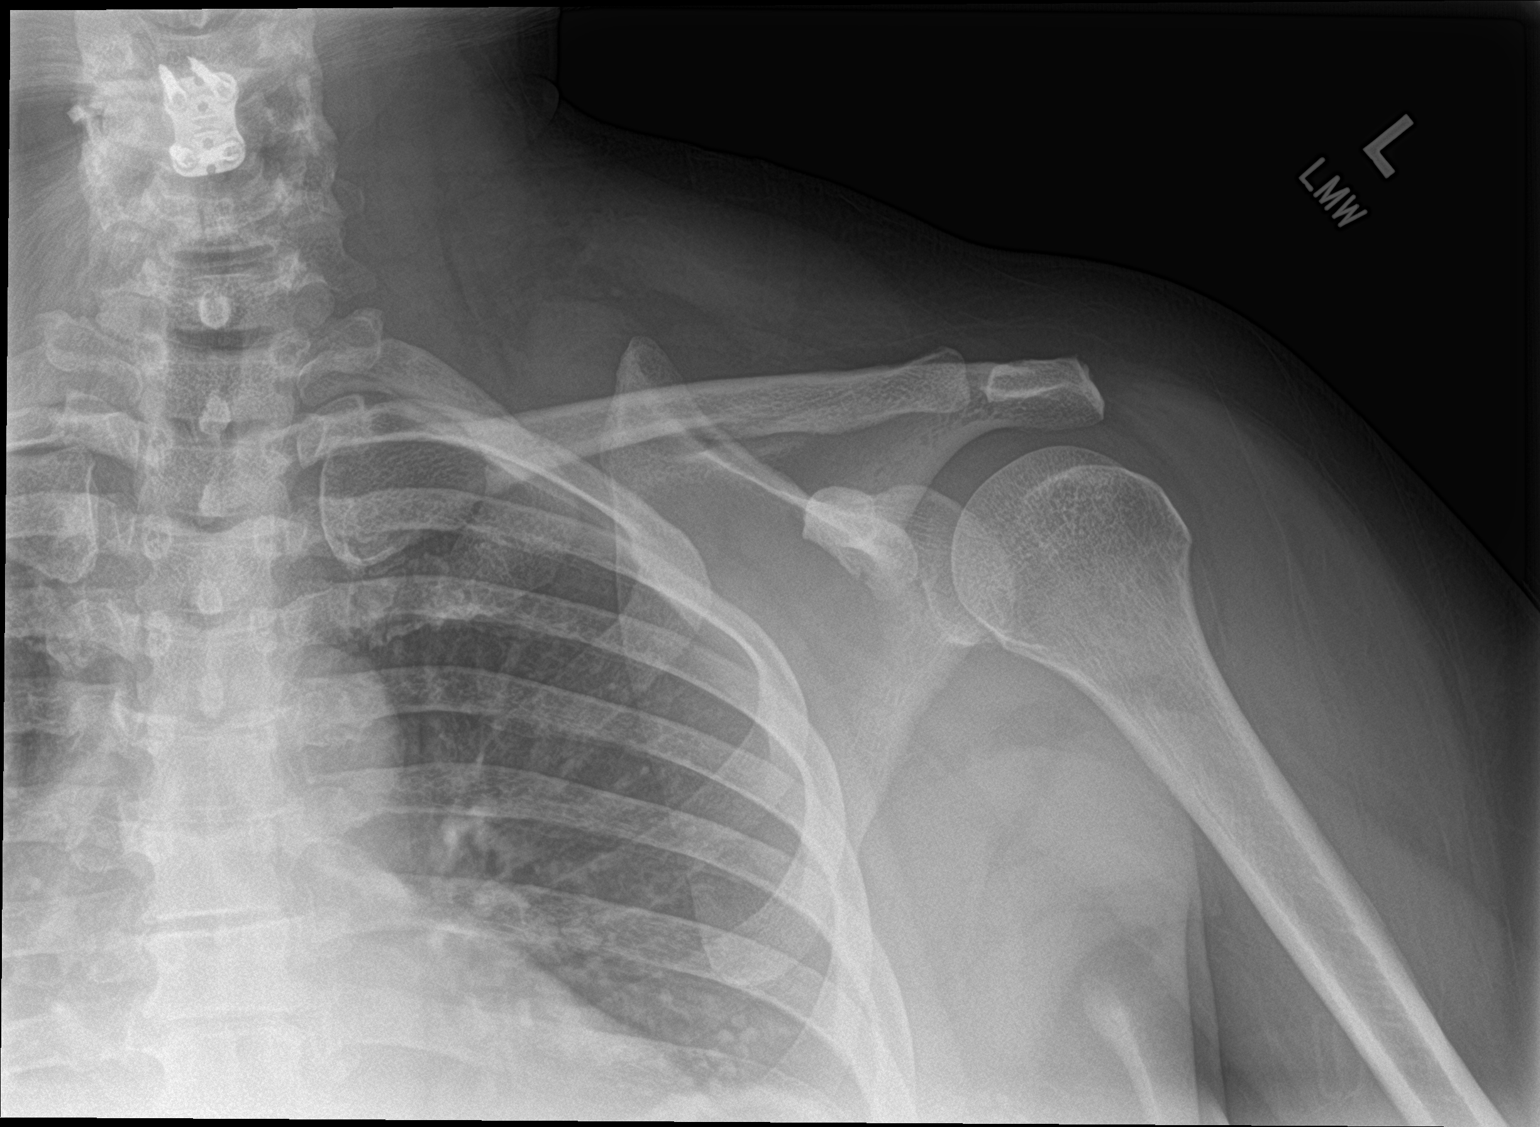

[3 of 3 positions shown; findings below may reference images not displayed]

FINDINGS: There is no evidence of fracture or dislocation. There is no
evidence of arthropathy or other focal bone abnormality. A
radiopaque fusion plate and screws are seen overlying the visualized
portion of the cervical spine. Soft tissues are unremarkable.
IMPRESSION: No acute osseous abnormality.

## 2021-04-23 MED ORDER — ACETAMINOPHEN 500 MG PO TABS
1000.0000 mg | ORAL_TABLET | Freq: Once | ORAL | Status: AC
  Administered 2021-04-24: 1000 mg via ORAL
  Filled 2021-04-23: qty 2

## 2021-04-23 MED ORDER — LIDOCAINE 5 % EX PTCH: 1.0000 | MEDICATED_PATCH | Freq: Two times a day (BID) | CUTANEOUS | 0 refills | Status: AC

## 2021-04-23 MED ORDER — NAPROXEN 500 MG PO TABS
500.0000 mg | ORAL_TABLET | Freq: Once | ORAL | Status: AC
  Administered 2021-04-24: 500 mg via ORAL
  Filled 2021-04-23: qty 1

## 2021-04-23 MED ORDER — LIDOCAINE 5 % EX PTCH
1.0000 | MEDICATED_PATCH | CUTANEOUS | Status: DC
  Administered 2021-04-24: 1 via TRANSDERMAL
  Filled 2021-04-23: qty 1

## 2021-04-23 NOTE — ED Provider Notes (Signed)
? ?[facility] ?Provider Note ? ? ? Event Date/Time  ? First MD Initiated Contact with Patient 04/23/21 2334   ?  (approximate) ? ? ?History  ? ?Fall ? ? ?HPI ? ?Leslie Meadows is a 52 y.o. female who presents to the ED for evaluation of Fall ?  ?I review outpatient visit from yesterday.  Complaints of chronic right foot pain.  Otherwise history of HLD and hypothyroidism.  Morbid obesity. ? ?Patient presents to the ED for evaluation of left-sided knee and shoulder pain after a fall that occurred about 24 hours ago.  She reports a fall on Wednesday morning after her rolling chair slipped out from under her, causing her to fall towards her left side and strike her left knee and FOOSH.  She reports no head trauma, syncope.  Has been ambulatory since that time, but is had increasing pain.  She reports transient improvement of her pain with NSAIDs. ? ?Physical Exam  ? ?Triage Vital Signs: ?ED Triage Vitals  ?Enc Vitals Group  ?   BP 04/23/21 2128 (!) 133/101  ?   Pulse Rate 04/23/21 2128 (!) 103  ?   Resp 04/23/21 2128 20  ?   Temp 04/23/21 2128 98.4 ?F (36.9 ?C)  ?   Temp Source 04/23/21 2128 Oral  ?   SpO2 04/23/21 2128 94 %  ?   Weight 04/23/21 2127 280 lb (127 kg)  ?   Height 04/23/21 2127 5\' 2"  (1.575 m)  ?   Head Circumference --   ?   Peak Flow --   ?   Pain Score 04/23/21 2127 9  ?   Pain Loc --   ?   Pain Edu? --   ?   Excl. in GC? --   ? ? ?Most recent vital signs: ?Vitals:  ? 04/23/21 2128  ?BP: (!) 133/101  ?Pulse: (!) 103  ?Resp: 20  ?Temp: 98.4 ?F (36.9 ?C)  ?SpO2: 94%  ? ? ?General: Awake, no distress.  Morbidly obese.  Pleasant and conversational. ?CV:  Good peripheral perfusion.  ?Resp:  Normal effort.  ?Abd:  No distention.  ?MSK:  No deformity noted.  No external signs of trauma.  Full active and passive ROM of all 4 extremities. ?Mild tenderness to the anterior suprapatellar left knee without laceration or open injury. ?Mild tenderness over the lateral and anterior left shoulder  without evidence of open injury. ?No signs of trauma to the back.  No midline spinal tenderness. ?Neuro:  No focal deficits appreciated. Cranial nerves II through XII intact ?5/5 strength and sensation in all 4 extremities ?Other:   ? ? ?ED Results / Procedures / Treatments  ? ?Labs ?(all labs ordered are listed, but only abnormal results are displayed) ?Labs Reviewed - No data to display ? ?EKG ? ? ?RADIOLOGY ?Plain films of the left knee and left shoulder reviewed by me without evidence of fracture or dislocation. ? ?Official radiology report(s): ?DG Shoulder Left ? ?Result Date: 04/23/2021 ?CLINICAL DATA:  Status post fall. EXAM: LEFT SHOULDER - 2+ VIEW COMPARISON:  None. FINDINGS: There is no evidence of fracture or dislocation. There is no evidence of arthropathy or other focal bone abnormality. A radiopaque fusion plate and screws are seen overlying the visualized portion of the cervical spine. Soft tissues are unremarkable. IMPRESSION: No acute osseous abnormality. Electronically Signed   By: 04/25/2021 M.D.   On: 04/23/2021 21:49  ? ?DG Knee Complete 4 Views Left ? ?Result Date:  04/23/2021 ?CLINICAL DATA:  Status post fall. EXAM: LEFT KNEE - COMPLETE 4+ VIEW COMPARISON:  None. FINDINGS: No evidence of fracture, dislocation, or joint effusion. No evidence of arthropathy or other focal bone abnormality. Soft tissues are unremarkable. IMPRESSION: Negative. Electronically Signed   By: Aram Candela M.D.   On: 04/23/2021 21:50   ? ?PROCEDURES and INTERVENTIONS: ? ?Procedures ? ?Medications  ?acetaminophen (TYLENOL) tablet 1,000 mg (has no administration in time range)  ?naproxen (NAPROSYN) tablet 500 mg (has no administration in time range)  ?lidocaine (LIDODERM) 5 % 1 patch (has no administration in time range)  ? ? ? ?IMPRESSION / MDM / ASSESSMENT AND PLAN / ED COURSE  ?I reviewed the triage vital signs and the nursing notes. ? ?Morbidly obese patient presents to the ED with pain after mechanical fall,  without evidence of significant injury and suitable for outpatient management.  She looks clinically well to me and has a reassuring examination.  No evidence of neurologic vascular deficits.  No external signs of trauma.  Mild tenderness of a couple locations, and plain films at these sites without evidence of fracture, dislocation.  Suspect soft tissue strain and spasm.  We discussed nonnarcotic multimodal analgesia, management at home and return precautions for the ED. ? ?  ? ? ?FINAL CLINICAL IMPRESSION(S) / ED DIAGNOSES  ? ?Final diagnoses:  ?Fall, initial encounter  ? ? ? ?Rx / DC Orders  ? ?ED Discharge Orders   ? ?      Ordered  ?  lidocaine (LIDODERM) 5 %  Every 12 hours       ? 04/23/21 2347  ? ?  ?  ? ?  ? ? ? ?Note:  This document was prepared using Dragon voice recognition software and may include unintentional dictation errors. ?  ?Delton Prairie, MD ?04/24/21 0005 ? ?

## 2021-04-23 NOTE — ED Triage Notes (Signed)
Pt presents following a ground level fall landing on her left shoulder and left knee. No deformities noted. Denies SOB or CP.  ?

## 2021-04-23 NOTE — Discharge Instructions (Signed)
Use Tylenol for pain and fevers.  Up to 1000 mg per dose, up to 4 times per day.  Do not take more than 4000 mg of Tylenol/acetaminophen within 24 hours.. ? ?Please use lidocaine patches at your site of pain.  Apply 1 patch at a time, leave on for 12 hours, then remove for 12 hours.  12 hours on, 12 hours off.  Do not apply more than 1 patch at a time. ? ?Keep taking the Etoralac as well for anti-inflammatory pain relief.  ?

## 2021-04-23 NOTE — ED Notes (Signed)
Pt states that she fell out of the chair at the nail salon yesterday and fell on her left shoulder. Pt's left arm has full ROM.  Pt denies hitting her head and denies taking any blood thinners.  ?

## 2021-11-20 DIAGNOSIS — E785 Hyperlipidemia, unspecified: Secondary | ICD-10-CM | POA: Diagnosis not present

## 2021-11-20 DIAGNOSIS — E1165 Type 2 diabetes mellitus with hyperglycemia: Secondary | ICD-10-CM | POA: Diagnosis not present

## 2021-11-20 DIAGNOSIS — K760 Fatty (change of) liver, not elsewhere classified: Secondary | ICD-10-CM | POA: Diagnosis not present

## 2021-11-20 DIAGNOSIS — E039 Hypothyroidism, unspecified: Secondary | ICD-10-CM | POA: Diagnosis not present

## 2021-11-24 ENCOUNTER — Other Ambulatory Visit: Payer: Self-pay | Admitting: Nurse Practitioner

## 2021-11-24 DIAGNOSIS — Z1231 Encounter for screening mammogram for malignant neoplasm of breast: Secondary | ICD-10-CM

## 2021-11-24 DIAGNOSIS — G4733 Obstructive sleep apnea (adult) (pediatric): Secondary | ICD-10-CM | POA: Diagnosis not present

## 2021-11-24 DIAGNOSIS — E039 Hypothyroidism, unspecified: Secondary | ICD-10-CM | POA: Diagnosis not present

## 2021-11-24 DIAGNOSIS — E6609 Other obesity due to excess calories: Secondary | ICD-10-CM | POA: Diagnosis not present

## 2021-11-24 DIAGNOSIS — E1165 Type 2 diabetes mellitus with hyperglycemia: Secondary | ICD-10-CM | POA: Diagnosis not present

## 2021-11-27 DIAGNOSIS — R0602 Shortness of breath: Secondary | ICD-10-CM | POA: Diagnosis not present

## 2022-03-16 DIAGNOSIS — M79601 Pain in right arm: Secondary | ICD-10-CM | POA: Diagnosis not present

## 2022-03-16 DIAGNOSIS — R531 Weakness: Secondary | ICD-10-CM | POA: Diagnosis not present

## 2022-03-16 DIAGNOSIS — Z981 Arthrodesis status: Secondary | ICD-10-CM | POA: Diagnosis not present

## 2022-03-16 DIAGNOSIS — M79642 Pain in left hand: Secondary | ICD-10-CM | POA: Diagnosis not present

## 2022-03-16 DIAGNOSIS — R200 Anesthesia of skin: Secondary | ICD-10-CM | POA: Diagnosis not present

## 2022-03-16 DIAGNOSIS — G5622 Lesion of ulnar nerve, left upper limb: Secondary | ICD-10-CM | POA: Diagnosis not present

## 2022-03-29 ENCOUNTER — Ambulatory Visit: Payer: 59 | Admitting: Nurse Practitioner

## 2022-03-30 DIAGNOSIS — Z6841 Body Mass Index (BMI) 40.0 and over, adult: Secondary | ICD-10-CM | POA: Diagnosis not present

## 2022-03-30 DIAGNOSIS — Z01818 Encounter for other preprocedural examination: Secondary | ICD-10-CM | POA: Diagnosis not present

## 2022-03-30 DIAGNOSIS — R7303 Prediabetes: Secondary | ICD-10-CM | POA: Diagnosis not present

## 2022-03-30 DIAGNOSIS — K760 Fatty (change of) liver, not elsewhere classified: Secondary | ICD-10-CM | POA: Diagnosis not present

## 2022-03-30 DIAGNOSIS — G5622 Lesion of ulnar nerve, left upper limb: Secondary | ICD-10-CM | POA: Diagnosis not present

## 2022-03-30 DIAGNOSIS — R569 Unspecified convulsions: Secondary | ICD-10-CM | POA: Diagnosis not present

## 2022-03-30 DIAGNOSIS — E119 Type 2 diabetes mellitus without complications: Secondary | ICD-10-CM | POA: Diagnosis not present

## 2022-03-30 DIAGNOSIS — G4733 Obstructive sleep apnea (adult) (pediatric): Secondary | ICD-10-CM | POA: Diagnosis not present

## 2022-03-30 DIAGNOSIS — G959 Disease of spinal cord, unspecified: Secondary | ICD-10-CM | POA: Diagnosis not present

## 2022-03-30 DIAGNOSIS — F419 Anxiety disorder, unspecified: Secondary | ICD-10-CM | POA: Diagnosis not present

## 2022-03-30 DIAGNOSIS — E039 Hypothyroidism, unspecified: Secondary | ICD-10-CM | POA: Diagnosis not present

## 2022-04-05 DIAGNOSIS — E669 Obesity, unspecified: Secondary | ICD-10-CM | POA: Diagnosis not present

## 2022-04-05 DIAGNOSIS — E039 Hypothyroidism, unspecified: Secondary | ICD-10-CM | POA: Diagnosis not present

## 2022-04-05 DIAGNOSIS — Z6841 Body Mass Index (BMI) 40.0 and over, adult: Secondary | ICD-10-CM | POA: Diagnosis not present

## 2022-04-05 DIAGNOSIS — Z79890 Hormone replacement therapy (postmenopausal): Secondary | ICD-10-CM | POA: Diagnosis not present

## 2022-04-05 DIAGNOSIS — G5622 Lesion of ulnar nerve, left upper limb: Secondary | ICD-10-CM | POA: Diagnosis not present

## 2022-04-05 DIAGNOSIS — G4733 Obstructive sleep apnea (adult) (pediatric): Secondary | ICD-10-CM | POA: Diagnosis not present

## 2022-04-05 DIAGNOSIS — Z9989 Dependence on other enabling machines and devices: Secondary | ICD-10-CM | POA: Diagnosis not present

## 2022-04-05 DIAGNOSIS — E1142 Type 2 diabetes mellitus with diabetic polyneuropathy: Secondary | ICD-10-CM | POA: Diagnosis not present

## 2022-04-05 DIAGNOSIS — Z79899 Other long term (current) drug therapy: Secondary | ICD-10-CM | POA: Diagnosis not present

## 2022-04-05 DIAGNOSIS — E785 Hyperlipidemia, unspecified: Secondary | ICD-10-CM | POA: Diagnosis not present

## 2022-04-21 DIAGNOSIS — G5622 Lesion of ulnar nerve, left upper limb: Secondary | ICD-10-CM | POA: Diagnosis not present

## 2022-05-20 ENCOUNTER — Other Ambulatory Visit: Payer: 59

## 2022-05-20 DIAGNOSIS — E1165 Type 2 diabetes mellitus with hyperglycemia: Secondary | ICD-10-CM

## 2022-05-20 DIAGNOSIS — E782 Mixed hyperlipidemia: Secondary | ICD-10-CM

## 2022-05-20 DIAGNOSIS — E039 Hypothyroidism, unspecified: Secondary | ICD-10-CM

## 2022-05-21 ENCOUNTER — Encounter
Admission: RE | Disposition: A | Discharge status: Home / Self Care | Payer: Self-pay | Source: Ambulatory Visit | Attending: Gastroenterology

## 2022-05-21 ENCOUNTER — Ambulatory Visit: Payer: 59 | Admitting: General Practice

## 2022-05-21 ENCOUNTER — Ambulatory Visit
Admission: RE | Admit: 2022-05-21 | Discharge: 2022-05-21 | Disposition: A | Discharge status: Home / Self Care | Payer: 59 | Source: Ambulatory Visit | Attending: Gastroenterology | Admitting: Gastroenterology

## 2022-05-21 ENCOUNTER — Encounter: Payer: Self-pay | Admitting: *Deleted

## 2022-05-21 DIAGNOSIS — G4730 Sleep apnea, unspecified: Secondary | ICD-10-CM | POA: Insufficient documentation

## 2022-05-21 DIAGNOSIS — F419 Anxiety disorder, unspecified: Secondary | ICD-10-CM | POA: Diagnosis not present

## 2022-05-21 DIAGNOSIS — Z6841 Body Mass Index (BMI) 40.0 and over, adult: Secondary | ICD-10-CM | POA: Diagnosis not present

## 2022-05-21 DIAGNOSIS — E119 Type 2 diabetes mellitus without complications: Secondary | ICD-10-CM | POA: Insufficient documentation

## 2022-05-21 DIAGNOSIS — Z7984 Long term (current) use of oral hypoglycemic drugs: Secondary | ICD-10-CM | POA: Diagnosis not present

## 2022-05-21 DIAGNOSIS — E785 Hyperlipidemia, unspecified: Secondary | ICD-10-CM | POA: Insufficient documentation

## 2022-05-21 DIAGNOSIS — Z1211 Encounter for screening for malignant neoplasm of colon: Secondary | ICD-10-CM | POA: Insufficient documentation

## 2022-05-21 DIAGNOSIS — Z800 Family history of malignant neoplasm of digestive organs: Secondary | ICD-10-CM | POA: Insufficient documentation

## 2022-05-21 DIAGNOSIS — E039 Hypothyroidism, unspecified: Secondary | ICD-10-CM | POA: Insufficient documentation

## 2022-05-21 DIAGNOSIS — K640 First degree hemorrhoids: Secondary | ICD-10-CM | POA: Insufficient documentation

## 2022-05-21 DIAGNOSIS — K648 Other hemorrhoids: Secondary | ICD-10-CM | POA: Diagnosis not present

## 2022-05-21 HISTORY — DX: Type 2 diabetes mellitus without complications: E11.9

## 2022-05-21 HISTORY — DX: Morbid (severe) obesity due to excess calories: E66.01

## 2022-05-21 HISTORY — PX: COLONOSCOPY WITH PROPOFOL: SHX5780

## 2022-05-21 LAB — CBC WITH DIFFERENTIAL
Basophils Absolute: 0 10*3/uL (ref 0.0–0.2)
Basos: 1 %
EOS (ABSOLUTE): 0 10*3/uL (ref 0.0–0.4)
Eos: 1 %
Hematocrit: 44.1 % (ref 34.0–46.6)
Hemoglobin: 14.5 g/dL (ref 11.1–15.9)
Immature Grans (Abs): 0 10*3/uL (ref 0.0–0.1)
Immature Granulocytes: 1 %
Lymphocytes Absolute: 1.4 10*3/uL (ref 0.7–3.1)
Lymphs: 42 %
MCH: 27.5 pg (ref 26.6–33.0)
MCHC: 32.9 g/dL (ref 31.5–35.7)
MCV: 84 fL (ref 79–97)
Monocytes Absolute: 0.3 10*3/uL (ref 0.1–0.9)
Monocytes: 8 %
Neutrophils Absolute: 1.6 10*3/uL (ref 1.4–7.0)
Neutrophils: 47 %
RBC: 5.28 x10E6/uL (ref 3.77–5.28)
RDW: 14.6 % (ref 11.7–15.4)
WBC: 3.3 10*3/uL — ABNORMAL LOW (ref 3.4–10.8)

## 2022-05-21 LAB — CMP14+EGFR
ALT: 38 IU/L — ABNORMAL HIGH (ref 0–32)
AST: 32 IU/L (ref 0–40)
Albumin/Globulin Ratio: 1.6 (ref 1.2–2.2)
Albumin: 4.6 g/dL (ref 3.8–4.9)
Alkaline Phosphatase: 67 IU/L (ref 44–121)
BUN/Creatinine Ratio: 12 (ref 9–23)
BUN: 10 mg/dL (ref 6–24)
Bilirubin Total: 0.6 mg/dL (ref 0.0–1.2)
CO2: 23 mmol/L (ref 20–29)
Calcium: 9.8 mg/dL (ref 8.7–10.2)
Chloride: 103 mmol/L (ref 96–106)
Creatinine, Ser: 0.86 mg/dL (ref 0.57–1.00)
Globulin, Total: 2.8 g/dL (ref 1.5–4.5)
Glucose: 94 mg/dL (ref 70–99)
Potassium: 4.3 mmol/L (ref 3.5–5.2)
Sodium: 140 mmol/L (ref 134–144)
Total Protein: 7.4 g/dL (ref 6.0–8.5)
eGFR: 82 mL/min/{1.73_m2} (ref >59)

## 2022-05-21 LAB — LIPID PANEL
Chol/HDL Ratio: 3.3 ratio (ref 0.0–4.4)
Cholesterol, Total: 143 mg/dL (ref 100–199)
HDL: 44 mg/dL (ref >39)
LDL Chol Calc (NIH): 84 mg/dL (ref 0–99)
Triglycerides: 74 mg/dL (ref 0–149)
VLDL Cholesterol Cal: 15 mg/dL (ref 5–40)

## 2022-05-21 LAB — GLUCOSE, CAPILLARY: Glucose-Capillary: 116 mg/dL — ABNORMAL HIGH (ref 70–99)

## 2022-05-21 LAB — TSH: TSH: 1.67 u[IU]/mL (ref 0.450–4.500)

## 2022-05-21 LAB — HEMOGLOBIN A1C
Est. average glucose Bld gHb Est-mCnc: 143 mg/dL
Hgb A1c MFr Bld: 6.6 % — ABNORMAL HIGH (ref 4.8–5.6)

## 2022-05-21 SURGERY — COLONOSCOPY WITH PROPOFOL
Anesthesia: General

## 2022-05-21 MED ORDER — PROPOFOL 10 MG/ML IV BOLUS
INTRAVENOUS | Status: DC | PRN
  Administered 2022-05-21: 110 mg via INTRAVENOUS

## 2022-05-21 MED ORDER — SODIUM CHLORIDE 0.9 % IV SOLN: INTRAVENOUS | Status: DC

## 2022-05-21 MED ORDER — PROPOFOL 500 MG/50ML IV EMUL
INTRAVENOUS | Status: DC | PRN
  Administered 2022-05-21: 175 ug/kg/min via INTRAVENOUS

## 2022-05-21 MED ORDER — PHENYLEPHRINE HCL (PRESSORS) 10 MG/ML IV SOLN
INTRAVENOUS | Status: DC | PRN
  Administered 2022-05-21 (×3): 160 ug via INTRAVENOUS

## 2022-05-21 MED ORDER — LIDOCAINE HCL (CARDIAC) PF 100 MG/5ML IV SOSY
PREFILLED_SYRINGE | INTRAVENOUS | Status: DC | PRN
  Administered 2022-05-21: 100 mg via INTRAVENOUS

## 2022-05-21 NOTE — Anesthesia Preprocedure Evaluation (Signed)
Anesthesia Evaluation  Patient identified by MRN, date of birth, ID band Patient awake    Reviewed: Allergy & Precautions, NPO status , Patient's Chart, lab work & pertinent test results  Airway Mallampati: III  TM Distance: >3 FB Neck ROM: full    Dental  (+) Chipped   Pulmonary sleep apnea and Continuous Positive Airway Pressure Ventilation    Pulmonary exam normal        Cardiovascular (-) angina (-) Past MI negative cardio ROS Normal cardiovascular exam(-) dysrhythmias      Neuro/Psych Seizures -, Well Controlled,  PSYCHIATRIC DISORDERS Anxiety        GI/Hepatic negative GI ROS, Neg liver ROS,,,  Endo/Other  diabetesHypothyroidism    Renal/GU negative Renal ROS  negative genitourinary   Musculoskeletal   Abdominal   Peds  Hematology negative hematology ROS (+)   Anesthesia Other Findings Patient states she had a bad experience with a previous anesthesia that she had at [facility]. States that she had neck surgery and was in the hospital for a day after. After she went home, she started to have seizures and a fast heart rate which she came back to the ED for. She was admitted for several days and released. She states she doesn't know what caused it but thinks it was the anesthesia. Discussed the risks of allergies to the medications, injury to her brain, heart lungs today. Patient stated she understood the risks of anesthesia and wanted to proceed.   Past Medical History: No date: Diabetes mellitus without complication No date: HLD (hyperlipidemia) No date: Morbid obesity with BMI of 50.0-59.9, adult No date: Sleep apnea No date: Thyroid disease  Past Surgical History: No date: ABDOMINAL HYSTERECTOMY 10/27/2020: ANTERIOR CERVICAL DECOMP/DISCECTOMY FUSION; N/A     Comment:  Procedure: C4-5 ANTERIOR CERVICAL               DECOMPRESSION/DISCECTOMY FUSION 1 LEVEL;  Surgeon: Lucy Chris, MD;  Location: [facility]  ORS;  Service: Neurosurgery;               Laterality: N/A; No date: TUBAL LIGATION No date: ulna nerve decompression; Left     Reproductive/Obstetrics negative OB ROS                             Anesthesia Physical Anesthesia Plan  ASA: 3  Anesthesia Plan: General   Post-op Pain Management: Minimal or no pain anticipated   Induction: Intravenous  PONV Risk Score and Plan: 3 and Propofol infusion, TIVA and Ondansetron  Airway Management Planned: Nasal Cannula  Additional Equipment: None  Intra-op Plan:   Post-operative Plan:   Informed Consent: I have reviewed the patients History and Physical, chart, labs and discussed the procedure including the risks, benefits and alternatives for the proposed anesthesia with the patient or authorized representative who has indicated his/her understanding and acceptance.     Dental advisory given  Plan Discussed with: CRNA and Surgeon  Anesthesia Plan Comments: (Discussed risks of anesthesia with patient, including possibility of difficulty with spontaneous ventilation under anesthesia necessitating airway intervention, PONV, and rare risks such as cardiac or respiratory or neurological events, and allergic reactions. Discussed the role of CRNA in patient's perioperative care. Patient understands.)       Anesthesia Quick Evaluation

## 2022-05-21 NOTE — Anesthesia Postprocedure Evaluation (Signed)
Anesthesia Post Note  Patient: Leslie Meadows  Procedure(s) Performed: COLONOSCOPY WITH PROPOFOL  Patient location during evaluation: Endoscopy Anesthesia Type: General Level of consciousness: awake and alert Pain management: pain level controlled Vital Signs Assessment: post-procedure vital signs reviewed and stable Respiratory status: spontaneous breathing, nonlabored ventilation, respiratory function stable and patient connected to nasal cannula oxygen Cardiovascular status: blood pressure returned to baseline and stable Postop Assessment: no apparent nausea or vomiting Anesthetic complications: no  No notable events documented.   Last Vitals:  Vitals:   05/21/22 0955 05/21/22 0957  BP: (!) 86/48 (!) 90/58  Pulse: 90 73  Resp: 15 14  Temp: (!) 36.2 C   SpO2: 99% 98%    Last Pain:  Vitals:   05/21/22 0955  TempSrc: Temporal  PainSc: Asleep                 Stephanie Coup

## 2022-05-21 NOTE — Interval H&P Note (Signed)
History and Physical Interval Note:  05/21/2022 9:20 AM  Leslie Meadows  has presented today for surgery, with the diagnosis of Colon Cancer Screening.  The various methods of treatment have been discussed with the patient and family. After consideration of risks, benefits and other options for treatment, the patient has consented to  Procedure(s) with comments: COLONOSCOPY WITH PROPOFOL (N/A) - DM as a surgical intervention.  The patient's history has been reviewed, patient examined, no change in status, stable for surgery.  I have reviewed the patient's chart and labs.  Questions were answered to the patient's satisfaction.     Regis Bill  Ok to proceed with colonoscopy

## 2022-05-21 NOTE — H&P (Signed)
Outpatient short stay form Pre-procedure 05/21/2022  Regis Bill, MD  Primary Physician: Margaretann Loveless, MD  Reason for visit:  Screening  History of present illness:    52 y/o lady with history of obesity, hypothyroidism, and DM II here for index screening colonoscopy. Father had colon cancer in his 44's. No blood thinners. History of hysterectomy. No new symptoms.    Current Facility-Administered Medications:    0.9 %  sodium chloride infusion, , Intravenous, Continuous, Rever Pichette, Rossie Muskrat, MD  Medications Prior to Admission  Medication Sig Dispense Refill Last Dose   glimepiride (AMARYL) 4 MG tablet Take 4 mg by mouth daily with breakfast.   Past Week   levothyroxine (SYNTHROID) 50 MCG tablet Take 50 mcg by mouth daily before breakfast.   05/20/2022   rosuvastatin (CRESTOR) 40 MG tablet Take 40 mg by mouth at bedtime.   Past Week   glipiZIDE (GLUCOTROL XL) 10 MG 24 hr tablet Take 10 mg by mouth daily with breakfast. (Patient not taking: Reported on 05/21/2022)   Not Taking   hydrOXYzine (VISTARIL) 25 MG capsule Take 25 mg by mouth.      NON FORMULARY Pt uses a cpap nightly      nystatin powder         No Known Allergies   Past Medical History:  Diagnosis Date   Diabetes mellitus without complication    HLD (hyperlipidemia)    Morbid obesity with BMI of 50.0-59.9, adult    Sleep apnea    Thyroid disease     Review of systems:  Otherwise negative.    Physical Exam  Gen: Alert, oriented. Appears stated age.  HEENT: PERRLA. Lungs: No respiratory distress CV: RRR Abd: soft, benign, no masses Ext: No edema    Planned procedures: Proceed with colonoscopy. The patient understands the nature of the planned procedure, indications, risks, alternatives and potential complications including but not limited to bleeding, infection, perforation, damage to internal organs and possible oversedation/side effects from anesthesia. The patient agrees and gives consent to  proceed.  Please refer to procedure notes for findings, recommendations and patient disposition/instructions.     Regis Bill, MD [facility]

## 2022-05-21 NOTE — Transfer of Care (Signed)
Immediate Anesthesia Transfer of Care Note  Patient: Leslie Meadows  Procedure(s) Performed: COLONOSCOPY WITH PROPOFOL  Patient Location: Endoscopy Unit  Anesthesia Type:General  Level of Consciousness: drowsy  Airway & Oxygen Therapy: Patient Spontanous Breathing  Post-op Assessment: Report given to RN and Post -op Vital signs reviewed and stable  Post vital signs: Reviewed and stable  Last Vitals:  Vitals Value Taken Time  BP 90/58 05/21/22 0957  Temp 36.2 C 05/21/22 0955  Pulse 79 05/21/22 0957  Resp 17 05/21/22 0957  SpO2 99 % 05/21/22 0957  Vitals shown include unvalidated device data.  Last Pain:  Vitals:   05/21/22 0955  TempSrc: Temporal  PainSc: Asleep         Complications: No notable events documented.

## 2022-05-21 NOTE — Op Note (Signed)
[facility] Patient Name: Leslie Meadows Procedure Date: 05/21/2022 9:05 AM MRN: [id number] Account #: [id number] Date of Birth: 03-09-1970 Admit Type: Outpatient Age: 52 Room: [facility] ENDO ROOM 3 Gender: Female Note Status: Finalized Instrument Name: Prentice Docker [id number] Procedure:             Colonoscopy Indications:           Screening patient at increased risk: Family history of                         1st-degree relative with colorectal cancer at age 38                         years (or older) Providers:             Eather Colas MD, MD Referring MD:          Garey Ham (Referring MD) Medicines:             Monitored Anesthesia Care Complications:         No immediate complications. Procedure:             Pre-Anesthesia Assessment:                        - Prior to the procedure, a History and Physical was                         performed, and patient medications and allergies were                         reviewed. The patient is competent. The risks and                         benefits of the procedure and the sedation options and                         risks were discussed with the patient. All questions                         were answered and informed consent was obtained.                         Patient identification and proposed procedure were                         verified by the physician, the nurse, the                         anesthesiologist, the anesthetist and the technician                         in the endoscopy suite. Mental Status Examination:                         alert and oriented. Airway Examination: normal                         oropharyngeal airway and neck mobility. Respiratory  Examination: clear to auscultation. CV Examination:                         normal. Prophylactic Antibiotics: The patient does not                         require prophylactic antibiotics. Prior                          Anticoagulants: The patient has taken no anticoagulant                         or antiplatelet agents. ASA Grade Assessment: III - A                         patient with severe systemic disease. After reviewing                         the risks and benefits, the patient was deemed in                         satisfactory condition to undergo the procedure. The                         anesthesia plan was to use monitored anesthesia care                         (MAC). Immediately prior to administration of                         medications, the patient was re-assessed for adequacy                         to receive sedatives. The heart rate, respiratory                         rate, oxygen saturations, blood pressure, adequacy of                         pulmonary ventilation, and response to care were                         monitored throughout the procedure. The physical                         status of the patient was re-assessed after the                         procedure.                        After obtaining informed consent, the colonoscope was                         passed under direct vision. Throughout the procedure,                         the patient's blood pressure, pulse, and oxygen  saturations were monitored continuously. The                         Colonoscope was introduced through the anus and                         advanced to the the terminal ileum. The colonoscopy                         was performed without difficulty. The patient                         tolerated the procedure well. The quality of the bowel                         preparation was good. The terminal ileum, ileocecal                         valve, appendiceal orifice, and rectum were                         photographed. Findings:      The perianal and digital rectal examinations were normal.      The terminal ileum appeared normal.      Internal hemorrhoids were  found during retroflexion. The hemorrhoids       were Grade I (internal hemorrhoids that do not prolapse).      The exam was otherwise without abnormality on direct and retroflexion       views. Impression:            - The examined portion of the ileum was normal.                        - Internal hemorrhoids.                        - The examination was otherwise normal on direct and                         retroflexion views.                        - No specimens collected. Recommendation:        - Discharge patient to home.                        - Resume previous diet.                        - Continue present medications.                        - Repeat colonoscopy in 10 years for screening                         purposes.                        - Return to referring physician as previously  scheduled. Procedure Code(s):     --- Professional ---                        U9811, Colorectal cancer screening; colonoscopy on                         individual at high risk Diagnosis Code(s):     --- Professional ---                        Z80.0, Family history of malignant neoplasm of                         digestive organs                        K64.0, First degree hemorrhoids CPT copyright 2022 [employer]. All rights reserved. The codes documented in this report are preliminary and upon coder review may  be revised to meet current compliance requirements. Eather Colas MD, MD 05/21/2022 9:57:44 AM Number of Addenda: 0 Note Initiated On: 05/21/2022 9:05 AM Scope Withdrawal Time: 0 hours 7 minutes 56 seconds  Total Procedure Duration: 0 hours 11 minutes 1 second  Estimated Blood Loss:  Estimated blood loss: none.      [facility]

## 2022-05-24 ENCOUNTER — Ambulatory Visit (INDEPENDENT_AMBULATORY_CARE_PROVIDER_SITE_OTHER): Payer: 59 | Admitting: Nurse Practitioner

## 2022-05-24 ENCOUNTER — Encounter: Payer: Self-pay | Admitting: Nurse Practitioner

## 2022-05-24 VITALS — BP 130/80 | HR 108 | Ht 62.0 in | Wt 285.4 lb

## 2022-05-24 DIAGNOSIS — E119 Type 2 diabetes mellitus without complications: Secondary | ICD-10-CM | POA: Insufficient documentation

## 2022-05-24 DIAGNOSIS — E039 Hypothyroidism, unspecified: Secondary | ICD-10-CM | POA: Diagnosis not present

## 2022-05-24 DIAGNOSIS — G4730 Sleep apnea, unspecified: Secondary | ICD-10-CM

## 2022-05-24 DIAGNOSIS — E1169 Type 2 diabetes mellitus with other specified complication: Secondary | ICD-10-CM

## 2022-05-24 DIAGNOSIS — E663 Overweight: Secondary | ICD-10-CM | POA: Diagnosis not present

## 2022-05-24 DIAGNOSIS — Z1231 Encounter for screening mammogram for malignant neoplasm of breast: Secondary | ICD-10-CM | POA: Diagnosis not present

## 2022-05-24 HISTORY — DX: Overweight: E66.3

## 2022-05-24 MED ORDER — EMPAGLIFLOZIN 25 MG PO TABS
25.0000 mg | ORAL_TABLET | Freq: Every day | ORAL | 11 refills | Status: DC
Start: 2022-05-24 — End: 2023-01-17

## 2022-05-24 NOTE — Patient Instructions (Signed)
1) Stricter dietary control for DMII 2) Adding on Jardiance, stop glimepiride 3) Follow up appt in 4 months, fasting labs prior

## 2022-05-24 NOTE — Progress Notes (Signed)
Established Patient Office Visit  Subjective:  Patient ID: Leslie Meadows, female    DOB: 1970-12-26  Age: 52 y.o. MRN: [id number]  Chief Complaint  Patient presents with   Follow-up    Lab results    Follow up and lab results.  Left ulnar nerve decompression.  Surgery was Apr 01, 2022.      No other concerns at this time.   Past Medical History:  Diagnosis Date   Diabetes mellitus without complication    HLD (hyperlipidemia)    Morbid obesity with BMI of 50.0-59.9, adult    Sleep apnea    Thyroid disease     Past Surgical History:  Procedure Laterality Date   ABDOMINAL HYSTERECTOMY     ANTERIOR CERVICAL DECOMP/DISCECTOMY FUSION N/A 10/27/2020   Procedure: C4-5 ANTERIOR CERVICAL DECOMPRESSION/DISCECTOMY FUSION 1 LEVEL;  Surgeon: Lucy Chris, MD;  Location: [facility] ORS;  Service: Neurosurgery;  Laterality: N/A;   TUBAL LIGATION     ulna nerve decompression Left     Social History   Socioeconomic History   Marital status: Divorced    Spouse name: Not on file   Number of children: Not on file   Years of education: Not on file   Highest education level: Not on file  Occupational History   Not on file  Tobacco Use   Smoking status: Never   Smokeless tobacco: Never  Vaping Use   Vaping Use: Never used  Substance and Sexual Activity   Alcohol use: No    Alcohol/week: 0.0 standard drinks of alcohol   Drug use: No   Sexual activity: Not on file  Other Topics Concern   Not on file  Social History Narrative   Not on file   Social Determinants of Health   Financial Resource Strain: Not on file  Food Insecurity: Not on file  Transportation Needs: Not on file  Physical Activity: Not on file  Stress: Not on file  Social Connections: Not on file  Intimate Partner Violence: Not on file    Family History  Problem Relation Age of Onset   Breast cancer Paternal Grandmother     No Known Allergies  Review of Systems  Constitutional: Negative.   HENT: Negative.     Eyes: Negative.   Respiratory: Negative.    Cardiovascular: Negative.   Gastrointestinal: Negative.   Genitourinary: Negative.   Musculoskeletal:  Positive for back pain and myalgias.  Skin: Negative.   Neurological: Negative.   Endo/Heme/Allergies: Negative.   Psychiatric/Behavioral: Negative.         Objective:   BP 130/80   Pulse (!) 108   Ht  (1.575 m)   Wt 285 lb 6.4 oz (129.5 kg)   SpO2 97%   BMI 52.20 kg/m   Vitals:   05/24/22 1125  BP: 130/80  Pulse: (!) 108  Height:  (1.575 m)  Weight: 285 lb 6.4 oz (129.5 kg)  SpO2: 97%  BMI (Calculated): 52.19    Physical Exam Vitals reviewed.  Constitutional:      Appearance: Normal appearance.  HENT:     Head: Normocephalic.     Nose: Nose normal.     Mouth/Throat:     Mouth: Mucous membranes are moist.  Eyes:     Pupils: Pupils are equal, round, and reactive to light.  Cardiovascular:     Rate and Rhythm: Normal rate and regular rhythm.  Pulmonary:     Effort: Pulmonary effort is normal.     Breath sounds:  Normal breath sounds.  Abdominal:     General: Bowel sounds are normal.     Palpations: Abdomen is soft.  Musculoskeletal:        General: Normal range of motion.     Cervical back: Normal range of motion and neck supple.  Skin:    General: Skin is warm and dry.  Neurological:     Mental Status: She is alert and oriented to person, place, and time.  Psychiatric:        Mood and Affect: Mood normal.        Behavior: Behavior normal.      No results found for any visits on 05/24/22.     Assessment & Plan:   Problem List Items Addressed This Visit       Respiratory   Sleep apnea     Endocrine   Primary hypothyroidism   Diabetes mellitus - Primary     Other   Overweight    Return in about 4 months (around 09/23/2022).   Total time spent: 35 minutes  Orson Eva, NP  05/24/2022

## 2022-05-25 ENCOUNTER — Encounter: Payer: Self-pay | Admitting: Gastroenterology

## 2022-05-26 ENCOUNTER — Other Ambulatory Visit: Payer: Self-pay | Admitting: Nurse Practitioner

## 2022-05-26 ENCOUNTER — Encounter: Payer: Self-pay | Admitting: Nurse Practitioner

## 2022-05-26 ENCOUNTER — Ambulatory Visit (INDEPENDENT_AMBULATORY_CARE_PROVIDER_SITE_OTHER): Payer: 59 | Admitting: Nurse Practitioner

## 2022-05-26 VITALS — BP 130/90 | HR 99 | Ht 62.0 in | Wt 286.6 lb

## 2022-05-26 DIAGNOSIS — E119 Type 2 diabetes mellitus without complications: Secondary | ICD-10-CM

## 2022-05-26 DIAGNOSIS — E1169 Type 2 diabetes mellitus with other specified complication: Secondary | ICD-10-CM

## 2022-05-26 DIAGNOSIS — E039 Hypothyroidism, unspecified: Secondary | ICD-10-CM | POA: Diagnosis not present

## 2022-05-26 DIAGNOSIS — M542 Cervicalgia: Secondary | ICD-10-CM

## 2022-05-26 LAB — GLUCOSE, POCT (MANUAL RESULT ENTRY): POC Glucose: 97 mg/dl (ref 70–99)

## 2022-05-26 MED ORDER — MOUNJARO 2.5 MG/0.5ML ~~LOC~~ SOAJ
2.5000 mg | SUBCUTANEOUS | 3 refills | Status: DC
Start: 2022-05-26 — End: 2022-05-31

## 2022-05-26 NOTE — Progress Notes (Unsigned)
Established Patient Office Visit  Subjective:  Patient ID: Leslie Meadows, female    DOB: 01/04/1971  Age: 52 y.o. MRN: [id number]  Chief Complaint  Patient presents with   Follow-up    Med check    Med check    No other concerns at this time.   Past Medical History:  Diagnosis Date   Diabetes mellitus without complication    HLD (hyperlipidemia)    Morbid obesity with BMI of 50.0-59.9, adult    Sleep apnea    Thyroid disease     Past Surgical History:  Procedure Laterality Date   ABDOMINAL HYSTERECTOMY     ANTERIOR CERVICAL DECOMP/DISCECTOMY FUSION N/A 10/27/2020   Procedure: C4-5 ANTERIOR CERVICAL DECOMPRESSION/DISCECTOMY FUSION 1 LEVEL;  Surgeon: Lucy Chris, MD;  Location: [facility];  Service: Neurosurgery;  Laterality: N/A;   COLONOSCOPY WITH PROPOFOL N/A 05/21/2022   Procedure: COLONOSCOPY WITH PROPOFOL;  Surgeon: Regis Bill, MD;  Location: [facility];  Service: Endoscopy;  Laterality: N/A;  DM   TUBAL LIGATION     ulna nerve decompression Left     Social History   Socioeconomic History   Marital status: Divorced    Spouse name: Not on file   Number of children: Not on file   Years of education: Not on file   Highest education level: Not on file  Occupational History   Not on file  Tobacco Use   Smoking status: Never   Smokeless tobacco: Never  Vaping Use   Vaping Use: Never used  Substance and Sexual Activity   Alcohol use: No    Alcohol/week: 0.0 standard drinks of alcohol   Drug use: No   Sexual activity: Not on file  Other Topics Concern   Not on file  Social History Narrative   Not on file   Social Determinants of Health   Financial Resource Strain: Not on file  Food Insecurity: Not on file  Transportation Needs: Not on file  Physical Activity: Not on file  Stress: Not on file  Social Connections: Not on file  Intimate Partner Violence: Not on file    Family History  Problem Relation Age of Onset   Breast cancer  Paternal Grandmother     No Known Allergies  Review of Systems  Constitutional: Negative.   HENT: Negative.    Eyes: Negative.   Cardiovascular: Negative.   Gastrointestinal: Negative.   Genitourinary: Negative.   Musculoskeletal:  Positive for joint pain and myalgias.  Skin: Negative.   Neurological: Negative.   Endo/Heme/Allergies: Negative.   Psychiatric/Behavioral: Negative.         Objective:   There were no vitals taken for this visit.  There were no vitals filed for this visit.  Physical Exam Vitals reviewed.  Constitutional:      Appearance: Normal appearance.  HENT:     Head: Normocephalic.     Nose: Nose normal.     Mouth/Throat:     Mouth: Mucous membranes are moist.  Eyes:     Pupils: Pupils are equal, round, and reactive to light.  Cardiovascular:     Rate and Rhythm: Normal rate and regular rhythm.  Pulmonary:     Effort: Pulmonary effort is normal.     Breath sounds: Normal breath sounds.  Abdominal:     General: Bowel sounds are normal.     Palpations: Abdomen is soft.  Musculoskeletal:        General: Normal range of motion.     Cervical back:  Normal range of motion and neck supple.  Skin:    General: Skin is warm and dry.  Neurological:     Mental Status: She is alert and oriented to person, place, and time.  Psychiatric:        Mood and Affect: Mood normal.        Behavior: Behavior normal.      No results found for any visits on 05/26/22.  Recent Results (from the past 2160 hour(s))  CBC With Differential     Status: Abnormal   Collection Time: 05/20/22  1:19 PM  Result Value Ref Range   WBC 3.3 (L) 3.4 - 10.8 x10E3/uL   RBC 5.28 3.77 - 5.28 x10E6/uL   Hemoglobin 14.5 11.1 - 15.9 g/dL   Hematocrit 32.4 40.1 - 46.6 %   MCV 84 79 - 97 fL   MCH 27.5 26.6 - 33.0 pg   MCHC 32.9 31.5 - 35.7 g/dL   RDW 02.7 25.3 - 66.4 %   Neutrophils 47 Not Estab. %   Lymphs 42 Not Estab. %   Monocytes 8 Not Estab. %   Eos 1 Not Estab. %    Basos 1 Not Estab. %   Neutrophils Absolute 1.6 1.4 - 7.0 x10E3/uL   Lymphocytes Absolute 1.4 0.7 - 3.1 x10E3/uL   Monocytes Absolute 0.3 0.1 - 0.9 x10E3/uL   EOS (ABSOLUTE) 0.0 0.0 - 0.4 x10E3/uL   Basophils Absolute 0.0 0.0 - 0.2 x10E3/uL   Immature Granulocytes 1 Not Estab. %   Immature Grans (Abs) 0.0 0.0 - 0.1 x10E3/uL  CMP14+EGFR     Status: Abnormal   Collection Time: 05/20/22  1:19 PM  Result Value Ref Range   Glucose 94 70 - 99 mg/dL   BUN 10 6 - 24 mg/dL   Creatinine, Ser 4.03 0.57 - 1.00 mg/dL   eGFR 82 >47 QQ/VZD/6.38   BUN/Creatinine Ratio 12 9 - 23   Sodium 140 134 - 144 mmol/L   Potassium 4.3 3.5 - 5.2 mmol/L   Chloride 103 96 - 106 mmol/L   CO2 23 20 - 29 mmol/L   Calcium 9.8 8.7 - 10.2 mg/dL   Total Protein 7.4 6.0 - 8.5 g/dL   Albumin 4.6 3.8 - 4.9 g/dL   Globulin, Total 2.8 1.5 - 4.5 g/dL   Albumin/Globulin Ratio 1.6 1.2 - 2.2   Bilirubin Total 0.6 0.0 - 1.2 mg/dL   Alkaline Phosphatase 67 44 - 121 IU/L   AST 32 0 - 40 IU/L   ALT 38 (H) 0 - 32 IU/L  Hemoglobin A1c     Status: Abnormal   Collection Time: 05/20/22  1:19 PM  Result Value Ref Range   Hgb A1c MFr Bld 6.6 (H) 4.8 - 5.6 %    Comment:          Prediabetes: 5.7 - 6.4          Diabetes: >6.4          Glycemic control for adults with diabetes: <7.0    Est. average glucose Bld gHb Est-mCnc 143 mg/dL  Lipid panel     Status: None   Collection Time: 05/20/22  1:19 PM  Result Value Ref Range   Cholesterol, Total 143 100 - 199 mg/dL   Triglycerides 74 0 - 149 mg/dL   HDL 44 >75 mg/dL   VLDL Cholesterol Cal 15 5 - 40 mg/dL   LDL Chol Calc (NIH) 84 0 - 99 mg/dL   Chol/HDL Ratio 3.3 0.0 - 4.4  ratio    Comment:                                   T. Chol/HDL Ratio                                             Men  Women                               1/2 Avg.Risk  3.4    3.3                                   Avg.Risk  5.0    4.4                                2X Avg.Risk  9.6    7.1                                 3X Avg.Risk 23.4   11.0   TSH     Status: None   Collection Time: 05/20/22  1:19 PM  Result Value Ref Range   TSH 1.670 0.450 - 4.500 uIU/mL  Glucose, capillary     Status: Abnormal   Collection Time: 05/21/22  9:07 AM  Result Value Ref Range   Glucose-Capillary 116 (H) 70 - 99 mg/dL    Comment: Glucose reference range applies only to samples taken after fasting for at least 8 hours.      Assessment & Plan:   Problem List Items Addressed This Visit       Endocrine   Primary hypothyroidism - Primary   Diabetes mellitus     Other   Obesity, unspecified   Neck pain    No follow-ups on file.   Total time spent: {AMA time spent:29001} minutes  Orson Eva, NP  05/26/2022

## 2022-05-26 NOTE — Patient Instructions (Signed)
1 

## 2022-05-27 DIAGNOSIS — M722 Plantar fascial fibromatosis: Secondary | ICD-10-CM | POA: Diagnosis not present

## 2022-05-27 DIAGNOSIS — E119 Type 2 diabetes mellitus without complications: Secondary | ICD-10-CM | POA: Diagnosis not present

## 2022-05-28 ENCOUNTER — Other Ambulatory Visit: Payer: Self-pay | Admitting: Nurse Practitioner

## 2022-05-28 DIAGNOSIS — E119 Type 2 diabetes mellitus without complications: Secondary | ICD-10-CM

## 2022-06-01 ENCOUNTER — Other Ambulatory Visit: Payer: Self-pay | Admitting: Nurse Practitioner

## 2022-06-01 DIAGNOSIS — E119 Type 2 diabetes mellitus without complications: Secondary | ICD-10-CM

## 2022-06-03 ENCOUNTER — Other Ambulatory Visit: Payer: Self-pay | Admitting: Nurse Practitioner

## 2022-06-03 DIAGNOSIS — E1165 Type 2 diabetes mellitus with hyperglycemia: Secondary | ICD-10-CM

## 2022-06-03 MED ORDER — TRULICITY 0.75 MG/0.5ML ~~LOC~~ SOAJ
0.7500 mg | SUBCUTANEOUS | 3 refills | Status: DC
Start: 2022-06-03 — End: 2022-07-26

## 2022-06-08 ENCOUNTER — Telehealth: Payer: Self-pay | Admitting: Internal Medicine

## 2022-06-08 NOTE — Telephone Encounter (Signed)
Patient called inquiring if she is supposed to be taking Trulicity and Jardiance. Advised her yes she is. She states that her pharmacy does not have the Jardiance. It was sent to [facility] on 4/15, should[facility] [place]. Advised patient to reach out to pharmacy to have them transfer the Rx.

## 2022-06-14 ENCOUNTER — Telehealth: Payer: Self-pay

## 2022-06-14 NOTE — Telephone Encounter (Signed)
Patient LM asking for call back 

## 2022-06-15 ENCOUNTER — Telehealth: Payer: Self-pay | Admitting: Internal Medicine

## 2022-06-15 NOTE — Telephone Encounter (Signed)
-----   Message from Feliberto Harts, CNA sent at 06/15/2022 12:27 PM EDT -----  ----- Message ----- From: Feliberto Harts, CNA Sent: 06/09/2022   1:46 PM EDT To: Orson Eva, NP  She wanted you to send in glucose testing supplies so she can get it through her insurance. [facility] [place].  Please advise.

## 2022-06-15 NOTE — Telephone Encounter (Signed)
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 Please [facility] [address].

## 2022-06-16 ENCOUNTER — Other Ambulatory Visit: Payer: Self-pay | Admitting: Nurse Practitioner

## 2022-06-16 MED ORDER — ACCU-CHEK SOFTCLIX LANCETS MISC: 12 refills | Status: AC

## 2022-06-16 MED ORDER — ACCU-CHEK GUIDE VI STRP: ORAL_STRIP | 6 refills | Status: DC

## 2022-06-17 ENCOUNTER — Other Ambulatory Visit: Payer: Self-pay

## 2022-06-17 ENCOUNTER — Emergency Department: Payer: 59

## 2022-06-17 ENCOUNTER — Encounter: Payer: Self-pay | Admitting: Emergency Medicine

## 2022-06-17 ENCOUNTER — Emergency Department
Admission: EM | Admit: 2022-06-17 | Discharge: 2022-06-17 | Disposition: A | Discharge status: Home / Self Care | Payer: 59 | Attending: Emergency Medicine | Admitting: Emergency Medicine

## 2022-06-17 DIAGNOSIS — S0990XA Unspecified injury of head, initial encounter: Secondary | ICD-10-CM | POA: Insufficient documentation

## 2022-06-17 DIAGNOSIS — M542 Cervicalgia: Secondary | ICD-10-CM | POA: Diagnosis not present

## 2022-06-17 DIAGNOSIS — R519 Headache, unspecified: Secondary | ICD-10-CM | POA: Diagnosis not present

## 2022-06-17 DIAGNOSIS — Y92481 Parking lot as the place of occurrence of the external cause: Secondary | ICD-10-CM | POA: Diagnosis not present

## 2022-06-17 DIAGNOSIS — I10 Essential (primary) hypertension: Secondary | ICD-10-CM | POA: Diagnosis not present

## 2022-06-17 DIAGNOSIS — M47812 Spondylosis without myelopathy or radiculopathy, cervical region: Secondary | ICD-10-CM | POA: Diagnosis not present

## 2022-06-17 MED ORDER — OXYCODONE-ACETAMINOPHEN 5-325 MG PO TABS
1.0000 | ORAL_TABLET | Freq: Once | ORAL | Status: AC
  Administered 2022-06-17: 1 via ORAL
  Filled 2022-06-17: qty 1

## 2022-06-17 MED ORDER — CYCLOBENZAPRINE HCL 10 MG PO TABS: 10.0000 mg | ORAL_TABLET | Freq: Three times a day (TID) | ORAL | 0 refills | Status: AC | PRN

## 2022-06-17 MED ORDER — IBUPROFEN 600 MG PO TABS
600.0000 mg | ORAL_TABLET | Freq: Once | ORAL | Status: AC
  Administered 2022-06-17: 600 mg via ORAL
  Filled 2022-06-17: qty 1
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
## 2022-06-23 ENCOUNTER — Telehealth: Payer: Self-pay | Admitting: *Deleted

## 2022-06-23 NOTE — Progress Notes (Signed)
  Care Coordination  Outreach Note  06/23/2022 Name: Leslie Meadows MRN: [id number] DOB: [birth year]   Care Coordination Outreach Attempts: An unsuccessful telephone outreach was attempted today to offer the patient information about available care coordination services.  Follow Up Plan:  Additional outreach attempts will be made to offer the patient care coordination information and services.   Encounter Outcome:  No Answer  Gwenevere Ghazi  Care Coordination Care Guide  Direct Dial: [phone]

## 2022-06-23 NOTE — Progress Notes (Signed)
  Care Coordination   Note   06/23/2022 Name: CHANNIE LADD MRN: [id number] DOB: [birth year]  AYLEEN AKARD is a 52 y.o. year old female who sees Margaretann Loveless, MD for primary care. I reached out to Alric Ran by phone today to offer care coordination services.  Ms. Allinder was given information about Care Coordination services today including:   The Care Coordination services include support from the care team which includes your Nurse Coordinator, Clinical Social Worker, or Pharmacist.  The Care Coordination team is here to help remove barriers to the health concerns and goals most important to you. Care Coordination services are voluntary, and the patient may decline or stop services at any time by request to their care team member.   Care Coordination Consent Status: Patient agreed to services and verbal consent obtained.   Follow up plan:  Telephone appointment with care coordination team member scheduled for:  06/30/22  Encounter Outcome:  Pt. Scheduled  [facility] Coordination Care Guide  Direct Dial: [phone]

## 2022-06-28 ENCOUNTER — Telehealth: Payer: Self-pay | Admitting: *Deleted

## 2022-06-28 ENCOUNTER — Encounter: Payer: Self-pay | Admitting: *Deleted

## 2022-06-28 NOTE — Patient Outreach (Signed)
  Care Coordination   06/28/2022 Name: Leslie Meadows MRN: [id number] DOB: Jun 03, 1970   Care Coordination Outreach Attempts:  An unsuccessful telephone outreach was attempted for a scheduled appointment today.  Follow Up Plan:  Additional outreach attempts will be made to offer the patient care coordination information and services.   Encounter Outcome:  No Answer   Care Coordination Interventions:  No, not indicated    Venus Gilles, LCSW Clinical Social Worker  [facility] Care Management [phone]

## 2022-06-30 ENCOUNTER — Encounter: Payer: 59 | Admitting: *Deleted

## 2022-07-01 DIAGNOSIS — E119 Type 2 diabetes mellitus without complications: Secondary | ICD-10-CM | POA: Diagnosis not present

## 2022-07-01 DIAGNOSIS — M722 Plantar fascial fibromatosis: Secondary | ICD-10-CM | POA: Diagnosis not present

## 2022-07-16 ENCOUNTER — Telehealth: Payer: Self-pay | Admitting: *Deleted

## 2022-07-16 NOTE — Patient Outreach (Signed)
  Care Coordination   Care Coordination  Visit Note   07/16/2022 Name: AMALIYA WHITELAW MRN: [id number] DOB: 07/01/1970  SHANQUITA RONNING is a 52 y.o. year old female who sees Margaretann Loveless, MD for primary care. I spoke with  Alric Ran by phone today.  What matters to the patients health and wellness today?  Initial intake re-scheduled for 07/21/22 at 9am    Goals Addressed             This Visit's Progress    COMPLETED: care coordination activities       Care Coordination Interventions: Patient returning call to re-schedule initial appointment previously scheduled for 06/28/22 [facility] care management program explained, appt rescheduled for 07/21/22           SDOH assessments and interventions completed:  No     Care Coordination Interventions:  Yes, provided   Follow up plan: Follow up call scheduled for 07/21/22    Encounter Outcome:  Pt. Visit Completed

## 2022-07-21 ENCOUNTER — Encounter: Payer: Self-pay | Admitting: *Deleted

## 2022-07-21 ENCOUNTER — Telehealth: Payer: Self-pay | Admitting: *Deleted

## 2022-07-21 NOTE — Patient Outreach (Signed)
  Care Coordination   07/21/2022 Name: Leslie Meadows MRN: [id number] DOB: [birth year]   Care Coordination Outreach Attempts:  An unsuccessful telephone outreach was attempted for a scheduled appointment today.  Follow Up Plan:  Additional outreach attempts will be made to offer the patient care coordination information and services.   Encounter Outcome:  Pt. Visit Completed   Care Coordination Interventions:  No, not indicated    Kimesha Claxton, LCSW Clinical Social Worker  [facility] Care Management [phone]

## 2022-07-23 ENCOUNTER — Ambulatory Visit: Payer: 59 | Admitting: Internal Medicine

## 2022-07-23 ENCOUNTER — Ambulatory Visit: Payer: 59 | Admitting: Nurse Practitioner

## 2022-07-26 ENCOUNTER — Ambulatory Visit (INDEPENDENT_AMBULATORY_CARE_PROVIDER_SITE_OTHER): Payer: 59 | Admitting: Internal Medicine

## 2022-07-26 ENCOUNTER — Encounter: Payer: Self-pay | Admitting: Internal Medicine

## 2022-07-26 VITALS — BP 110/76 | HR 91 | Ht 62.0 in | Wt 281.8 lb

## 2022-07-26 DIAGNOSIS — E782 Mixed hyperlipidemia: Secondary | ICD-10-CM

## 2022-07-26 DIAGNOSIS — E1169 Type 2 diabetes mellitus with other specified complication: Secondary | ICD-10-CM | POA: Diagnosis not present

## 2022-07-26 DIAGNOSIS — I10 Essential (primary) hypertension: Secondary | ICD-10-CM | POA: Diagnosis not present

## 2022-07-26 DIAGNOSIS — E039 Hypothyroidism, unspecified: Secondary | ICD-10-CM

## 2022-07-26 DIAGNOSIS — Z713 Dietary counseling and surveillance: Secondary | ICD-10-CM | POA: Insufficient documentation

## 2022-07-26 DIAGNOSIS — Z1231 Encounter for screening mammogram for malignant neoplasm of breast: Secondary | ICD-10-CM | POA: Insufficient documentation

## 2022-07-26 DIAGNOSIS — G4730 Sleep apnea, unspecified: Secondary | ICD-10-CM

## 2022-07-26 LAB — POC CREATINE & ALBUMIN,URINE
Albumin/Creatinine Ratio, Urine, POC: <30
Creatinine, POC: 200 mg/dL
Microalbumin Ur, POC: 30 mg/L

## 2022-07-26 LAB — POCT URINALYSIS DIPSTICK
Bilirubin, UA: NEGATIVE
Blood, UA: NEGATIVE
Glucose, UA: POSITIVE — AB
Ketones, UA: NEGATIVE
Leukocytes, UA: NEGATIVE
Nitrite, UA: NEGATIVE
Protein, UA: NEGATIVE
Spec Grav, UA: 1.025 (ref 1.010–1.025)
Urobilinogen, UA: 0.2 E.U./dL
pH, UA: 5 (ref 5.0–8.0)

## 2022-07-26 MED ORDER — LISINOPRIL 2.5 MG PO TABS
2.5000 mg | ORAL_TABLET | Freq: Every day | ORAL | 11 refills | Status: DC
Start: 2022-07-26 — End: 2022-08-10

## 2022-07-26 MED ORDER — TRULICITY 1.5 MG/0.5ML ~~LOC~~ SOAJ
1.5000 mg | SUBCUTANEOUS | 0 refills | Status: DC
Start: 2022-07-26 — End: 2022-08-10

## 2022-07-26 MED ORDER — LISINOPRIL 20 MG PO TABS
20.0000 mg | ORAL_TABLET | Freq: Every day | ORAL | 11 refills | Status: DC
Start: 2022-07-26 — End: 2022-07-26

## 2022-07-26 NOTE — Progress Notes (Signed)
Established Patient Office Visit  Subjective:  Patient ID: Leslie Meadows, female    DOB: April 09, 1970  Age: 52 y.o. MRN: [id number]  Chief Complaint  Patient presents with   Hospitalization Follow-up    Hospital follow up    Patient in office for regular follow up.She has been using Trulicity .75 mg /week and lost weight. Denies nausea, vomiting or constipation, so tolerating  it well. Will increase to 1.5 mg/week. Patient is tired today as did not wear her CPAP mask last night- advised to use it regularly. Needs mammogram- will order. Also check urine Microalb- today. Add ACE-I - will send in Lisinopril 2.5 mg/d. Patient will make her own eye appointment. Patient was told there was blood in her urine- but repeat u/a today is clear. Patient will return for fasting labs.    No other concerns at this time.   Past Medical History:  Diagnosis Date   Diabetes mellitus without complication (HCC)    HLD (hyperlipidemia)    Morbid obesity with BMI of 50.0-59.9, adult (HCC)    Sleep apnea    Thyroid disease     Past Surgical History:  Procedure Laterality Date   ABDOMINAL HYSTERECTOMY     ANTERIOR CERVICAL DECOMP/DISCECTOMY FUSION N/A 10/27/2020   Procedure: C4-5 ANTERIOR CERVICAL DECOMPRESSION/DISCECTOMY FUSION 1 LEVEL;  Surgeon: Lucy Chris, MD;  Location: [facility];  Service: Neurosurgery;  Laterality: N/A;   COLONOSCOPY WITH PROPOFOL N/A 05/21/2022   Procedure: COLONOSCOPY WITH PROPOFOL;  Surgeon: Regis Bill, MD;  Location: [facility];  Service: Endoscopy;  Laterality: N/A;  DM   TUBAL LIGATION     ulna nerve decompression Left     Social History   Socioeconomic History   Marital status: Divorced    Spouse name: Not on file   Number of children: Not on file   Years of education: Not on file   Highest education level: Not on file  Occupational History   Not on file  Tobacco Use   Smoking status: Never   Smokeless tobacco: Never  Vaping Use   Vaping  Use: Never used  Substance and Sexual Activity   Alcohol use: No    Alcohol/week: 0.0 standard drinks of alcohol   Drug use: No   Sexual activity: Not on file  Other Topics Concern   Not on file  Social History Narrative   Not on file   Social Determinants of Health   Financial Resource Strain: Not on file  Food Insecurity: Not on file  Transportation Needs: Not on file  Physical Activity: Not on file  Stress: Not on file  Social Connections: Not on file  Intimate Partner Violence: Not on file    Family History  Problem Relation Age of Onset   Breast cancer Paternal Grandmother     No Known Allergies  Review of Systems  Constitutional: Negative.  Negative for chills, diaphoresis, fever, malaise/fatigue and weight loss.  HENT:  Negative for congestion, ear pain, hearing loss, sinus pain, sore throat and tinnitus.   Eyes:  Negative for blurred vision, double vision, photophobia, discharge and redness.  Respiratory: Negative.  Negative for cough, hemoptysis, sputum production, shortness of breath, wheezing and stridor.   Cardiovascular: Negative.  Negative for chest pain, palpitations, orthopnea, claudication, leg swelling and PND.  Gastrointestinal: Negative.  Negative for abdominal pain, blood in stool, constipation, diarrhea, heartburn, melena, nausea and vomiting.  Genitourinary:  Positive for dysuria. Negative for flank pain, frequency, hematuria and urgency.  Musculoskeletal:  Negative  for back pain, falls, joint pain and myalgias.  Neurological:  Negative for dizziness, focal weakness, seizures, weakness and headaches.  Psychiatric/Behavioral:  Negative for substance abuse and suicidal ideas. The patient is not nervous/anxious and does not have insomnia.        Objective:   BP 110/76   Pulse 91   Ht 5\' 2"  (1.575 m)   Wt 281 lb 12.8 oz (127.8 kg)   SpO2 95%   BMI 51.54 kg/m   Vitals:   07/26/22 1101  BP: 110/76  Pulse: 91  Height: 5\' 2"  (1.575 m)  Weight:  281 lb 12.8 oz (127.8 kg)  SpO2: 95%  BMI (Calculated): 51.53    Physical Exam Vitals and nursing note reviewed.  Constitutional:      General: She is not in acute distress.    Appearance: Normal appearance. She is obese.  Eyes:     General:        Right eye: No discharge.        Left eye: No discharge.  Cardiovascular:     Rate and Rhythm: Normal rate and regular rhythm.     Pulses: Normal pulses.     Heart sounds: Normal heart sounds. No murmur heard. Pulmonary:     Effort: Pulmonary effort is normal.     Breath sounds: Normal breath sounds. No wheezing, rhonchi or rales.  Chest:     Chest wall: No tenderness.  Abdominal:     Palpations: Abdomen is soft.     Tenderness: There is no right CVA tenderness or left CVA tenderness.  Musculoskeletal:        General: Normal range of motion.     Cervical back: Normal range of motion and neck supple.     Right lower leg: No edema.     Left lower leg: No edema.  Skin:    General: Skin is warm and dry.     Coloration: Skin is not jaundiced.  Neurological:     General: No focal deficit present.     Mental Status: She is alert and oriented to person, place, and time.  Psychiatric:        Mood and Affect: Mood normal.        Behavior: Behavior normal.      Results for orders placed or performed in visit on 07/26/22  POCT Urinalysis Dipstick (81002)  Result Value Ref Range   Color, UA yellow    Clarity, UA clear    Glucose, UA Positive (A) Negative   Bilirubin, UA neg    Ketones, UA neg    Spec Grav, UA 1.025 1.010 - 1.025   Blood, UA neg    pH, UA 5.0 5.0 - 8.0   Protein, UA Negative Negative   Urobilinogen, UA 0.2 0.2 or 1.0 E.U./dL   Nitrite, UA neg    Leukocytes, UA Negative Negative   Appearance clear    Odor yes   POC CREATINE & ALBUMIN,URINE  Result Value Ref Range   Microalbumin Ur, POC 30 mg/L   Creatinine, POC 200 mg/dL   Albumin/Creatinine Ratio, Urine, POC <30     Recent Results (from the past 2160  hour(s))  CBC With Differential     Status: Abnormal   Collection Time: 05/20/22  1:19 PM  Result Value Ref Range   WBC 3.3 (L) 3.4 - 10.8 x10E3/uL   RBC 5.28 3.77 - 5.28 x10E6/uL   Hemoglobin 14.5 11.1 - 15.9 g/dL   Hematocrit 16.1 09.6 - 46.6 %  MCV 84 79 - 97 fL   MCH 27.5 26.6 - 33.0 pg   MCHC 32.9 31.5 - 35.7 g/dL   RDW 40.9 81.1 - 91.4 %   Neutrophils 47 Not Estab. %   Lymphs 42 Not Estab. %   Monocytes 8 Not Estab. %   Eos 1 Not Estab. %   Basos 1 Not Estab. %   Neutrophils Absolute 1.6 1.4 - 7.0 x10E3/uL   Lymphocytes Absolute 1.4 0.7 - 3.1 x10E3/uL   Monocytes Absolute 0.3 0.1 - 0.9 x10E3/uL   EOS (ABSOLUTE) 0.0 0.0 - 0.4 x10E3/uL   Basophils Absolute 0.0 0.0 - 0.2 x10E3/uL   Immature Granulocytes 1 Not Estab. %   Immature Grans (Abs) 0.0 0.0 - 0.1 x10E3/uL  CMP14+EGFR     Status: Abnormal   Collection Time: 05/20/22  1:19 PM  Result Value Ref Range   Glucose 94 70 - 99 mg/dL   BUN 10 6 - 24 mg/dL   Creatinine, Ser 7.82 0.57 - 1.00 mg/dL   eGFR 82 >95 AO/ZHY/8.65   BUN/Creatinine Ratio 12 9 - 23   Sodium 140 134 - 144 mmol/L   Potassium 4.3 3.5 - 5.2 mmol/L   Chloride 103 96 - 106 mmol/L   CO2 23 20 - 29 mmol/L   Calcium 9.8 8.7 - 10.2 mg/dL   Total Protein 7.4 6.0 - 8.5 g/dL   Albumin 4.6 3.8 - 4.9 g/dL   Globulin, Total 2.8 1.5 - 4.5 g/dL   Albumin/Globulin Ratio 1.6 1.2 - 2.2   Bilirubin Total 0.6 0.0 - 1.2 mg/dL   Alkaline Phosphatase 67 44 - 121 IU/L   AST 32 0 - 40 IU/L   ALT 38 (H) 0 - 32 IU/L  Hemoglobin A1c     Status: Abnormal   Collection Time: 05/20/22  1:19 PM  Result Value Ref Range   Hgb A1c MFr Bld 6.6 (H) 4.8 - 5.6 %    Comment:          Prediabetes: 5.7 - 6.4          Diabetes: >6.4          Glycemic control for adults with diabetes: <7.0    Est. average glucose Bld gHb Est-mCnc 143 mg/dL  Lipid panel     Status: None   Collection Time: 05/20/22  1:19 PM  Result Value Ref Range   Cholesterol, Total 143 100 - 199 mg/dL    Triglycerides 74 0 - 149 mg/dL   HDL 44 >78 mg/dL   VLDL Cholesterol Cal 15 5 - 40 mg/dL   LDL Chol Calc (NIH) 84 0 - 99 mg/dL   Chol/HDL Ratio 3.3 0.0 - 4.4 ratio    Comment:                                   T. Chol/HDL Ratio                                             Men  Women                               1/2 Avg.Risk  3.4    3.3  Avg.Risk  5.0    4.4                                2X Avg.Risk  9.6    7.1                                3X Avg.Risk 23.4   11.0   TSH     Status: None   Collection Time: 05/20/22  1:19 PM  Result Value Ref Range   TSH 1.670 0.450 - 4.500 uIU/mL  Glucose, capillary     Status: Abnormal   Collection Time: 05/21/22  9:07 AM  Result Value Ref Range   Glucose-Capillary 116 (H) 70 - 99 mg/dL    Comment: Glucose reference range applies only to samples taken after fasting for at least 8 hours.  POCT Glucose (CBG)     Status: Abnormal   Collection Time: 05/26/22 11:41 AM  Result Value Ref Range   POC Glucose 97 70 - 99 mg/dl  POCT Urinalysis Dipstick (16109)     Status: Abnormal   Collection Time: 07/26/22 11:32 AM  Result Value Ref Range   Color, UA yellow    Clarity, UA clear    Glucose, UA Positive (A) Negative   Bilirubin, UA neg    Ketones, UA neg    Spec Grav, UA 1.025 1.010 - 1.025   Blood, UA neg    pH, UA 5.0 5.0 - 8.0   Protein, UA Negative Negative   Urobilinogen, UA 0.2 0.2 or 1.0 E.U./dL   Nitrite, UA neg    Leukocytes, UA Negative Negative   Appearance clear    Odor yes   POC CREATINE & ALBUMIN,URINE     Status: None   Collection Time: 07/26/22 11:33 AM  Result Value Ref Range   Microalbumin Ur, POC 30 mg/L   Creatinine, POC 200 mg/dL   Albumin/Creatinine Ratio, Urine, POC <30       Assessment & Plan:  Increase Trulicity to 1.5 mg/week. Add Lisinopril 2.5 mg/d. Get labs before next visit.  Problem List Items Addressed This Visit     Obesity, unspecified   Relevant Medications    Dulaglutide (TRULICITY) 1.5 MG/0.5ML SOPN   HLD (hyperlipidemia)   Relevant Medications   lisinopril (ZESTRIL) 2.5 MG tablet   Other Relevant Orders   Lipid Panel w/o Chol/HDL Ratio   Primary hypothyroidism   Relevant Orders   TSH+T4F+T3Free   Sleep apnea   Relevant Orders   CBC With Differential   Diabetes mellitus (HCC) - Primary   Relevant Medications   Dulaglutide (TRULICITY) 1.5 MG/0.5ML SOPN   lisinopril (ZESTRIL) 2.5 MG tablet   Other Relevant Orders   POCT Urinalysis Dipstick (81002) (Completed)   POC CREATINE & ALBUMIN,URINE (Completed)   Hemoglobin A1c   Essential hypertension, benign   Relevant Medications   lisinopril (ZESTRIL) 2.5 MG tablet   Other Relevant Orders   CMP14+EGFR   Breast cancer screening by mammogram   Relevant Orders   MM 3D SCREENING MAMMOGRAM BILATERAL BREAST    Return in about 1 month (around 08/25/2022).   Total time spent: 30 minutes  Margaretann Loveless, MD  07/26/2022   This document may have been prepared by [facility] Voice Recognition software and as such may include unintentional dictation errors.

## 2022-07-27 ENCOUNTER — Other Ambulatory Visit: Payer: Self-pay | Admitting: Nurse Practitioner

## 2022-08-09 ENCOUNTER — Telehealth: Payer: Self-pay

## 2022-08-09 NOTE — Telephone Encounter (Signed)
Patient needs appt for constipation an nausea from med reaction

## 2022-08-10 ENCOUNTER — Ambulatory Visit (INDEPENDENT_AMBULATORY_CARE_PROVIDER_SITE_OTHER): Payer: 59 | Admitting: Internal Medicine

## 2022-08-10 ENCOUNTER — Encounter: Payer: Self-pay | Admitting: Internal Medicine

## 2022-08-10 VITALS — BP 122/96 | HR 92 | Ht 62.0 in | Wt 278.4 lb

## 2022-08-10 DIAGNOSIS — E1169 Type 2 diabetes mellitus with other specified complication: Secondary | ICD-10-CM

## 2022-08-10 DIAGNOSIS — I10 Essential (primary) hypertension: Secondary | ICD-10-CM | POA: Diagnosis not present

## 2022-08-10 DIAGNOSIS — E782 Mixed hyperlipidemia: Secondary | ICD-10-CM | POA: Diagnosis not present

## 2022-08-10 DIAGNOSIS — G4730 Sleep apnea, unspecified: Secondary | ICD-10-CM

## 2022-08-10 DIAGNOSIS — E039 Hypothyroidism, unspecified: Secondary | ICD-10-CM

## 2022-08-10 LAB — POC CREATINE & ALBUMIN,URINE
Albumin/Creatinine Ratio, Urine, POC: <30
Creatinine, POC: 200 mg/dL
Microalbumin Ur, POC: 30 mg/L

## 2022-08-10 LAB — POCT CBG (FASTING - GLUCOSE)-MANUAL ENTRY: Glucose Fasting, POC: 78 mg/dL (ref 70–99)

## 2022-08-10 MED ORDER — LISINOPRIL 5 MG PO TABS
5.0000 mg | ORAL_TABLET | Freq: Every day | ORAL | 3 refills | Status: DC
Start: 2022-08-10 — End: 2022-09-23

## 2022-08-10 MED ORDER — TRULICITY 3 MG/0.5ML ~~LOC~~ SOAJ
3.0000 mg | SUBCUTANEOUS | 3 refills | Status: DC
Start: 2022-08-10 — End: 2022-08-19

## 2022-08-10 NOTE — Progress Notes (Signed)
Established Patient Office Visit  Subjective:  Patient ID: Leslie Meadows, female    DOB: 1971-01-17  Age: 52 y.o. MRN: [id number]  Chief Complaint  Patient presents with   Acute Visit    Constipation & Nausea from meds    Patient comes in for her follow-up today.  She did not get her fasting labs yet but will have her blood drawn today.  She has no new complaints.  She is tolerating her Trulicity injections and has lost some weight.  Will increase up to the next dose. Her blood pressure today is little higher than before.  She is currently on Zestril 2.5 mg, will increase it to 5 mg a day.    No other concerns at this time.   Past Medical History:  Diagnosis Date   Diabetes mellitus without complication (HCC)    HLD (hyperlipidemia)    Morbid obesity with BMI of 50.0-59.9, adult (HCC)    Sleep apnea    Thyroid disease     Past Surgical History:  Procedure Laterality Date   ABDOMINAL HYSTERECTOMY     ANTERIOR CERVICAL DECOMP/DISCECTOMY FUSION N/A 10/27/2020   Procedure: C4-5 ANTERIOR CERVICAL DECOMPRESSION/DISCECTOMY FUSION 1 LEVEL;  Surgeon: Lucy Chris, MD;  Location: [facility];  Service: Neurosurgery;  Laterality: N/A;   COLONOSCOPY WITH PROPOFOL N/A 05/21/2022   Procedure: COLONOSCOPY WITH PROPOFOL;  Surgeon: Regis Bill, MD;  Location: [facility];  Service: Endoscopy;  Laterality: N/A;  DM   TUBAL LIGATION     ulna nerve decompression Left     Social History   Socioeconomic History   Marital status: Divorced    Spouse name: Not on file   Number of children: Not on file   Years of education: Not on file   Highest education level: Not on file  Occupational History   Not on file  Tobacco Use   Smoking status: Never   Smokeless tobacco: Never  Vaping Use   Vaping Use: Never used  Substance and Sexual Activity   Alcohol use: No    Alcohol/week: 0.0 standard drinks of alcohol   Drug use: No   Sexual activity: Not on file  Other Topics Concern    Not on file  Social History Narrative   Not on file   Social Determinants of Health   Financial Resource Strain: Not on file  Food Insecurity: Not on file  Transportation Needs: Not on file  Physical Activity: Not on file  Stress: Not on file  Social Connections: Not on file  Intimate Partner Violence: Not on file    Family History  Problem Relation Age of Onset   Breast cancer Paternal Grandmother     No Known Allergies  Review of Systems  Constitutional: Negative.   HENT: Negative.    Eyes: Negative.   Respiratory: Negative.  Negative for cough and shortness of breath.   Cardiovascular: Negative.  Negative for chest pain, palpitations and leg swelling.  Gastrointestinal: Negative.  Negative for abdominal pain, constipation, diarrhea, heartburn, nausea and vomiting.  Genitourinary: Negative.  Negative for dysuria and flank pain.  Musculoskeletal: Negative.  Negative for joint pain and myalgias.  Skin: Negative.   Neurological: Negative.  Negative for dizziness and headaches.  Endo/Heme/Allergies: Negative.   Psychiatric/Behavioral: Negative.  Negative for depression and suicidal ideas. The patient is not nervous/anxious.        Objective:   BP (!) 122/96   Pulse 92   Ht 5\' 2"  (1.575 m)   Wt 278  lb 6.4 oz (126.3 kg)   SpO2 98%   BMI 50.92 kg/m   Vitals:   08/10/22 1002  BP: (!) 122/96  Pulse: 92  Height: 5\' 2"  (1.575 m)  Weight: 278 lb 6.4 oz (126.3 kg)  SpO2: 98%  BMI (Calculated): 50.91    Physical Exam Vitals and nursing note reviewed.  Constitutional:      Appearance: Normal appearance.  HENT:     Head: Normocephalic and atraumatic.     Nose: Nose normal.     Mouth/Throat:     Mouth: Mucous membranes are moist.     Pharynx: Oropharynx is clear.  Eyes:     Conjunctiva/sclera: Conjunctivae normal.     Pupils: Pupils are equal, round, and reactive to light.  Cardiovascular:     Rate and Rhythm: Normal rate and regular rhythm.     Pulses:  Normal pulses.     Heart sounds: Normal heart sounds. No murmur heard. Pulmonary:     Effort: Pulmonary effort is normal.     Breath sounds: Normal breath sounds. No wheezing.  Abdominal:     General: Bowel sounds are normal.     Palpations: Abdomen is soft.     Tenderness: There is no abdominal tenderness. There is no right CVA tenderness or left CVA tenderness.  Musculoskeletal:        General: Normal range of motion.     Cervical back: Normal range of motion.     Right lower leg: No edema.     Left lower leg: No edema.  Skin:    General: Skin is warm and dry.  Neurological:     General: No focal deficit present.     Mental Status: She is alert and oriented to person, place, and time.  Psychiatric:        Mood and Affect: Mood normal.        Behavior: Behavior normal.      Results for orders placed or performed in visit on 08/10/22  POCT CBG (Fasting - Glucose)  Result Value Ref Range   Glucose Fasting, POC 78 70 - 99 mg/dL  POC CREATINE & ALBUMIN,URINE  Result Value Ref Range   Microalbumin Ur, POC 30 mg/L   Creatinine, POC 200 mg/dL   Albumin/Creatinine Ratio, Urine, POC <30         Assessment & Plan:  Increase her dose of Zestril to 5 mg/day.  And increase Trulicity to 3 mg/week. Problem List Items Addressed This Visit     HLD (hyperlipidemia)   Relevant Medications   lisinopril (ZESTRIL) 5 MG tablet   Primary hypothyroidism   Sleep apnea   Diabetes mellitus (HCC) - Primary   Relevant Medications   Dulaglutide (TRULICITY) 3 MG/0.5ML SOPN   lisinopril (ZESTRIL) 5 MG tablet   Other Relevant Orders   POCT CBG (Fasting - Glucose) (Completed)   POC CREATINE & ALBUMIN,URINE (Completed)   Essential hypertension, benign   Relevant Medications   lisinopril (ZESTRIL) 5 MG tablet    Return in about 1 month (around 09/10/2022).   Total time spent: 25 minutes  Margaretann Loveless, MD  08/10/2022   This document may have been prepared by [facility] Voice Recognition  software and as such may include unintentional dictation errors.

## 2022-08-11 LAB — HEMOGLOBIN A1C
Est. average glucose Bld gHb Est-mCnc: 128 mg/dL
Hgb A1c MFr Bld: 6.1 % — ABNORMAL HIGH (ref 4.8–5.6)

## 2022-08-11 LAB — LIPID PANEL W/O CHOL/HDL RATIO
Cholesterol, Total: 152 mg/dL (ref 100–199)
HDL: 43 mg/dL (ref >39)
LDL Chol Calc (NIH): 94 mg/dL (ref 0–99)
Triglycerides: 79 mg/dL (ref 0–149)
VLDL Cholesterol Cal: 15 mg/dL (ref 5–40)

## 2022-08-11 LAB — CMP14+EGFR
ALT: 37 IU/L — ABNORMAL HIGH (ref 0–32)
AST: 30 IU/L (ref 0–40)
Albumin: 4.4 g/dL (ref 3.8–4.9)
Alkaline Phosphatase: 60 IU/L (ref 44–121)
BUN/Creatinine Ratio: 9 (ref 9–23)
BUN: 8 mg/dL (ref 6–24)
Bilirubin Total: 0.5 mg/dL (ref 0.0–1.2)
CO2: 17 mmol/L — ABNORMAL LOW (ref 20–29)
Calcium: 10 mg/dL (ref 8.7–10.2)
Chloride: 105 mmol/L (ref 96–106)
Creatinine, Ser: 0.91 mg/dL (ref 0.57–1.00)
Globulin, Total: 3 g/dL (ref 1.5–4.5)
Glucose: 91 mg/dL (ref 70–99)
Potassium: 4.2 mmol/L (ref 3.5–5.2)
Sodium: 141 mmol/L (ref 134–144)
Total Protein: 7.4 g/dL (ref 6.0–8.5)
eGFR: 76 mL/min/{1.73_m2} (ref >59)

## 2022-08-11 LAB — CBC WITH DIFFERENTIAL
Basophils Absolute: 0 10*3/uL (ref 0.0–0.2)
Basos: 1 %
EOS (ABSOLUTE): 0 10*3/uL (ref 0.0–0.4)
Eos: 1 %
Hematocrit: 46.5 % (ref 34.0–46.6)
Hemoglobin: 14.6 g/dL (ref 11.1–15.9)
Immature Grans (Abs): 0 10*3/uL (ref 0.0–0.1)
Immature Granulocytes: 0 %
Lymphocytes Absolute: 1.6 10*3/uL (ref 0.7–3.1)
Lymphs: 49 %
MCH: 26.2 pg — ABNORMAL LOW (ref 26.6–33.0)
MCHC: 31.4 g/dL — ABNORMAL LOW (ref 31.5–35.7)
MCV: 83 fL (ref 79–97)
Monocytes Absolute: 0.2 10*3/uL (ref 0.1–0.9)
Monocytes: 7 %
Neutrophils Absolute: 1.4 10*3/uL (ref 1.4–7.0)
Neutrophils: 42 %
RBC: 5.58 x10E6/uL — ABNORMAL HIGH (ref 3.77–5.28)
RDW: 14.4 % (ref 11.7–15.4)
WBC: 3.3 10*3/uL — ABNORMAL LOW (ref 3.4–10.8)

## 2022-08-11 LAB — TSH+T4F+T3FREE
Free T4: 1.39 ng/dL (ref 0.82–1.77)
T3, Free: 2.9 pg/mL (ref 2.0–4.4)
TSH: 1.75 u[IU]/mL (ref 0.450–4.500)

## 2022-08-13 NOTE — Progress Notes (Signed)
Patient notified

## 2022-08-16 ENCOUNTER — Ambulatory Visit: Payer: 59 | Admitting: Internal Medicine

## 2022-08-17 ENCOUNTER — Telehealth: Payer: Self-pay | Admitting: Internal Medicine

## 2022-08-17 NOTE — Telephone Encounter (Signed)
Patient called in and states that she is having vomiting and constipation since increasing the Trulicity dose recently. Should she continue on the Trulicity or should we change to something else?

## 2022-08-19 ENCOUNTER — Other Ambulatory Visit: Payer: Self-pay | Admitting: Internal Medicine

## 2022-08-19 DIAGNOSIS — E1169 Type 2 diabetes mellitus with other specified complication: Secondary | ICD-10-CM

## 2022-08-19 MED ORDER — TRULICITY 1.5 MG/0.5ML ~~LOC~~ SOAJ
1.5000 mg | SUBCUTANEOUS | 2 refills | Status: DC
Start: 2022-08-19 — End: 2022-09-23

## 2022-08-26 ENCOUNTER — Ambulatory Visit: Payer: 59 | Admitting: Internal Medicine

## 2022-09-01 ENCOUNTER — Ambulatory Visit
Admission: RE | Admit: 2022-09-01 | Discharge: 2022-09-01 | Disposition: A | Discharge status: Home / Self Care | Payer: 59 | Source: Ambulatory Visit | Attending: Internal Medicine | Admitting: Internal Medicine

## 2022-09-01 DIAGNOSIS — Z1231 Encounter for screening mammogram for malignant neoplasm of breast: Secondary | ICD-10-CM | POA: Insufficient documentation

## 2022-09-14 DIAGNOSIS — M545 Low back pain, unspecified: Secondary | ICD-10-CM | POA: Diagnosis not present

## 2022-09-14 DIAGNOSIS — G8929 Other chronic pain: Secondary | ICD-10-CM | POA: Diagnosis not present

## 2022-09-14 DIAGNOSIS — R293 Abnormal posture: Secondary | ICD-10-CM | POA: Diagnosis not present

## 2022-09-14 DIAGNOSIS — M25512 Pain in left shoulder: Secondary | ICD-10-CM | POA: Diagnosis not present

## 2022-09-14 DIAGNOSIS — M542 Cervicalgia: Secondary | ICD-10-CM | POA: Diagnosis not present

## 2022-09-23 ENCOUNTER — Ambulatory Visit (INDEPENDENT_AMBULATORY_CARE_PROVIDER_SITE_OTHER): Payer: 59 | Admitting: Cardiology

## 2022-09-23 ENCOUNTER — Encounter: Payer: Self-pay | Admitting: Cardiology

## 2022-09-23 VITALS — BP 108/80 | HR 98 | Ht 62.0 in | Wt 276.8 lb

## 2022-09-23 DIAGNOSIS — Z6841 Body Mass Index (BMI) 40.0 and over, adult: Secondary | ICD-10-CM

## 2022-09-23 DIAGNOSIS — I10 Essential (primary) hypertension: Secondary | ICD-10-CM | POA: Diagnosis not present

## 2022-09-23 DIAGNOSIS — E785 Hyperlipidemia, unspecified: Secondary | ICD-10-CM | POA: Diagnosis not present

## 2022-09-23 DIAGNOSIS — G4730 Sleep apnea, unspecified: Secondary | ICD-10-CM

## 2022-09-23 DIAGNOSIS — E1169 Type 2 diabetes mellitus with other specified complication: Secondary | ICD-10-CM

## 2022-09-23 MED ORDER — RYBELSUS 3 MG PO TABS: 1.0000 | ORAL_TABLET | Freq: Every day | ORAL | Status: DC

## 2022-09-23 NOTE — Progress Notes (Signed)
Established Patient Office Visit  Subjective:  Patient ID: Leslie Meadows, female    DOB: [birth year]  Age: 52 y.o. MRN: [id number]  Chief Complaint  Patient presents with   Follow-up    Patient in office for 4 month follow up. Patient feeling well overall.  Using and benefiting from CPAP machine. Patient reports nausea and stomach pain on Trulicity. Will change to Rybelsus 3 mg daily. Patient questioning if she needs to continue all her medications. Discussed importance of continuing blood pressure medications and statin in setting of diabetes. Continue to work on diet and exercise.     No other concerns at this time.   Past Medical History:  Diagnosis Date   Chest pain 10/29/2020   Diabetes mellitus without complication (HCC)    HLD (hyperlipidemia)    Morbid obesity with BMI of 50.0-59.9, adult (HCC)    Neck pain 10/31/2020   Overweight 05/24/2022   Sleep apnea    Thyroid disease     Past Surgical History:  Procedure Laterality Date   ABDOMINAL HYSTERECTOMY     ANTERIOR CERVICAL DECOMP/DISCECTOMY FUSION N/A 10/27/2020   Procedure: C4-5 ANTERIOR CERVICAL DECOMPRESSION/DISCECTOMY FUSION 1 LEVEL;  Surgeon: Lucy Chris, MD;  Location: [facility];  Service: Neurosurgery;  Laterality: N/A;   COLONOSCOPY WITH PROPOFOL N/A 05/21/2022   Procedure: COLONOSCOPY WITH PROPOFOL;  Surgeon: Regis Bill, MD;  Location: [facility];  Service: Endoscopy;  Laterality: N/A;  DM   TUBAL LIGATION     ulna nerve decompression Left     Social History   Socioeconomic History   Marital status: Divorced    Spouse name: Not on file   Number of children: Not on file   Years of education: Not on file   Highest education level: Not on file  Occupational History   Not on file  Tobacco Use   Smoking status: Never   Smokeless tobacco: Never  Vaping Use   Vaping status: Never Used  Substance and Sexual Activity   Alcohol use: No    Alcohol/week: 0.0 standard drinks of alcohol    Drug use: No   Sexual activity: Not on file  Other Topics Concern   Not on file  Social History Narrative   Not on file   Social Determinants of Health   Financial Resource Strain: Not on file  Food Insecurity: Not on file  Transportation Needs: Not on file  Physical Activity: Not on file  Stress: Not on file  Social Connections: Not on file  Intimate Partner Violence: Not on file    Family History  Problem Relation Age of Onset   Breast cancer Paternal Grandmother     Allergies  Allergen Reactions   Trulicity [Dulaglutide] Nausea Only and Other (See Comments)    Abdominal pain    Review of Systems  Constitutional: Negative.   HENT: Negative.    Eyes: Negative.   Respiratory: Negative.  Negative for shortness of breath.   Cardiovascular: Negative.  Negative for chest pain.  Gastrointestinal: Negative.  Negative for abdominal pain, constipation and diarrhea.  Genitourinary: Negative.   Musculoskeletal:  Negative for joint pain and myalgias.  Skin: Negative.   Neurological: Negative.  Negative for dizziness and headaches.  Endo/Heme/Allergies: Negative.   All other systems reviewed and are negative.      Objective:   BP 108/80   Pulse 98   Ht 5\' 2"  (1.575 m)   Wt 276 lb 12.8 oz (125.6 kg)   SpO2 94%   BMI  50.63 kg/m   Vitals:   09/23/22 1012  BP: 108/80  Pulse: 98  Height: 5\' 2"  (1.575 m)  Weight: 276 lb 12.8 oz (125.6 kg)  SpO2: 94%  BMI (Calculated): 50.61    Physical Exam Vitals and nursing note reviewed.  Constitutional:      Appearance: Normal appearance. She is normal weight.  HENT:     Head: Normocephalic and atraumatic.     Nose: Nose normal.     Mouth/Throat:     Mouth: Mucous membranes are moist.  Eyes:     Extraocular Movements: Extraocular movements intact.     Conjunctiva/sclera: Conjunctivae normal.     Pupils: Pupils are equal, round, and reactive to light.  Cardiovascular:     Rate and Rhythm: Normal rate and regular  rhythm.     Pulses: Normal pulses.     Heart sounds: Normal heart sounds.  Pulmonary:     Effort: Pulmonary effort is normal.     Breath sounds: Normal breath sounds.  Abdominal:     General: Abdomen is flat. Bowel sounds are normal.     Palpations: Abdomen is soft.  Musculoskeletal:        General: Normal range of motion.     Cervical back: Normal range of motion.  Skin:    General: Skin is warm and dry.  Neurological:     General: No focal deficit present.     Mental Status: She is alert and oriented to person, place, and time.  Psychiatric:        Mood and Affect: Mood normal.        Behavior: Behavior normal.        Thought Content: Thought content normal.        Judgment: Judgment normal.      No results found for any visits on 09/23/22.  Recent Results (from the past 2160 hour(s))  POCT Urinalysis Dipstick (24401)     Status: Abnormal   Collection Time: 07/26/22 11:32 AM  Result Value Ref Range   Color, UA yellow    Clarity, UA clear    Glucose, UA Positive (A) Negative   Bilirubin, UA neg    Ketones, UA neg    Spec Grav, UA 1.025 1.010 - 1.025   Blood, UA neg    pH, UA 5.0 5.0 - 8.0   Protein, UA Negative Negative   Urobilinogen, UA 0.2 0.2 or 1.0 E.U./dL   Nitrite, UA neg    Leukocytes, UA Negative Negative   Appearance clear    Odor yes   POC CREATINE & ALBUMIN,URINE     Status: None   Collection Time: 07/26/22 11:33 AM  Result Value Ref Range   Microalbumin Ur, POC 30 mg/L   Creatinine, POC 200 mg/dL   Albumin/Creatinine Ratio, Urine, POC <30   POCT CBG (Fasting - Glucose)     Status: Normal   Collection Time: 08/10/22 10:04 AM  Result Value Ref Range   Glucose Fasting, POC 78 70 - 99 mg/dL  POC CREATINE & ALBUMIN,URINE     Status: None   Collection Time: 08/10/22 10:25 AM  Result Value Ref Range   Microalbumin Ur, POC 30 mg/L   Creatinine, POC 200 mg/dL   Albumin/Creatinine Ratio, Urine, POC <30   TSH+T4F+T3Free     Status: None   Collection  Time: 08/10/22 10:47 AM  Result Value Ref Range   TSH 1.750 0.450 - 4.500 uIU/mL   T3, Free 2.9 2.0 - 4.4 pg/mL  Free T4 1.39 0.82 - 1.77 ng/dL  Hemoglobin O1H     Status: Abnormal   Collection Time: 08/10/22 10:47 AM  Result Value Ref Range   Hgb A1c MFr Bld 6.1 (H) 4.8 - 5.6 %    Comment:          Prediabetes: 5.7 - 6.4          Diabetes: >6.4          Glycemic control for adults with diabetes: <7.0    Est. average glucose Bld gHb Est-mCnc 128 mg/dL  CBC With Differential     Status: Abnormal   Collection Time: 08/10/22 10:47 AM  Result Value Ref Range   WBC 3.3 (L) 3.4 - 10.8 x10E3/uL   RBC 5.58 (H) 3.77 - 5.28 x10E6/uL   Hemoglobin 14.6 11.1 - 15.9 g/dL   Hematocrit 08.6 57.8 - 46.6 %   MCV 83 79 - 97 fL   MCH 26.2 (L) 26.6 - 33.0 pg   MCHC 31.4 (L) 31.5 - 35.7 g/dL   RDW 46.9 62.9 - 52.8 %   Neutrophils 42 Not Estab. %   Lymphs 49 Not Estab. %   Monocytes 7 Not Estab. %   Eos 1 Not Estab. %   Basos 1 Not Estab. %   Neutrophils Absolute 1.4 1.4 - 7.0 x10E3/uL   Lymphocytes Absolute 1.6 0.7 - 3.1 x10E3/uL   Monocytes Absolute 0.2 0.1 - 0.9 x10E3/uL   EOS (ABSOLUTE) 0.0 0.0 - 0.4 x10E3/uL   Basophils Absolute 0.0 0.0 - 0.2 x10E3/uL   Immature Granulocytes 0 Not Estab. %   Immature Grans (Abs) 0.0 0.0 - 0.1 x10E3/uL    Comment: **Effective September 06, 2022, profile [id number] CBC/Differential**   (No Platelet) will be made non-orderable. [facility] Offers:   [id number] CBC With Differential/Platelet   Lipid Panel w/o Chol/HDL Ratio     Status: None   Collection Time: 08/10/22 10:47 AM  Result Value Ref Range   Cholesterol, Total 152 100 - 199 mg/dL   Triglycerides 79 0 - 149 mg/dL   HDL 43 >01 mg/dL   VLDL Cholesterol Cal 15 5 - 40 mg/dL   LDL Chol Calc (NIH) 94 0 - 99 mg/dL  UUV25+DGUY     Status: Abnormal   Collection Time: 08/10/22 10:47 AM  Result Value Ref Range   Glucose 91 70 - 99 mg/dL   BUN 8 6 - 24 mg/dL   Creatinine, Ser 4.03 0.57 - 1.00 mg/dL   eGFR 76 >47  QQ/VZD/6.38   BUN/Creatinine Ratio 9 9 - 23   Sodium 141 134 - 144 mmol/L   Potassium 4.2 3.5 - 5.2 mmol/L   Chloride 105 96 - 106 mmol/L   CO2 17 (L) 20 - 29 mmol/L   Calcium 10.0 8.7 - 10.2 mg/dL   Total Protein 7.4 6.0 - 8.5 g/dL   Albumin 4.4 3.8 - 4.9 g/dL   Globulin, Total 3.0 1.5 - 4.5 g/dL   Bilirubin Total 0.5 0.0 - 1.2 mg/dL   Alkaline Phosphatase 60 44 - 121 IU/L   AST 30 0 - 40 IU/L   ALT 37 (H) 0 - 32 IU/L      Assessment & Plan:  Change Trulicity to Rybelsus.  Continue all other medications. Strict diet and exercise.  Problem List Items Addressed This Visit       Cardiovascular and Mediastinum   Essential hypertension, benign - Primary     Respiratory   Sleep apnea     Endocrine  Diabetes mellitus (HCC)   Relevant Medications   Semaglutide (RYBELSUS) 3 MG TABS     Other   Obesity, unspecified   Relevant Medications   Semaglutide (RYBELSUS) 3 MG TABS   HLD (hyperlipidemia)    Return in about 4 weeks (around 10/21/2022).   Total time spent: 25 minutes  Google, NP  09/23/2022   This document may have been prepared by Dragon Voice Recognition software and as such may include unintentional dictation errors.

## 2022-09-28 DIAGNOSIS — E119 Type 2 diabetes mellitus without complications: Secondary | ICD-10-CM | POA: Diagnosis not present

## 2022-09-28 DIAGNOSIS — M722 Plantar fascial fibromatosis: Secondary | ICD-10-CM | POA: Diagnosis not present

## 2022-10-03 ENCOUNTER — Encounter: Payer: Self-pay | Admitting: Internal Medicine

## 2022-10-20 DIAGNOSIS — M722 Plantar fascial fibromatosis: Secondary | ICD-10-CM | POA: Diagnosis not present

## 2022-10-20 DIAGNOSIS — M659 Synovitis and tenosynovitis, unspecified: Secondary | ICD-10-CM | POA: Diagnosis not present

## 2022-10-20 DIAGNOSIS — E119 Type 2 diabetes mellitus without complications: Secondary | ICD-10-CM | POA: Diagnosis not present

## 2022-10-21 ENCOUNTER — Other Ambulatory Visit: Payer: Self-pay | Admitting: Cardiology

## 2022-10-21 ENCOUNTER — Encounter: Payer: Self-pay | Admitting: Cardiology

## 2022-10-21 ENCOUNTER — Ambulatory Visit (INDEPENDENT_AMBULATORY_CARE_PROVIDER_SITE_OTHER): Payer: 59 | Admitting: Cardiology

## 2022-10-21 VITALS — BP 110/80 | HR 86 | Ht 62.0 in | Wt 275.6 lb

## 2022-10-21 DIAGNOSIS — E1169 Type 2 diabetes mellitus with other specified complication: Secondary | ICD-10-CM | POA: Diagnosis not present

## 2022-10-21 DIAGNOSIS — E039 Hypothyroidism, unspecified: Secondary | ICD-10-CM

## 2022-10-21 DIAGNOSIS — E785 Hyperlipidemia, unspecified: Secondary | ICD-10-CM | POA: Diagnosis not present

## 2022-10-21 DIAGNOSIS — I10 Essential (primary) hypertension: Secondary | ICD-10-CM

## 2022-10-21 MED ORDER — RYBELSUS 7 MG PO TABS: 1.0000 | ORAL_TABLET | Freq: Every day | ORAL | 4 refills | Status: DC

## 2022-10-21 NOTE — Progress Notes (Signed)
Established Patient Office Visit  Subjective:  Patient ID: Leslie Meadows, female    DOB: 1970-06-01  Age: 52 y.o. MRN: [id number]  Chief Complaint  Patient presents with   Follow-up    4 week f/u    Patient in office for 4 week follow up. Patient is taking and tolerating Rybelsus. Will increase to 7 mg daily. Return in one month with fasting labs prior.     No other concerns at this time.   Past Medical History:  Diagnosis Date   Chest pain 10/29/2020   Diabetes mellitus without complication (HCC)    HLD (hyperlipidemia)    Morbid obesity with BMI of 50.0-59.9, adult (HCC)    Neck pain 10/31/2020   Overweight 05/24/2022   Sleep apnea    Thyroid disease     Past Surgical History:  Procedure Laterality Date   ABDOMINAL HYSTERECTOMY     ANTERIOR CERVICAL DECOMP/DISCECTOMY FUSION N/A 10/27/2020   Procedure: C4-5 ANTERIOR CERVICAL DECOMPRESSION/DISCECTOMY FUSION 1 LEVEL;  Surgeon: Lucy Chris, MD;  Location: [facility];  Service: Neurosurgery;  Laterality: N/A;   COLONOSCOPY WITH PROPOFOL N/A 05/21/2022   Procedure: COLONOSCOPY WITH PROPOFOL;  Surgeon: Regis Bill, MD;  Location: [facility];  Service: Endoscopy;  Laterality: N/A;  DM   TUBAL LIGATION     ulna nerve decompression Left     Social History   Socioeconomic History   Marital status: Divorced    Spouse name: Not on file   Number of children: Not on file   Years of education: Not on file   Highest education level: Not on file  Occupational History   Not on file  Tobacco Use   Smoking status: Never   Smokeless tobacco: Never  Vaping Use   Vaping status: Never Used  Substance and Sexual Activity   Alcohol use: No    Alcohol/week: 0.0 standard drinks of alcohol   Drug use: No   Sexual activity: Not on file  Other Topics Concern   Not on file  Social History Narrative   Not on file   Social Determinants of Health   Financial Resource Strain: Not on file  Food Insecurity: Not on file   Transportation Needs: Not on file  Physical Activity: Not on file  Stress: Not on file  Social Connections: Not on file  Intimate Partner Violence: Not on file    Family History  Problem Relation Age of Onset   Breast cancer Paternal Grandmother     Allergies  Allergen Reactions   Trulicity [Dulaglutide] Nausea Only and Other (See Comments)    Abdominal pain    Review of Systems  Constitutional: Negative.   HENT: Negative.    Eyes: Negative.   Respiratory: Negative.  Negative for shortness of breath.   Cardiovascular: Negative.  Negative for chest pain.  Gastrointestinal: Negative.  Negative for abdominal pain, constipation and diarrhea.  Genitourinary: Negative.   Musculoskeletal:  Negative for joint pain and myalgias.  Skin: Negative.   Neurological: Negative.  Negative for dizziness and headaches.  Endo/Heme/Allergies: Negative.   All other systems reviewed and are negative.      Objective:   BP 110/80   Pulse 86   Ht 5\' 2"  (1.575 m)   Wt 275 lb 9.6 oz (125 kg)   SpO2 96%   BMI 50.41 kg/m   Vitals:   10/21/22 1306  BP: 110/80  Pulse: 86  Height: 5\' 2"  (1.575 m)  Weight: 275 lb 9.6 oz (125 kg)  SpO2: 96%  BMI (Calculated): 50.39    Physical Exam Vitals and nursing note reviewed.  Constitutional:      Appearance: Normal appearance. She is normal weight.  HENT:     Head: Normocephalic and atraumatic.     Nose: Nose normal.     Mouth/Throat:     Mouth: Mucous membranes are moist.  Eyes:     Extraocular Movements: Extraocular movements intact.     Conjunctiva/sclera: Conjunctivae normal.     Pupils: Pupils are equal, round, and reactive to light.  Cardiovascular:     Rate and Rhythm: Normal rate and regular rhythm.     Pulses: Normal pulses.     Heart sounds: Normal heart sounds.  Pulmonary:     Effort: Pulmonary effort is normal.     Breath sounds: Normal breath sounds.  Abdominal:     General: Abdomen is flat. Bowel sounds are normal.      Palpations: Abdomen is soft.  Musculoskeletal:        General: Normal range of motion.     Cervical back: Normal range of motion.  Skin:    General: Skin is warm and dry.  Neurological:     General: No focal deficit present.     Mental Status: She is alert and oriented to person, place, and time.  Psychiatric:        Mood and Affect: Mood normal.        Behavior: Behavior normal.        Thought Content: Thought content normal.        Judgment: Judgment normal.      No results found for any visits on 10/21/22.  Recent Results (from the past 2160 hour(s))  POCT Urinalysis Dipstick ([id number])     Status: Abnormal   Collection Time: 07/26/22 11:32 AM  Result Value Ref Range   Color, UA yellow    Clarity, UA clear    Glucose, UA Positive (A) Negative   Bilirubin, UA neg    Ketones, UA neg    Spec Grav, UA 1.025 1.010 - 1.025   Blood, UA neg    pH, UA 5.0 5.0 - 8.0   Protein, UA Negative Negative   Urobilinogen, UA 0.2 0.2 or 1.0 E.U./dL   Nitrite, UA neg    Leukocytes, UA Negative Negative   Appearance clear    Odor yes   POC CREATINE & ALBUMIN,URINE     Status: None   Collection Time: 07/26/22 11:33 AM  Result Value Ref Range   Microalbumin Ur, POC 30 mg/L   Creatinine, POC 200 mg/dL   Albumin/Creatinine Ratio, Urine, POC <30   POCT CBG (Fasting - Glucose)     Status: Normal   Collection Time: 08/10/22 10:04 AM  Result Value Ref Range   Glucose Fasting, POC 78 70 - 99 mg/dL  POC CREATINE & ALBUMIN,URINE     Status: None   Collection Time: 08/10/22 10:25 AM  Result Value Ref Range   Microalbumin Ur, POC 30 mg/L   Creatinine, POC 200 mg/dL   Albumin/Creatinine Ratio, Urine, POC <30   TSH+T4F+T3Free     Status: None   Collection Time: 08/10/22 10:47 AM  Result Value Ref Range   TSH 1.750 0.450 - 4.500 uIU/mL   T3, Free 2.9 2.0 - 4.4 pg/mL   Free T4 1.39 0.82 - 1.77 ng/dL  Hemoglobin M8U     Status: Abnormal   Collection Time: 08/10/22 10:47 AM  Result Value Ref  Range  Hgb A1c MFr Bld 6.1 (H) 4.8 - 5.6 %    Comment:          Prediabetes: 5.7 - 6.4          Diabetes: >6.4          Glycemic control for adults with diabetes: <7.0    Est. average glucose Bld gHb Est-mCnc 128 mg/dL  CBC With Differential     Status: Abnormal   Collection Time: 08/10/22 10:47 AM  Result Value Ref Range   WBC 3.3 (L) 3.4 - 10.8 x10E3/uL   RBC 5.58 (H) 3.77 - 5.28 x10E6/uL   Hemoglobin 14.6 11.1 - 15.9 g/dL   Hematocrit 16.1 09.6 - 46.6 %   MCV 83 79 - 97 fL   MCH 26.2 (L) 26.6 - 33.0 pg   MCHC 31.4 (L) 31.5 - 35.7 g/dL   RDW 04.5 40.9 - 81.1 %   Neutrophils 42 Not Estab. %   Lymphs 49 Not Estab. %   Monocytes 7 Not Estab. %   Eos 1 Not Estab. %   Basos 1 Not Estab. %   Neutrophils Absolute 1.4 1.4 - 7.0 x10E3/uL   Lymphocytes Absolute 1.6 0.7 - 3.1 x10E3/uL   Monocytes Absolute 0.2 0.1 - 0.9 x10E3/uL   EOS (ABSOLUTE) 0.0 0.0 - 0.4 x10E3/uL   Basophils Absolute 0.0 0.0 - 0.2 x10E3/uL   Immature Granulocytes 0 Not Estab. %   Immature Grans (Abs) 0.0 0.0 - 0.1 x10E3/uL    Comment: **Effective September 06, 2022, profile [id number] CBC/Differential**   (No Platelet) will be made non-orderable. [facility] Offers:   [id number] CBC With Differential/Platelet   Lipid Panel w/o Chol/HDL Ratio     Status: None   Collection Time: 08/10/22 10:47 AM  Result Value Ref Range   Cholesterol, Total 152 100 - 199 mg/dL   Triglycerides 79 0 - 149 mg/dL   HDL 43 >95 mg/dL   VLDL Cholesterol Cal 15 5 - 40 mg/dL   LDL Chol Calc (NIH) 94 0 - 99 mg/dL  [id number]     Status: Abnormal   Collection Time: 08/10/22 10:47 AM  Result Value Ref Range   Glucose 91 70 - 99 mg/dL   BUN 8 6 - 24 mg/dL   Creatinine, Ser 8.46 0.57 - 1.00 mg/dL   eGFR 76 >96 EX/BMW/4.13   BUN/Creatinine Ratio 9 9 - 23   Sodium 141 134 - 144 mmol/L   Potassium 4.2 3.5 - 5.2 mmol/L   Chloride 105 96 - 106 mmol/L   CO2 17 (L) 20 - 29 mmol/L   Calcium 10.0 8.7 - 10.2 mg/dL   Total Protein 7.4 6.0 - 8.5 g/dL    Albumin 4.4 3.8 - 4.9 g/dL   Globulin, Total 3.0 1.5 - 4.5 g/dL   Bilirubin Total 0.5 0.0 - 1.2 mg/dL   Alkaline Phosphatase 60 44 - 121 IU/L   AST 30 0 - 40 IU/L   ALT 37 (H) 0 - 32 IU/L      Assessment & Plan:  Increase Ryebslus to 7 mg. Fasting labs prior to next visit.   Problem List Items Addressed This Visit       Cardiovascular and Mediastinum   Essential hypertension, benign - Primary     Endocrine   Diabetes mellitus (HCC)     Other   HLD (hyperlipidemia)    No follow-ups on file.   Total time spent: 25 minutes  [employer], NP  10/21/2022   This document may have  been prepared by [employer] and as such may include unintentional dictation errors.

## 2022-10-26 ENCOUNTER — Encounter: Payer: Self-pay | Admitting: Cardiology

## 2022-11-12 ENCOUNTER — Other Ambulatory Visit: Payer: 59

## 2022-11-12 DIAGNOSIS — E785 Hyperlipidemia, unspecified: Secondary | ICD-10-CM

## 2022-11-12 DIAGNOSIS — E039 Hypothyroidism, unspecified: Secondary | ICD-10-CM | POA: Diagnosis not present

## 2022-11-12 DIAGNOSIS — E1169 Type 2 diabetes mellitus with other specified complication: Secondary | ICD-10-CM

## 2022-11-12 DIAGNOSIS — I10 Essential (primary) hypertension: Secondary | ICD-10-CM | POA: Diagnosis not present

## 2022-11-13 LAB — CMP14+EGFR
ALT: 37 [IU]/L — ABNORMAL HIGH (ref 0–32)
AST: 33 [IU]/L (ref 0–40)
Albumin: 4.1 g/dL (ref 3.8–4.9)
Alkaline Phosphatase: 63 [IU]/L (ref 44–121)
BUN/Creatinine Ratio: 14 (ref 9–23)
BUN: 12 mg/dL (ref 6–24)
Bilirubin Total: 0.6 mg/dL (ref 0.0–1.2)
CO2: 19 mmol/L — ABNORMAL LOW (ref 20–29)
Calcium: 9.5 mg/dL (ref 8.7–10.2)
Chloride: 102 mmol/L (ref 96–106)
Creatinine, Ser: 0.83 mg/dL (ref 0.57–1.00)
Globulin, Total: 2.9 g/dL (ref 1.5–4.5)
Glucose: 72 mg/dL (ref 70–99)
Potassium: 4.5 mmol/L (ref 3.5–5.2)
Sodium: 139 mmol/L (ref 134–144)
Total Protein: 7 g/dL (ref 6.0–8.5)
eGFR: 85 mL/min/{1.73_m2} (ref >59)

## 2022-11-13 LAB — LIPID PANEL
Chol/HDL Ratio: 3.6 {ratio} (ref 0.0–4.4)
Cholesterol, Total: 149 mg/dL (ref 100–199)
HDL: 41 mg/dL (ref >39)
LDL Chol Calc (NIH): 94 mg/dL (ref 0–99)
Triglycerides: 72 mg/dL (ref 0–149)
VLDL Cholesterol Cal: 14 mg/dL (ref 5–40)

## 2022-11-13 LAB — HEMOGLOBIN A1C
Est. average glucose Bld gHb Est-mCnc: 137 mg/dL
Hgb A1c MFr Bld: 6.4 % — ABNORMAL HIGH (ref 4.8–5.6)

## 2022-11-13 LAB — TSH: TSH: 1.3 u[IU]/mL (ref 0.450–4.500)

## 2022-11-18 ENCOUNTER — Other Ambulatory Visit: Payer: Self-pay | Admitting: Cardiology

## 2022-11-18 ENCOUNTER — Encounter: Payer: Self-pay | Admitting: Cardiology

## 2022-11-18 ENCOUNTER — Ambulatory Visit (INDEPENDENT_AMBULATORY_CARE_PROVIDER_SITE_OTHER): Payer: 59 | Admitting: Cardiology

## 2022-11-18 VITALS — BP 130/79 | HR 101 | Ht 62.0 in | Wt 279.2 lb

## 2022-11-18 DIAGNOSIS — E1169 Type 2 diabetes mellitus with other specified complication: Secondary | ICD-10-CM

## 2022-11-18 DIAGNOSIS — E785 Hyperlipidemia, unspecified: Secondary | ICD-10-CM

## 2022-11-18 DIAGNOSIS — E66813 Obesity, class 3: Secondary | ICD-10-CM

## 2022-11-18 DIAGNOSIS — I10 Essential (primary) hypertension: Secondary | ICD-10-CM

## 2022-11-18 DIAGNOSIS — Z6841 Body Mass Index (BMI) 40.0 and over, adult: Secondary | ICD-10-CM

## 2022-11-18 LAB — POCT CBG (FASTING - GLUCOSE)-MANUAL ENTRY: Glucose Fasting, POC: 173 mg/dL — AB (ref 70–99)

## 2022-11-18 MED ORDER — RYBELSUS 7 MG PO TABS: 1.0000 | ORAL_TABLET | Freq: Every day | ORAL | 4 refills | Status: DC

## 2022-11-18 NOTE — Progress Notes (Signed)
Established Patient Office Visit  Subjective:  Patient ID: Leslie Meadows, female    DOB: 1970/11/29  Age: 52 y.o. MRN: [id number]  Chief Complaint  Patient presents with   Follow-up    4 week f/u    Patient in office for 4 week follow up since increasing Rybelsus to 7 mg daily. Patient states she has not been taking 7 mg due to the pharmacy not having the prescription. Will resent today. Patient feeling well, no new complaints today.  Discussed recent lab work. LDL controlled. Hgb A1c elevated. Discussed diabetic diet.     No other concerns at this time.   Past Medical History:  Diagnosis Date   Chest pain 10/29/2020   Diabetes mellitus without complication (HCC)    HLD (hyperlipidemia)    Morbid obesity with BMI of 50.0-59.9, adult (HCC)    Neck pain 10/31/2020   Overweight 05/24/2022   Sleep apnea    Thyroid disease     Past Surgical History:  Procedure Laterality Date   ABDOMINAL HYSTERECTOMY     ANTERIOR CERVICAL DECOMP/DISCECTOMY FUSION N/A 10/27/2020   Procedure: C4-5 ANTERIOR CERVICAL DECOMPRESSION/DISCECTOMY FUSION 1 LEVEL;  Surgeon: Lucy Chris, MD;  Location: [facility];  Service: Neurosurgery;  Laterality: N/A;   COLONOSCOPY WITH PROPOFOL N/A 05/21/2022   Procedure: COLONOSCOPY WITH PROPOFOL;  Surgeon: Regis Bill, MD;  Location: [facility];  Service: Endoscopy;  Laterality: N/A;  DM   TUBAL LIGATION     ulna nerve decompression Left     Social History   Socioeconomic History   Marital status: Divorced    Spouse name: Not on file   Number of children: Not on file   Years of education: Not on file   Highest education level: Not on file  Occupational History   Not on file  Tobacco Use   Smoking status: Never   Smokeless tobacco: Never  Vaping Use   Vaping status: Never Used  Substance and Sexual Activity   Alcohol use: No    Alcohol/week: 0.0 standard drinks of alcohol   Drug use: No   Sexual activity: Not on file  Other Topics Concern    Not on file  Social History Narrative   Not on file   Social Determinants of Health   Financial Resource Strain: Not on file  Food Insecurity: Not on file  Transportation Needs: Not on file  Physical Activity: Not on file  Stress: Not on file  Social Connections: Not on file  Intimate Partner Violence: Not on file    Family History  Problem Relation Age of Onset   Breast cancer Paternal Grandmother     Allergies  Allergen Reactions   Trulicity [Dulaglutide] Nausea Only and Other (See Comments)    Abdominal pain    Review of Systems  Constitutional: Negative.   HENT: Negative.    Eyes: Negative.   Respiratory: Negative.  Negative for shortness of breath.   Cardiovascular: Negative.  Negative for chest pain.  Gastrointestinal: Negative.  Negative for abdominal pain, constipation and diarrhea.  Genitourinary: Negative.   Musculoskeletal:  Negative for joint pain and myalgias.  Skin: Negative.   Neurological: Negative.  Negative for dizziness and headaches.  Endo/Heme/Allergies: Negative.   All other systems reviewed and are negative.      Objective:   BP 130/79   Pulse (!) 101   Ht 5\' 2"  (1.575 m)   Wt 279 lb 3.2 oz (126.6 kg)   SpO2 97%   BMI 51.07 kg/m  Vitals:   11/18/22 1039  BP: 130/79  Pulse: (!) 101  Height: 5\' 2"  (1.575 m)  Weight: 279 lb 3.2 oz (126.6 kg)  SpO2: 97%  BMI (Calculated): 51.05    Physical Exam Vitals and nursing note reviewed.  Constitutional:      Appearance: Normal appearance. She is normal weight.  HENT:     Head: Normocephalic and atraumatic.     Nose: Nose normal.     Mouth/Throat:     Mouth: Mucous membranes are moist.  Eyes:     Extraocular Movements: Extraocular movements intact.     Conjunctiva/sclera: Conjunctivae normal.     Pupils: Pupils are equal, round, and reactive to light.  Cardiovascular:     Rate and Rhythm: Normal rate and regular rhythm.     Pulses: Normal pulses.     Heart sounds: Normal  heart sounds.  Pulmonary:     Effort: Pulmonary effort is normal.     Breath sounds: Normal breath sounds.  Abdominal:     General: Abdomen is flat. Bowel sounds are normal.     Palpations: Abdomen is soft.  Musculoskeletal:        General: Normal range of motion.     Cervical back: Normal range of motion.  Skin:    General: Skin is warm and dry.  Neurological:     General: No focal deficit present.     Mental Status: She is alert and oriented to person, place, and time.  Psychiatric:        Mood and Affect: Mood normal.        Behavior: Behavior normal.        Thought Content: Thought content normal.        Judgment: Judgment normal.      Results for orders placed or performed in visit on 11/18/22  POCT CBG (Fasting - Glucose)  Result Value Ref Range   Glucose Fasting, POC 173 (A) 70 - 99 mg/dL    Recent Results (from the past 2160 hour(s))  TSH     Status: None   Collection Time: 11/12/22  3:23 PM  Result Value Ref Range   TSH 1.300 0.450 - 4.500 uIU/mL  Lipid Profile     Status: None   Collection Time: 11/12/22  3:23 PM  Result Value Ref Range   Cholesterol, Total 149 100 - 199 mg/dL   Triglycerides 72 0 - 149 mg/dL   HDL 41 >81 mg/dL   VLDL Cholesterol Cal 14 5 - 40 mg/dL   LDL Chol Calc (NIH) 94 0 - 99 mg/dL   Chol/HDL Ratio 3.6 0.0 - 4.4 ratio    Comment:                                   T. Chol/HDL Ratio                                             Men  Women                               1/2 Avg.Risk  3.4    3.3  Avg.Risk  5.0    4.4                                2X Avg.Risk  9.6    7.1                                3X Avg.Risk 23.4   11.0   CMP14+EGFR     Status: Abnormal   Collection Time: 11/12/22  3:23 PM  Result Value Ref Range   Glucose 72 70 - 99 mg/dL   BUN 12 6 - 24 mg/dL   Creatinine, Ser 6.96 0.57 - 1.00 mg/dL   eGFR 85 >29 BM/WUX/3.24   BUN/Creatinine Ratio 14 9 - 23   Sodium 139 134 - 144 mmol/L    Potassium 4.5 3.5 - 5.2 mmol/L   Chloride 102 96 - 106 mmol/L   CO2 19 (L) 20 - 29 mmol/L   Calcium 9.5 8.7 - 10.2 mg/dL   Total Protein 7.0 6.0 - 8.5 g/dL   Albumin 4.1 3.8 - 4.9 g/dL   Globulin, Total 2.9 1.5 - 4.5 g/dL   Bilirubin Total 0.6 0.0 - 1.2 mg/dL   Alkaline Phosphatase 63 44 - 121 IU/L   AST 33 0 - 40 IU/L   ALT 37 (H) 0 - 32 IU/L  Hemoglobin A1c     Status: Abnormal   Collection Time: 11/12/22  3:23 PM  Result Value Ref Range   Hgb A1c MFr Bld 6.4 (H) 4.8 - 5.6 %    Comment:          Prediabetes: 5.7 - 6.4          Diabetes: >6.4          Glycemic control for adults with diabetes: <7.0    Est. average glucose Bld gHb Est-mCnc 137 mg/dL  POCT CBG (Fasting - Glucose)     Status: Abnormal   Collection Time: 11/18/22 10:44 AM  Result Value Ref Range   Glucose Fasting, POC 173 (A) 70 - 99 mg/dL      Assessment & Plan:  Increase Rybelsus ot 7 mg daily.  Continue to work on a [employer].    Problem List Items Addressed This Visit       Cardiovascular and Mediastinum   Essential hypertension, benign     Endocrine   Diabetes mellitus (HCC) - Primary   Relevant Medications   Semaglutide (RYBELSUS) 7 MG TABS   Other Relevant Orders   POCT CBG (Fasting - Glucose) (Completed)     Other   Obesity, unspecified   Relevant Medications   Semaglutide (RYBELSUS) 7 MG TABS   HLD (hyperlipidemia)    Return in about 2 months (around 01/18/2023).   Total time spent: 25 minutes  Google, NP  11/18/2022   This document may have been prepared by Dragon Voice Recognition software and as such may include unintentional dictation errors.

## 2022-11-22 ENCOUNTER — Other Ambulatory Visit: Payer: Self-pay

## 2022-11-22 MED ORDER — ROSUVASTATIN CALCIUM 40 MG PO TABS: 40.0000 mg | ORAL_TABLET | Freq: Every day | ORAL | 3 refills | Status: DC

## 2022-11-25 ENCOUNTER — Other Ambulatory Visit: Payer: Self-pay | Admitting: Cardiology

## 2022-11-25 MED ORDER — TRULICITY 0.75 MG/0.5ML ~~LOC~~ SOAJ: 0.7500 mg | SUBCUTANEOUS | 3 refills | Status: DC

## 2022-12-31 DIAGNOSIS — R293 Abnormal posture: Secondary | ICD-10-CM | POA: Diagnosis not present

## 2022-12-31 DIAGNOSIS — M25512 Pain in left shoulder: Secondary | ICD-10-CM | POA: Diagnosis not present

## 2022-12-31 DIAGNOSIS — G8929 Other chronic pain: Secondary | ICD-10-CM | POA: Diagnosis not present

## 2022-12-31 DIAGNOSIS — M542 Cervicalgia: Secondary | ICD-10-CM | POA: Diagnosis not present

## 2023-01-11 DIAGNOSIS — M542 Cervicalgia: Secondary | ICD-10-CM | POA: Diagnosis not present

## 2023-01-11 DIAGNOSIS — M25512 Pain in left shoulder: Secondary | ICD-10-CM | POA: Diagnosis not present

## 2023-01-11 DIAGNOSIS — G5622 Lesion of ulnar nerve, left upper limb: Secondary | ICD-10-CM | POA: Diagnosis not present

## 2023-01-11 DIAGNOSIS — G894 Chronic pain syndrome: Secondary | ICD-10-CM | POA: Diagnosis not present

## 2023-01-11 DIAGNOSIS — M7918 Myalgia, other site: Secondary | ICD-10-CM | POA: Diagnosis not present

## 2023-01-17 ENCOUNTER — Encounter: Payer: Self-pay | Admitting: Cardiology

## 2023-01-17 ENCOUNTER — Ambulatory Visit (INDEPENDENT_AMBULATORY_CARE_PROVIDER_SITE_OTHER): Payer: 59 | Admitting: Cardiology

## 2023-01-17 VITALS — BP 116/74 | HR 85 | Ht 62.0 in | Wt 278.6 lb

## 2023-01-17 DIAGNOSIS — I10 Essential (primary) hypertension: Secondary | ICD-10-CM

## 2023-01-17 DIAGNOSIS — E039 Hypothyroidism, unspecified: Secondary | ICD-10-CM | POA: Diagnosis not present

## 2023-01-17 DIAGNOSIS — E1169 Type 2 diabetes mellitus with other specified complication: Secondary | ICD-10-CM

## 2023-01-17 DIAGNOSIS — E785 Hyperlipidemia, unspecified: Secondary | ICD-10-CM

## 2023-01-17 MED ORDER — FLUCONAZOLE 150 MG PO TABS: ORAL_TABLET | ORAL | 0 refills | Status: DC

## 2023-01-17 MED ORDER — GLIPIZIDE ER 10 MG PO TB24: 10.0000 mg | ORAL_TABLET | Freq: Every day | ORAL | 2 refills | Status: DC

## 2023-01-17 NOTE — Progress Notes (Signed)
Established Patient Office Visit  Subjective:  Patient ID: Leslie Meadows, female    DOB: Nov 29, 1970  Age: 52 y.o. MRN: [id number]  Chief Complaint  Patient presents with   Follow-up    2 month follow up    Patient in office for 2 month follow up. Patient complaining of "white stuff on my tongue that won't come off". Also thinks she may have a yeast infection. Will send in diflucan. Patient on Jardiance, wanting to change medications. Will change to glipizide. Patient has taken it in the past with no intolerance.  Patient overdue for a DEE, will send a referral.     No other concerns at this time.   Past Medical History:  Diagnosis Date   Chest pain 10/29/2020   Diabetes mellitus without complication (HCC)    HLD (hyperlipidemia)    Morbid obesity with BMI of 50.0-59.9, adult (HCC)    Neck pain 10/31/2020   Overweight 05/24/2022   Sleep apnea    Thyroid disease     Past Surgical History:  Procedure Laterality Date   ABDOMINAL HYSTERECTOMY     ANTERIOR CERVICAL DECOMP/DISCECTOMY FUSION N/A 10/27/2020   Procedure: C4-5 ANTERIOR CERVICAL DECOMPRESSION/DISCECTOMY FUSION 1 LEVEL;  Surgeon: Lucy Chris, MD;  Location: [facility];  Service: Neurosurgery;  Laterality: N/A;   COLONOSCOPY WITH PROPOFOL N/A 05/21/2022   Procedure: COLONOSCOPY WITH PROPOFOL;  Surgeon: Regis Bill, MD;  Location: [facility];  Service: Endoscopy;  Laterality: N/A;  DM   TUBAL LIGATION     ulna nerve decompression Left     Social History   Socioeconomic History   Marital status: Divorced    Spouse name: Not on file   Number of children: Not on file   Years of education: Not on file   Highest education level: Not on file  Occupational History   Not on file  Tobacco Use   Smoking status: Never   Smokeless tobacco: Never  Vaping Use   Vaping status: Never Used  Substance and Sexual Activity   Alcohol use: No    Alcohol/week: 0.0 standard drinks of alcohol   Drug use: No   Sexual  activity: Not on file  Other Topics Concern   Not on file  Social History Narrative   Not on file   Social Determinants of Health   Financial Resource Strain: Not on file  Food Insecurity: Not on file  Transportation Needs: Not on file  Physical Activity: Not on file  Stress: Not on file  Social Connections: Not on file  Intimate Partner Violence: Not on file    Family History  Problem Relation Age of Onset   Breast cancer Paternal Grandmother     Allergies  Allergen Reactions   Trulicity [Dulaglutide] Nausea Only and Other (See Comments)    Abdominal pain    Outpatient Medications Prior to Visit  Medication Sig   Accu-Chek Softclix Lancets lancets Use as instructed   cyclobenzaprine (FLEXERIL) 10 MG tablet Take 1 tablet (10 mg total) by mouth 3 (three) times daily as needed for muscle spasms.   Dulaglutide (TRULICITY) 0.75 MG/0.5ML SOPN Inject 0.75 mg into the skin once a week.   DULoxetine (CYMBALTA) 20 MG capsule Take 40 mg by mouth daily.   glucose blood (ACCU-CHEK GUIDE) test strip Use as instructed.  Check blood sugar in AM fasting and 2 hours after dinner   levothyroxine (SYNTHROID) 50 MCG tablet TAKE 1 TABLET BY MOUTH DAILY IN AM ON EMPTY STOMACH, NOTE DOSAGE INCREASE  NON FORMULARY Pt uses a cpap nightly   rosuvastatin (CRESTOR) 40 MG tablet Take 1 tablet (40 mg total) by mouth at bedtime.   [DISCONTINUED] empagliflozin (JARDIANCE) 25 MG TABS tablet Take 1 tablet (25 mg total) by mouth daily before breakfast.   No facility-administered medications prior to visit.    Review of Systems  Constitutional: Negative.   HENT: Negative.    Eyes: Negative.   Respiratory: Negative.  Negative for shortness of breath.   Cardiovascular: Negative.  Negative for chest pain.  Gastrointestinal: Negative.  Negative for abdominal pain, constipation and diarrhea.  Genitourinary: Negative.   Musculoskeletal:  Negative for joint pain and myalgias.  Skin: Negative.    Neurological: Negative.  Negative for dizziness and headaches.  Endo/Heme/Allergies: Negative.   All other systems reviewed and are negative.      Objective:   BP 116/74   Pulse 85   Ht 5\' 2"  (1.575 m)   Wt 278 lb 9.6 oz (126.4 kg)   SpO2 97%   BMI 50.96 kg/m   Vitals:   01/17/23 0955  BP: 116/74  Pulse: 85  Height: 5\' 2"  (1.575 m)  Weight: 278 lb 9.6 oz (126.4 kg)  SpO2: 97%  BMI (Calculated): 50.94    Physical Exam Vitals and nursing note reviewed.  Constitutional:      Appearance: Normal appearance. She is normal weight.  HENT:     Head: Normocephalic and atraumatic.     Nose: Nose normal.     Mouth/Throat:     Mouth: Mucous membranes are moist.  Eyes:     Extraocular Movements: Extraocular movements intact.     Conjunctiva/sclera: Conjunctivae normal.     Pupils: Pupils are equal, round, and reactive to light.  Cardiovascular:     Rate and Rhythm: Normal rate and regular rhythm.     Pulses: Normal pulses.     Heart sounds: Normal heart sounds.  Pulmonary:     Effort: Pulmonary effort is normal.     Breath sounds: Normal breath sounds.  Abdominal:     General: Abdomen is flat. Bowel sounds are normal.     Palpations: Abdomen is soft.  Musculoskeletal:        General: Normal range of motion.     Cervical back: Normal range of motion.  Skin:    General: Skin is warm and dry.  Neurological:     General: No focal deficit present.     Mental Status: She is alert and oriented to person, place, and time.  Psychiatric:        Mood and Affect: Mood normal.        Behavior: Behavior normal.        Thought Content: Thought content normal.        Judgment: Judgment normal.      No results found for any visits on 01/17/23.  Recent Results (from the past 2160 hour(s))  TSH     Status: None   Collection Time: 11/12/22  3:23 PM  Result Value Ref Range   TSH 1.300 0.450 - 4.500 uIU/mL  Lipid Profile     Status: None   Collection Time: 11/12/22  3:23 PM   Result Value Ref Range   Cholesterol, Total 149 100 - 199 mg/dL   Triglycerides 72 0 - 149 mg/dL   HDL 41 >06 mg/dL   VLDL Cholesterol Cal 14 5 - 40 mg/dL   LDL Chol Calc (NIH) 94 0 - 99 mg/dL   Chol/HDL Ratio 3.6  0.0 - 4.4 ratio    Comment:                                   T. Chol/HDL Ratio                                             Men  Women                               1/2 Avg.Risk  3.4    3.3                                   Avg.Risk  5.0    4.4                                2X Avg.Risk  9.6    7.1                                3X Avg.Risk 23.4   11.0   CMP14+EGFR     Status: Abnormal   Collection Time: 11/12/22  3:23 PM  Result Value Ref Range   Glucose 72 70 - 99 mg/dL   BUN 12 6 - 24 mg/dL   Creatinine, Ser 6.57 0.57 - 1.00 mg/dL   eGFR 85 >84 ON/GEX/5.28   BUN/Creatinine Ratio 14 9 - 23   Sodium 139 134 - 144 mmol/L   Potassium 4.5 3.5 - 5.2 mmol/L   Chloride 102 96 - 106 mmol/L   CO2 19 (L) 20 - 29 mmol/L   Calcium 9.5 8.7 - 10.2 mg/dL   Total Protein 7.0 6.0 - 8.5 g/dL   Albumin 4.1 3.8 - 4.9 g/dL   Globulin, Total 2.9 1.5 - 4.5 g/dL   Bilirubin Total 0.6 0.0 - 1.2 mg/dL   Alkaline Phosphatase 63 44 - 121 IU/L   AST 33 0 - 40 IU/L   ALT 37 (H) 0 - 32 IU/L  Hemoglobin A1c     Status: Abnormal   Collection Time: 11/12/22  3:23 PM  Result Value Ref Range   Hgb A1c MFr Bld 6.4 (H) 4.8 - 5.6 %    Comment:          Prediabetes: 5.7 - 6.4          Diabetes: >6.4          Glycemic control for adults with diabetes: <7.0    Est. average glucose Bld gHb Est-mCnc 137 mg/dL  POCT CBG (Fasting - Glucose)     Status: Abnormal   Collection Time: 11/18/22 10:44 AM  Result Value Ref Range   Glucose Fasting, POC 173 (A) 70 - 99 mg/dL      Assessment & Plan:  Change Jardiance to glipizide.  Schedule DEE.   Problem List Items Addressed This Visit       Cardiovascular and Mediastinum   Essential hypertension, benign - Primary     Endocrine   Primary  hypothyroidism   Diabetes mellitus (HCC)   Relevant Medications   glipiZIDE (GLUCOTROL XL) 10 MG  24 hr tablet   Other Relevant Orders   Ambulatory referral to Ophthalmology     Other   HLD (hyperlipidemia)    Return in about 2 months (around 03/20/2023) for with fasting labs prior.   Total time spent: 25 minutes  [employer], NP  01/17/2023   This document may have been prepared by Dragon Voice Recognition software and as such may include unintentional dictation errors.

## 2023-01-18 DIAGNOSIS — E119 Type 2 diabetes mellitus without complications: Secondary | ICD-10-CM | POA: Diagnosis not present

## 2023-02-04 DIAGNOSIS — R55 Syncope and collapse: Secondary | ICD-10-CM | POA: Diagnosis not present

## 2023-02-04 DIAGNOSIS — R519 Headache, unspecified: Secondary | ICD-10-CM | POA: Diagnosis not present

## 2023-02-07 ENCOUNTER — Encounter: Payer: Self-pay | Admitting: Family Medicine

## 2023-02-07 ENCOUNTER — Ambulatory Visit: Payer: Medicaid Other | Admitting: Family Medicine

## 2023-02-07 DIAGNOSIS — Z113 Encounter for screening for infections with a predominantly sexual mode of transmission: Secondary | ICD-10-CM

## 2023-02-07 DIAGNOSIS — N760 Acute vaginitis: Secondary | ICD-10-CM

## 2023-02-07 LAB — WET PREP FOR TRICH, YEAST, CLUE
Trichomonas Exam: NEGATIVE
Yeast Exam: NEGATIVE

## 2023-02-07 LAB — HM HIV SCREENING LAB: HM HIV Screening: NEGATIVE

## 2023-02-07 MED ORDER — CLOTRIMAZOLE 1 % VA CREA: 1.0000 | TOPICAL_CREAM | Freq: Every day | VAGINAL | Status: AC

## 2023-02-07 NOTE — Progress Notes (Signed)
Pt is here for STD testing.  Wet mount results reviewed. The patient was dispensed Clotrimazole 1% Vaginal Cream #1 today. I provided counseling today regarding the medication. We discussed the medication, the side effects and when to call clinic. Patient given the opportunity to ask questions. Questions answered.  Condoms declined.  Berdie Ogren, RN

## 2023-02-07 NOTE — Progress Notes (Signed)
[facility] Department  STI clinic/screening visit [address] [place] [address] [phone]  Subjective:  Leslie Meadows is a 52 y.o. female being seen today for an STI screening visit. The patient reports they do have symptoms.  Patient reports that they do not desire a pregnancy in the next year.   They reported they are not interested in discussing contraception today.    No LMP recorded. Patient has had a hysterectomy.  Patient has the following medical conditions:   Patient Active Problem List   Diagnosis Date Noted   Essential hypertension, benign 07/26/2022   Breast cancer screening by mammogram 07/26/2022   Diabetes mellitus (HCC) 05/24/2022   Seizure (HCC) 10/29/2020   Sleep apnea 10/29/2020   HLD (hyperlipidemia)    Primary hypothyroidism    Cervical myelopathy (HCC) 10/27/2020   Obesity, unspecified 08/08/2017   Anxiety 01/07/2017    Chief Complaint  Patient presents with   SEXUALLY TRANSMITTED DISEASE    Vaginal discharge and odor x 1 week    HPI  Patient reports to clinic with a 1 week hx of vaginal discharge and odor. Hx significant for T2DM, states she started jardiance and trulicity- and after that developed a white patch on her tongue and was treated with diflucan.   Does the patient using douching products? No  Last HIV test per patient/review of record was  Lab Results  Component Value Date   HMHIVSCREEN Negative - Validated 02/24/2018    Lab Results  Component Value Date   HIV Non Reactive 10/30/2020     Last HEPC test per patient/review of record was No results found for: "HMHEPCSCREEN" No components found for: "HEPC"   Last HEPB test per patient/review of record was No components found for: "HMHEPBSCREEN" No components found for: "HEPC"   Patient reports last pap was:  2011- s/p total hysterectomy No results found for: "DIAGPAP", "HPVHIGH", "ADEQPAP" No results found for: "SPECADGYN" Result Date Procedure Results  Follow-ups  06/11/2009 HM PAP SMEAR HM Pap smear: Negative     Screening for MPX risk: Does the patient have an unexplained rash? No Is the patient MSM? No Does the patient endorse multiple sex partners or anonymous sex partners? No Did the patient have close or sexual contact with a person diagnosed with MPX? No Has the patient traveled outside the [place] where MPX is endemic? No Is there a high clinical suspicion for MPX-- evidenced by one of the following No  -Unlikely to be chickenpox  -Lymphadenopathy  -Rash that present in same phase of evolution on any given body part See flowsheet for further details and programmatic requirements.   Immunization history:  Immunization History  Administered Date(s) Administered   Tdap 09/14/2013     The following portions of the patient's history were reviewed and updated as appropriate: allergies, current medications, past medical history, past social history, past surgical history and problem list.  Objective:  There were no vitals filed for this visit.  Physical Exam Vitals and nursing note reviewed. Exam conducted with a chaperone present Leslie Rocher RN).  Constitutional:      Appearance: She is obese.  HENT:     Head: Normocephalic and atraumatic.     Mouth/Throat:     Mouth: Mucous membranes are moist.     Pharynx: Oropharynx is clear. No oropharyngeal exudate or posterior oropharyngeal erythema.     Comments: Mild amt of white on tongue Pulmonary:     Effort: Pulmonary effort is normal.  Abdominal:     General: Abdomen is flat.     Palpations: There is no mass.     Tenderness: There is no abdominal tenderness. There is no rebound.  Genitourinary:    General: Normal vulva.     Exam position: Lithotomy position.     Pubic Area: No rash or pubic lice.      Labia:        Right: No rash or lesion.        Left: No rash or lesion.      Vagina: Vaginal discharge present. No erythema, bleeding or lesions.     Cervix: No cervical  motion tenderness, discharge, friability, lesion or erythema.     Uterus: Normal.      Adnexa: Right adnexa normal and left adnexa normal.     Rectum: Normal.     Comments: pH = 4  Small amt of white discharge present  Lymphadenopathy:     Head:     Right side of head: No preauricular or posterior auricular adenopathy.     Left side of head: No preauricular or posterior auricular adenopathy.     Cervical: No cervical adenopathy.     Upper Body:     Right upper body: No supraclavicular, axillary or epitrochlear adenopathy.     Left upper body: No supraclavicular, axillary or epitrochlear adenopathy.     Lower Body: No right inguinal adenopathy. No left inguinal adenopathy.  Skin:    General: Skin is warm and dry.     Findings: No rash.  Neurological:     Mental Status: She is alert and oriented to person, place, and time.      Assessment and Plan:  Leslie Meadows is a 52 y.o. female presenting to the [facility] Department for STI screening  1. Screening for venereal disease (Primary)  - Chlamydia/Gonorrhea [facility] - [facility] - Syphilis Serology, [facility] - WET PREP FOR TRICH, YEAST, CLUE  2. Vulvovaginitis -pt reports vaginal discharge and itching that has not gone away despite diflucan -given clotrimazole today for vaginal irritation, encouraged to ask PCP for oral nystatin with continued tongue irritation   - clotrimazole (GYNE-LOTRIMIN) 1 % vaginal cream; Place 1 Applicatorful vaginally at bedtime for 7 days.  Patient accepted all screenings including vaginal CT/GC and bloodwork for HIV/RPR, and wet prep. Patient meets criteria for HepB screening? No. Ordered? not applicable Patient meets criteria for HepC screening? No. Ordered? not applicable  Treat wet prep per standing order Discussed time line for [facility] results and that patient will be called with positive results and encouraged patient to call if she had not heard in 2 weeks.   Counseled to return or seek care for continued or worsening symptoms Recommended repeat testing in 3 months with positive results. Recommended condom use with all sex  Patient is currently using  menopausal  to prevent pregnancy.    Return if symptoms worsen or fail to improve, for STI screening.  Future Appointments  Date Time Provider Department Center  03/22/2023  9:30 AM Scoggins, Joice Lofts, NP AMA-AMA None    Lenice Llamas, [place]

## 2023-02-21 ENCOUNTER — Ambulatory Visit (INDEPENDENT_AMBULATORY_CARE_PROVIDER_SITE_OTHER): Payer: 59 | Admitting: Cardiology

## 2023-02-21 ENCOUNTER — Encounter: Payer: Self-pay | Admitting: Cardiology

## 2023-02-21 VITALS — BP 120/80 | HR 100 | Temp 98.8°F | Ht 62.0 in | Wt 280.0 lb

## 2023-02-21 DIAGNOSIS — J0100 Acute maxillary sinusitis, unspecified: Secondary | ICD-10-CM | POA: Diagnosis not present

## 2023-02-21 DIAGNOSIS — Z0130 Encounter for examination of blood pressure without abnormal findings: Secondary | ICD-10-CM

## 2023-02-21 MED ORDER — AMOXICILLIN-POT CLAVULANATE 875-125 MG PO TABS: 1.0000 | ORAL_TABLET | Freq: Two times a day (BID) | ORAL | 0 refills | Status: AC

## 2023-02-21 MED ORDER — BENZONATATE 100 MG PO CAPS: 100.0000 mg | ORAL_CAPSULE | Freq: Three times a day (TID) | ORAL | 1 refills | Status: DC | PRN

## 2023-02-21 MED ORDER — FLUTICASONE PROPIONATE 50 MCG/ACT NA SUSP: 1.0000 | Freq: Every day | NASAL | 2 refills | Status: DC

## 2023-02-21 MED ORDER — NYSTATIN 100000 UNIT/ML MT SUSP: OROMUCOSAL | 0 refills | Status: DC

## 2023-02-21 NOTE — Progress Notes (Signed)
 Established Patient Office Visit  Subjective:  Patient ID: Leslie Meadows, female    DOB: [birth year]  Age: 53 y.o. MRN: [id number]  Chief Complaint  Patient presents with   Acute Visit    Cough and Congestion x 4 days    Patient in office for an acute visit. Patient complaining of cough and congestion for the past 4 days. Patient also reports chills, PND, and rhinorrhea. Patient tried alkaselzer plus and Zycam nasal spray with no relief. Will send in Augmentin  and tessalon  pearls, Flonase . Mucinex to thin secretions. Patient complaining of white substance on her tongue, states she was told by the health department it is yeast. Will send in oral Nystatin  to swish and spit.   Cough This is a new problem. The current episode started in the past 7 days. The problem has been unchanged. The problem occurs every few minutes. The cough is Productive of brown sputum. Associated symptoms include chills, nasal congestion, postnasal drip and rhinorrhea. Pertinent negatives include no chest pain, ear congestion, ear pain, fever, headaches, myalgias, sore throat or shortness of breath. Nothing aggravates the symptoms. Treatments tried: Alka selzer plus, Zycam. The treatment provided no relief.    No other concerns at this time.   Past Medical History:  Diagnosis Date   Chest pain 10/29/2020   Diabetes mellitus without complication (HCC)    HLD (hyperlipidemia)    Morbid obesity with BMI of 50.0-59.9, adult (HCC)    Neck pain 10/31/2020   Overweight 05/24/2022   Sleep apnea    Thyroid  disease     Past Surgical History:  Procedure Laterality Date   ABDOMINAL HYSTERECTOMY     ANTERIOR CERVICAL DECOMP/DISCECTOMY FUSION N/A 10/27/2020   Procedure: C4-5 ANTERIOR CERVICAL DECOMPRESSION/DISCECTOMY FUSION 1 LEVEL;  Surgeon: Bluford Standing, MD;  Location: [facility];  Service: Neurosurgery;  Laterality: N/A;   COLONOSCOPY WITH PROPOFOL  N/A 05/21/2022   Procedure: COLONOSCOPY WITH PROPOFOL ;  Surgeon:  Maryruth Ole DASEN, MD;  Location: [facility];  Service: Endoscopy;  Laterality: N/A;  DM   TUBAL LIGATION     ulna nerve decompression Left     Social History   Socioeconomic History   Marital status: Divorced    Spouse name: Not on file   Number of children: Not on file   Years of education: Not on file   Highest education level: Not on file  Occupational History   Not on file  Tobacco Use   Smoking status: Never   Smokeless tobacco: Never  Vaping Use   Vaping status: Never Used  Substance and Sexual Activity   Alcohol use: No    Alcohol/week: 0.0 standard drinks of alcohol   Drug use: No   Sexual activity: Not on file  Other Topics Concern   Not on file  Social History Narrative   Not on file   Social Drivers of Health   Financial Resource Strain: Not on file  Food Insecurity: Not on file  Transportation Needs: Not on file  Physical Activity: Not on file  Stress: Not on file  Social Connections: Not on file  Intimate Partner Violence: Not on file    Family History  Problem Relation Age of Onset   Breast cancer Paternal Grandmother     Allergies  Allergen Reactions   Trulicity  [Dulaglutide ] Nausea Only and Other (See Comments)    Abdominal pain    Outpatient Medications Prior to Visit  Medication Sig   Accu-Chek Softclix Lancets lancets Use as instructed   cyclobenzaprine  (  FLEXERIL ) 10 MG tablet Take 1 tablet (10 mg total) by mouth 3 (three) times daily as needed for muscle spasms.   Dulaglutide  (TRULICITY ) 0.75 MG/0.5ML SOPN Inject 0.75 mg into the skin once a week.   DULoxetine (CYMBALTA) 20 MG capsule Take 40 mg by mouth daily.   glipiZIDE  (GLUCOTROL  XL) 10 MG 24 hr tablet Take 1 tablet (10 mg total) by mouth daily with breakfast.   glucose blood (ACCU-CHEK GUIDE) test strip Use as instructed.  Check blood sugar in AM fasting and 2 hours after dinner   levothyroxine  (SYNTHROID ) 50 MCG tablet TAKE 1 TABLET BY MOUTH DAILY IN AM ON EMPTY STOMACH,  NOTE DOSAGE INCREASE   NON FORMULARY Pt uses a cpap nightly   rosuvastatin  (CRESTOR ) 40 MG tablet Take 1 tablet (40 mg total) by mouth at bedtime.   [DISCONTINUED] fluconazole  (DIFLUCAN ) 150 MG tablet Take one tablet every 72 hours (Patient not taking: Reported on 02/07/2023)   No facility-administered medications prior to visit.    Review of Systems  Constitutional:  Positive for chills. Negative for fever.  HENT:  Positive for congestion, postnasal drip and rhinorrhea. Negative for ear pain, sinus pain and sore throat.   Eyes: Negative.   Respiratory:  Positive for cough and sputum production. Negative for shortness of breath.   Cardiovascular: Negative.  Negative for chest pain.  Gastrointestinal: Negative.  Negative for abdominal pain, constipation and diarrhea.  Genitourinary: Negative.   Musculoskeletal:  Negative for joint pain and myalgias.  Skin: Negative.   Neurological: Negative.  Negative for dizziness and headaches.  Endo/Heme/Allergies: Negative.   Psychiatric/Behavioral:  Negative for depression. The patient is not nervous/anxious.   All other systems reviewed and are negative.      Objective:   BP 120/80   Pulse 100   Temp 98.8 F (37.1 C) (Tympanic)   Ht 5' 2 (1.575 m)   Wt 280 lb (127 kg)   SpO2 95%   BMI 51.21 kg/m   Vitals:   02/21/23 1043  BP: 120/80  Pulse: 100  Temp: 98.8 F (37.1 C)  Height: 5' 2 (1.575 m)  Weight: 280 lb (127 kg)  SpO2: 95%  TempSrc: Tympanic  BMI (Calculated): 51.2    Physical Exam Vitals and nursing note reviewed.  Constitutional:      Appearance: Normal appearance. She is normal weight.  HENT:     Head: Normocephalic and atraumatic.     Nose: Nose normal.     Mouth/Throat:     Mouth: Mucous membranes are moist.  Eyes:     Extraocular Movements: Extraocular movements intact.     Conjunctiva/sclera: Conjunctivae normal.     Pupils: Pupils are equal, round, and reactive to light.  Cardiovascular:     Rate and  Rhythm: Normal rate and regular rhythm.     Pulses: Normal pulses.     Heart sounds: Normal heart sounds.  Pulmonary:     Effort: Pulmonary effort is normal.     Breath sounds: Normal breath sounds.  Abdominal:     General: Abdomen is flat. Bowel sounds are normal.     Palpations: Abdomen is soft.  Musculoskeletal:        General: Normal range of motion.     Cervical back: Normal range of motion.  Skin:    General: Skin is warm and dry.  Neurological:     General: No focal deficit present.     Mental Status: She is alert and oriented to person, place, and time.  Psychiatric:        Mood and Affect: Mood normal.        Behavior: Behavior normal.        Thought Content: Thought content normal.        Judgment: Judgment normal.      No results found for any visits on 02/21/23.  Recent Results (from the past 2160 hours)  WET PREP FOR TRICH, YEAST, CLUE     Status: None   Collection Time: 02/07/23 12:00 AM  Result Value Ref Range   Trichomonas Exam Negative Negative   Yeast Exam Negative Negative   Clue Cell Exam Comment: Negative    Comment: NEG;AMINE NEG  HM HIV SCREENING LAB     Status: None   Collection Time: 02/07/23 12:00 AM  Result Value Ref Range   HM HIV Screening Negative - Validated       Assessment & Plan:  Augmentin  Tessalon  pearls Flonase  Mucinex Nystatin  oral Drink plenty of water  Problem List Items Addressed This Visit       Respiratory   Acute non-recurrent maxillary sinusitis - Primary   Relevant Medications   amoxicillin -clavulanate (AUGMENTIN ) 875-125 MG tablet   benzonatate  (TESSALON  PERLES) 100 MG capsule   fluticasone  (FLONASE ) 50 MCG/ACT nasal spray    Return if symptoms worsen or fail to improve.   Total time spent: 25 minutes  Google, NP  02/21/2023   This document may have been prepared by Dragon Voice Recognition software and as such may include unintentional dictation errors.

## 2023-02-21 NOTE — Patient Instructions (Signed)
 Nystatin oral  Augmentin antibiotic Flonase nasal spray Tessalon pearls for cough Mucinex Drink plenty of water

## 2023-02-22 ENCOUNTER — Other Ambulatory Visit: Payer: Self-pay

## 2023-02-22 MED ORDER — LEVOTHYROXINE SODIUM 50 MCG PO TABS: 50.0000 ug | ORAL_TABLET | Freq: Every day | ORAL | 1 refills | Status: DC

## 2023-03-03 ENCOUNTER — Encounter: Payer: Self-pay | Admitting: Cardiology

## 2023-03-09 ENCOUNTER — Telehealth: Payer: Self-pay | Admitting: Cardiology

## 2023-03-09 NOTE — Telephone Encounter (Signed)
Called because [facility] needs it to be mentioned in a note that patient is using her CPAP and benefiting from usage. She has appointment coming up on 2/11 and you can document it in that note. Just a heads up.

## 2023-03-22 ENCOUNTER — Ambulatory Visit: Payer: 59 | Admitting: Cardiology

## 2023-03-24 ENCOUNTER — Other Ambulatory Visit: Payer: Medicaid Other

## 2023-03-24 DIAGNOSIS — E039 Hypothyroidism, unspecified: Secondary | ICD-10-CM

## 2023-03-24 DIAGNOSIS — E785 Hyperlipidemia, unspecified: Secondary | ICD-10-CM

## 2023-03-24 DIAGNOSIS — E1169 Type 2 diabetes mellitus with other specified complication: Secondary | ICD-10-CM

## 2023-03-24 DIAGNOSIS — I10 Essential (primary) hypertension: Secondary | ICD-10-CM

## 2023-03-25 LAB — CMP14+EGFR
ALT: 30 [IU]/L (ref 0–32)
AST: 21 [IU]/L (ref 0–40)
Albumin: 4.3 g/dL (ref 3.8–4.9)
Alkaline Phosphatase: 77 [IU]/L (ref 44–121)
BUN/Creatinine Ratio: 10 (ref 9–23)
BUN: 10 mg/dL (ref 6–24)
Bilirubin Total: 0.5 mg/dL (ref 0.0–1.2)
CO2: 22 mmol/L (ref 20–29)
Calcium: 9.8 mg/dL (ref 8.7–10.2)
Chloride: 102 mmol/L (ref 96–106)
Creatinine, Ser: 0.98 mg/dL (ref 0.57–1.00)
Globulin, Total: 2.9 g/dL (ref 1.5–4.5)
Glucose: 92 mg/dL (ref 70–99)
Potassium: 4.4 mmol/L (ref 3.5–5.2)
Sodium: 140 mmol/L (ref 134–144)
Total Protein: 7.2 g/dL (ref 6.0–8.5)
eGFR: 69 mL/min/{1.73_m2} (ref >59)

## 2023-03-25 LAB — HEMOGLOBIN A1C
Est. average glucose Bld gHb Est-mCnc: 131 mg/dL
Hgb A1c MFr Bld: 6.2 % — ABNORMAL HIGH (ref 4.8–5.6)

## 2023-03-25 LAB — LIPID PANEL
Chol/HDL Ratio: 5 {ratio} — ABNORMAL HIGH (ref 0.0–4.4)
Cholesterol, Total: 241 mg/dL — ABNORMAL HIGH (ref 100–199)
HDL: 48 mg/dL (ref >39)
LDL Chol Calc (NIH): 179 mg/dL — ABNORMAL HIGH (ref 0–99)
Triglycerides: 83 mg/dL (ref 0–149)
VLDL Cholesterol Cal: 14 mg/dL (ref 5–40)

## 2023-03-25 LAB — TSH: TSH: 10.2 u[IU]/mL — ABNORMAL HIGH (ref 0.450–4.500)

## 2023-03-29 ENCOUNTER — Ambulatory Visit (INDEPENDENT_AMBULATORY_CARE_PROVIDER_SITE_OTHER): Payer: Medicaid Other | Admitting: Cardiology

## 2023-03-29 ENCOUNTER — Encounter: Payer: Self-pay | Admitting: Cardiology

## 2023-03-29 ENCOUNTER — Other Ambulatory Visit: Payer: Self-pay | Admitting: Cardiology

## 2023-03-29 VITALS — BP 110/70 | HR 95 | Ht 62.0 in | Wt 285.0 lb

## 2023-03-29 DIAGNOSIS — E039 Hypothyroidism, unspecified: Secondary | ICD-10-CM

## 2023-03-29 DIAGNOSIS — I10 Essential (primary) hypertension: Secondary | ICD-10-CM

## 2023-03-29 DIAGNOSIS — E66813 Obesity, class 3: Secondary | ICD-10-CM

## 2023-03-29 DIAGNOSIS — Z6841 Body Mass Index (BMI) 40.0 and over, adult: Secondary | ICD-10-CM

## 2023-03-29 DIAGNOSIS — E785 Hyperlipidemia, unspecified: Secondary | ICD-10-CM | POA: Diagnosis not present

## 2023-03-29 DIAGNOSIS — E1169 Type 2 diabetes mellitus with other specified complication: Secondary | ICD-10-CM

## 2023-03-29 MED ORDER — MOUNJARO 2.5 MG/0.5ML ~~LOC~~ SOAJ: 2.5000 mg | SUBCUTANEOUS | 3 refills | Status: DC

## 2023-03-29 NOTE — Progress Notes (Signed)
 Established Patient Office Visit  Subjective:  Patient ID: Leslie Meadows, female    DOB: Oct 27, 1970  Age: 53 y.o. MRN: [id number]  Chief Complaint  Patient presents with   Follow-up    2 Months Follow Up    Patient in office for 2 month follow up. Patient reports feeling well, no acute complaints today.  Patient using and benefiting from her CPAP machine.  Patient wanting to discuss weight loss medication. Patient not taking Trulicity due to nausea. Patient is a type II Diabetic, will try Mounjaro.  Discussed recent lab work. Hgb A1c stable. LDL elevated, patient admits to stopping her statin. Discussed importance of taking statin to lower LDL especially since she is diabetic. Patient states she will restart it.  Thyroid level abnormal. Patient received a letter from her pharmacy stating the levothyroxine she was taking was recalled. Patient had already finished the bottle. Will recheck TSH in 4 weeks.     No other concerns at this time.   Past Medical History:  Diagnosis Date   Chest pain 10/29/2020   Diabetes mellitus without complication (HCC)    HLD (hyperlipidemia)    Morbid obesity with BMI of 50.0-59.9, adult (HCC)    Neck pain 10/31/2020   Overweight 05/24/2022   Sleep apnea    Thyroid disease     Past Surgical History:  Procedure Laterality Date   ABDOMINAL HYSTERECTOMY     ANTERIOR CERVICAL DECOMP/DISCECTOMY FUSION N/A 10/27/2020   Procedure: C4-5 ANTERIOR CERVICAL DECOMPRESSION/DISCECTOMY FUSION 1 LEVEL;  Surgeon: Lucy Chris, MD;  Location: [facility];  Service: Neurosurgery;  Laterality: N/A;   COLONOSCOPY WITH PROPOFOL N/A 05/21/2022   Procedure: COLONOSCOPY WITH PROPOFOL;  Surgeon: Regis Bill, MD;  Location: [facility];  Service: Endoscopy;  Laterality: N/A;  DM   TUBAL LIGATION     ulna nerve decompression Left     Social History   Socioeconomic History   Marital status: Divorced    Spouse name: Not on file   Number of children: Not on  file   Years of education: Not on file   Highest education level: Not on file  Occupational History   Not on file  Tobacco Use   Smoking status: Never   Smokeless tobacco: Never  Vaping Use   Vaping status: Never Used  Substance and Sexual Activity   Alcohol use: No    Alcohol/week: 0.0 standard drinks of alcohol   Drug use: No   Sexual activity: Not on file  Other Topics Concern   Not on file  Social History Narrative   Not on file   Social Drivers of Health   Financial Resource Strain: Not on file  Food Insecurity: Not on file  Transportation Needs: Not on file  Physical Activity: Not on file  Stress: Not on file  Social Connections: Not on file  Intimate Partner Violence: Not on file    Family History  Problem Relation Age of Onset   Breast cancer Paternal Grandmother     Allergies  Allergen Reactions   Trulicity [Dulaglutide] Nausea Only and Other (See Comments)    Abdominal pain    Outpatient Medications Prior to Visit  Medication Sig   Accu-Chek Softclix Lancets lancets Use as instructed   benzonatate (TESSALON PERLES) 100 MG capsule Take 1 capsule (100 mg total) by mouth 3 (three) times daily as needed for cough.   cyclobenzaprine (FLEXERIL) 10 MG tablet Take 1 tablet (10 mg total) by mouth 3 (three) times daily as needed  for muscle spasms.   DULoxetine (CYMBALTA) 20 MG capsule Take 40 mg by mouth daily.   fluticasone (FLONASE) 50 MCG/ACT nasal spray Place 1 spray into both nostrils daily.   glipiZIDE (GLUCOTROL XL) 10 MG 24 hr tablet Take 1 tablet (10 mg total) by mouth daily with breakfast.   glucose blood (ACCU-CHEK GUIDE) test strip Use as instructed.  Check blood sugar in AM fasting and 2 hours after dinner   levothyroxine (SYNTHROID) 50 MCG tablet Take 1 tablet (50 mcg total) by mouth daily before breakfast.   NON FORMULARY Pt uses a cpap nightly   nystatin (MYCOSTATIN) 100000 UNIT/ML suspension Swish and spit 5 mLs (500,000 Units total) by mouth 4  (four) times daily.   rosuvastatin (CRESTOR) 40 MG tablet Take 1 tablet (40 mg total) by mouth at bedtime.   [DISCONTINUED] Dulaglutide (TRULICITY) 0.75 MG/0.5ML SOPN Inject 0.75 mg into the skin once a week.   No facility-administered medications prior to visit.    Review of Systems  Constitutional: Negative.   HENT: Negative.    Eyes: Negative.   Respiratory: Negative.  Negative for shortness of breath.   Cardiovascular: Negative.  Negative for chest pain.  Gastrointestinal: Negative.  Negative for abdominal pain, constipation and diarrhea.  Genitourinary: Negative.   Musculoskeletal:  Negative for joint pain and myalgias.  Skin: Negative.   Neurological: Negative.  Negative for dizziness and headaches.  Endo/Heme/Allergies: Negative.   All other systems reviewed and are negative.      Objective:   BP 110/70   Pulse 95   Ht 5\' 2"  (1.575 m)   Wt 285 lb (129.3 kg)   SpO2 95%   BMI 52.13 kg/m   Vitals:   03/29/23 1028  BP: 110/70  Pulse: 95  Height: 5\' 2"  (1.575 m)  Weight: 285 lb (129.3 kg)  SpO2: 95%  BMI (Calculated): 52.11    Physical Exam Vitals and nursing note reviewed.  Constitutional:      Appearance: Normal appearance. She is normal weight.  HENT:     Head: Normocephalic and atraumatic.     Nose: Nose normal.     Mouth/Throat:     Mouth: Mucous membranes are moist.  Eyes:     Extraocular Movements: Extraocular movements intact.     Conjunctiva/sclera: Conjunctivae normal.     Pupils: Pupils are equal, round, and reactive to light.  Cardiovascular:     Rate and Rhythm: Normal rate and regular rhythm.     Pulses: Normal pulses.     Heart sounds: Normal heart sounds.  Pulmonary:     Effort: Pulmonary effort is normal.     Breath sounds: Normal breath sounds.  Abdominal:     General: Abdomen is flat. Bowel sounds are normal.     Palpations: Abdomen is soft.  Musculoskeletal:        General: Normal range of motion.     Cervical back: Normal  range of motion.  Skin:    General: Skin is warm and dry.  Neurological:     General: No focal deficit present.     Mental Status: She is alert and oriented to person, place, and time.  Psychiatric:        Mood and Affect: Mood normal.        Behavior: Behavior normal.        Thought Content: Thought content normal.        Judgment: Judgment normal.      No results found for any visits on  03/29/23.  Recent Results (from the past 2160 hours)  WET PREP FOR TRICH, YEAST, CLUE     Status: None   Collection Time: 02/07/23 12:00 AM  Result Value Ref Range   Trichomonas Exam Negative Negative   Yeast Exam Negative Negative   Clue Cell Exam Comment: Negative    Comment: NEG;AMINE NEG  [facility] HIV SCREENING LAB     Status: None   Collection Time: 02/07/23 12:00 AM  Result Value Ref Range   HM HIV Screening Negative - Validated   Hemoglobin A1c     Status: Abnormal   Collection Time: 03/24/23 12:25 PM  Result Value Ref Range   Hgb A1c MFr Bld 6.2 (H) 4.8 - 5.6 %    Comment:          Prediabetes: 5.7 - 6.4          Diabetes: >6.4          Glycemic control for adults with diabetes: <7.0    Est. average glucose Bld gHb Est-mCnc 131 mg/dL  TSH     Status: Abnormal   Collection Time: 03/24/23 12:25 PM  Result Value Ref Range   TSH 10.200 (H) 0.450 - 4.500 uIU/mL  CMP14+EGFR     Status: None   Collection Time: 03/24/23 12:25 PM  Result Value Ref Range   Glucose 92 70 - 99 mg/dL   BUN 10 6 - 24 mg/dL   Creatinine, Ser 8.29 0.57 - 1.00 mg/dL   eGFR 69 >56 OZ/HYQ/6.57   BUN/Creatinine Ratio 10 9 - 23   Sodium 140 134 - 144 mmol/L   Potassium 4.4 3.5 - 5.2 mmol/L   Chloride 102 96 - 106 mmol/L   CO2 22 20 - 29 mmol/L   Calcium 9.8 8.7 - 10.2 mg/dL   Total Protein 7.2 6.0 - 8.5 g/dL   Albumin 4.3 3.8 - 4.9 g/dL   Globulin, Total 2.9 1.5 - 4.5 g/dL   Bilirubin Total 0.5 0.0 - 1.2 mg/dL   Alkaline Phosphatase 77 44 - 121 IU/L   AST 21 0 - 40 IU/L   ALT 30 0 - 32 IU/L  Lipid panel      Status: Abnormal   Collection Time: 03/24/23 12:25 PM  Result Value Ref Range   Cholesterol, Total 241 (H) 100 - 199 mg/dL   Triglycerides 83 0 - 149 mg/dL   HDL 48 >84 mg/dL   VLDL Cholesterol Cal 14 5 - 40 mg/dL   LDL Chol Calc (NIH) 696 (H) 0 - 99 mg/dL   Chol/HDL Ratio 5.0 (H) 0.0 - 4.4 ratio    Comment:                                   T. Chol/HDL Ratio                                             Men  Women                               1/2 Avg.Risk  3.4    3.3  Avg.Risk  5.0    4.4                                2X Avg.Risk  9.6    7.1                                3X Avg.Risk 23.4   11.0       Assessment & Plan:  Using and benefiting from CPAP machine. Mounjaro for DM and weight loss.  Restart statin. Recheck TSH in 4 weeks.   Problem List Items Addressed This Visit       Cardiovascular and Mediastinum   Essential hypertension, benign - Primary     Endocrine   Primary hypothyroidism   Relevant Orders   TSH   Diabetes mellitus (HCC)   Relevant Medications   tirzepatide (MOUNJARO) 2.5 MG/0.5ML Pen     Other   Obesity, unspecified   Relevant Medications   tirzepatide (MOUNJARO) 2.5 MG/0.5ML Pen   HLD (hyperlipidemia)    Return in about 5 weeks (around 05/03/2023).   Total time spent: 25 minutes  Google, NP  03/29/2023   This document may have been prepared by Dragon Voice Recognition software and as such may include unintentional dictation errors.

## 2023-04-21 ENCOUNTER — Other Ambulatory Visit: Payer: Self-pay

## 2023-04-21 MED ORDER — ACCU-CHEK GUIDE TEST VI STRP: ORAL_STRIP | 12 refills | Status: AC

## 2023-04-22 ENCOUNTER — Other Ambulatory Visit

## 2023-04-22 ENCOUNTER — Other Ambulatory Visit: Payer: Self-pay | Admitting: Cardiology

## 2023-04-22 DIAGNOSIS — E039 Hypothyroidism, unspecified: Secondary | ICD-10-CM

## 2023-04-24 LAB — TSH: TSH: 3.83 u[IU]/mL (ref 0.450–4.500)

## 2023-04-26 ENCOUNTER — Encounter: Payer: Self-pay | Admitting: Cardiology

## 2023-04-26 ENCOUNTER — Ambulatory Visit: Payer: Medicaid Other | Admitting: Cardiology

## 2023-04-26 VITALS — BP 128/71 | HR 77 | Ht 62.0 in | Wt 287.0 lb

## 2023-04-26 DIAGNOSIS — Z713 Dietary counseling and surveillance: Secondary | ICD-10-CM

## 2023-04-26 DIAGNOSIS — E1169 Type 2 diabetes mellitus with other specified complication: Secondary | ICD-10-CM | POA: Diagnosis not present

## 2023-04-26 DIAGNOSIS — E039 Hypothyroidism, unspecified: Secondary | ICD-10-CM

## 2023-04-26 DIAGNOSIS — I10 Essential (primary) hypertension: Secondary | ICD-10-CM | POA: Diagnosis not present

## 2023-04-26 DIAGNOSIS — E66813 Obesity, class 3: Secondary | ICD-10-CM

## 2023-04-26 DIAGNOSIS — Z6841 Body Mass Index (BMI) 40.0 and over, adult: Secondary | ICD-10-CM

## 2023-04-26 LAB — POCT CBG (FASTING - GLUCOSE)-MANUAL ENTRY: Glucose Fasting, POC: 100 mg/dL — AB (ref 70–99)

## 2023-04-26 MED ORDER — LEVOTHYROXINE SODIUM 50 MCG PO TABS: 50.0000 ug | ORAL_TABLET | Freq: Every day | ORAL | 1 refills | Status: AC

## 2023-04-26 MED ORDER — MOUNJARO 2.5 MG/0.5ML ~~LOC~~ SOAJ: 2.5000 mg | SUBCUTANEOUS | 3 refills | Status: DC

## 2023-04-26 NOTE — Progress Notes (Signed)
 Established Patient Office Visit  Subjective:  Patient ID: Leslie Meadows, female    DOB: 11-Jul-1970  Age: 53 y.o. MRN: [id number]  Chief Complaint  Patient presents with   Follow-up    4 week follow  up    Patient in office for 4 week follow up. Patient was prescribed Mounjaro at previous visit, patient did not start, unsure why. Unknown if PA required, will resent.  TSH recheck normal.  Patient feeling well today, no complaints.     No other concerns at this time.   Past Medical History:  Diagnosis Date   Chest pain 10/29/2020   Diabetes mellitus without complication (HCC)    HLD (hyperlipidemia)    Morbid obesity with BMI of 50.0-59.9, adult (HCC)    Neck pain 10/31/2020   Overweight 05/24/2022   Sleep apnea    Thyroid disease     Past Surgical History:  Procedure Laterality Date   ABDOMINAL HYSTERECTOMY     ANTERIOR CERVICAL DECOMP/DISCECTOMY FUSION N/A 10/27/2020   Procedure: C4-5 ANTERIOR CERVICAL DECOMPRESSION/DISCECTOMY FUSION 1 LEVEL;  Surgeon: Lucy Chris, MD;  Location: [facility];  Service: Neurosurgery;  Laterality: N/A;   COLONOSCOPY WITH PROPOFOL N/A 05/21/2022   Procedure: COLONOSCOPY WITH PROPOFOL;  Surgeon: Regis Bill, MD;  Location: [facility];  Service: Endoscopy;  Laterality: N/A;  DM   TUBAL LIGATION     ulna nerve decompression Left     Social History   Socioeconomic History   Marital status: Divorced    Spouse name: Not on file   Number of children: Not on file   Years of education: Not on file   Highest education level: Not on file  Occupational History   Not on file  Tobacco Use   Smoking status: Never   Smokeless tobacco: Never  Vaping Use   Vaping status: Never Used  Substance and Sexual Activity   Alcohol use: No    Alcohol/week: 0.0 standard drinks of alcohol   Drug use: No   Sexual activity: Not on file  Other Topics Concern   Not on file  Social History Narrative   Not on file   Social Drivers of Health    Financial Resource Strain: Not on file  Food Insecurity: Not on file  Transportation Needs: Not on file  Physical Activity: Not on file  Stress: Not on file  Social Connections: Not on file  Intimate Partner Violence: Not on file    Family History  Problem Relation Age of Onset   Breast cancer Paternal Grandmother     Allergies  Allergen Reactions   Trulicity [Dulaglutide] Nausea Only and Other (See Comments)    Abdominal pain    Outpatient Medications Prior to Visit  Medication Sig   Accu-Chek Softclix Lancets lancets Use as instructed   cyclobenzaprine (FLEXERIL) 10 MG tablet Take 1 tablet (10 mg total) by mouth 3 (three) times daily as needed for muscle spasms.   DULoxetine (CYMBALTA) 20 MG capsule Take 40 mg by mouth daily.   fluticasone (FLONASE) 50 MCG/ACT nasal spray Place 1 spray into both nostrils daily.   glipiZIDE (GLUCOTROL XL) 10 MG 24 hr tablet Take 1 tablet (10 mg total) by mouth daily with breakfast.   glucose blood (ACCU-CHEK GUIDE TEST) test strip Use to check sugars once daily in the morning and again 2 hours after dinner   NON FORMULARY Pt uses a cpap nightly   nystatin (MYCOSTATIN) 100000 UNIT/ML suspension Swish and spit 5 mLs (500,000 Units total)  by mouth 4 (four) times daily.   rosuvastatin (CRESTOR) 40 MG tablet Take 1 tablet (40 mg total) by mouth at bedtime.   [DISCONTINUED] levothyroxine (SYNTHROID) 50 MCG tablet Take 1 tablet (50 mcg total) by mouth daily before breakfast.   [DISCONTINUED] tirzepatide (MOUNJARO) 2.5 MG/0.5ML Pen INJECT 2.5 MG SUBCUTANEOUSLY WEEKLY   No facility-administered medications prior to visit.    Review of Systems  Constitutional: Negative.   HENT: Negative.    Eyes: Negative.   Respiratory: Negative.  Negative for shortness of breath.   Cardiovascular: Negative.  Negative for chest pain.  Gastrointestinal: Negative.  Negative for abdominal pain, constipation and diarrhea.  Genitourinary: Negative.    Musculoskeletal:  Negative for joint pain and myalgias.  Skin: Negative.   Neurological: Negative.  Negative for dizziness and headaches.  Endo/Heme/Allergies: Negative.   All other systems reviewed and are negative.      Objective:   BP 128/71   Pulse 77   Ht 5\' 2"  (1.575 m)   Wt 287 lb (130.2 kg)   SpO2 98%   BMI 52.49 kg/m   Vitals:   04/26/23 1049  BP: 128/71  Pulse: 77  Height: 5\' 2"  (1.575 m)  Weight: 287 lb (130.2 kg)  SpO2: 98%  BMI (Calculated): 52.48    Physical Exam Vitals and nursing note reviewed.  Constitutional:      Appearance: Normal appearance. She is normal weight.  HENT:     Head: Normocephalic and atraumatic.     Nose: Nose normal.     Mouth/Throat:     Mouth: Mucous membranes are moist.  Eyes:     Extraocular Movements: Extraocular movements intact.     Conjunctiva/sclera: Conjunctivae normal.     Pupils: Pupils are equal, round, and reactive to light.  Cardiovascular:     Rate and Rhythm: Normal rate and regular rhythm.     Pulses: Normal pulses.     Heart sounds: Normal heart sounds.  Pulmonary:     Effort: Pulmonary effort is normal.     Breath sounds: Normal breath sounds.  Abdominal:     General: Abdomen is flat. Bowel sounds are normal.     Palpations: Abdomen is soft.  Musculoskeletal:        General: Normal range of motion.     Cervical back: Normal range of motion.  Skin:    General: Skin is warm and dry.  Neurological:     General: No focal deficit present.     Mental Status: She is alert and oriented to person, place, and time.  Psychiatric:        Mood and Affect: Mood normal.        Behavior: Behavior normal.        Thought Content: Thought content normal.        Judgment: Judgment normal.      Results for orders placed or performed in visit on 04/26/23  POCT CBG (Fasting - Glucose)  Result Value Ref Range   Glucose Fasting, POC 100 (A) 70 - 99 mg/dL    Recent Results (from the past 2160 hours)  WET PREP  FOR TRICH, YEAST, CLUE     Status: None   Collection Time: 02/07/23 12:00 AM  Result Value Ref Range   Trichomonas Exam Negative Negative   Yeast Exam Negative Negative   Clue Cell Exam Comment: Negative    Comment: NEG;AMINE NEG  HM HIV SCREENING LAB     Status: None   Collection Time: 02/07/23 12:00  AM  Result Value Ref Range   HM HIV Screening Negative - Validated   Hemoglobin A1c     Status: Abnormal   Collection Time: 03/24/23 12:25 PM  Result Value Ref Range   Hgb A1c MFr Bld 6.2 (H) 4.8 - 5.6 %    Comment:          Prediabetes: 5.7 - 6.4          Diabetes: >6.4          Glycemic control for adults with diabetes: <7.0    Est. average glucose Bld gHb Est-mCnc 131 mg/dL  TSH     Status: Abnormal   Collection Time: 03/24/23 12:25 PM  Result Value Ref Range   TSH 10.200 (H) 0.450 - 4.500 uIU/mL  CMP14+EGFR     Status: None   Collection Time: 03/24/23 12:25 PM  Result Value Ref Range   Glucose 92 70 - 99 mg/dL   BUN 10 6 - 24 mg/dL   Creatinine, Ser 8.65 0.57 - 1.00 mg/dL   eGFR 69 >78 IO/NGE/9.52   BUN/Creatinine Ratio 10 9 - 23   Sodium 140 134 - 144 mmol/L   Potassium 4.4 3.5 - 5.2 mmol/L   Chloride 102 96 - 106 mmol/L   CO2 22 20 - 29 mmol/L   Calcium 9.8 8.7 - 10.2 mg/dL   Total Protein 7.2 6.0 - 8.5 g/dL   Albumin 4.3 3.8 - 4.9 g/dL   Globulin, Total 2.9 1.5 - 4.5 g/dL   Bilirubin Total 0.5 0.0 - 1.2 mg/dL   Alkaline Phosphatase 77 44 - 121 IU/L   AST 21 0 - 40 IU/L   ALT 30 0 - 32 IU/L  Lipid panel     Status: Abnormal   Collection Time: 03/24/23 12:25 PM  Result Value Ref Range   Cholesterol, Total 241 (H) 100 - 199 mg/dL   Triglycerides 83 0 - 149 mg/dL   HDL 48 >84 mg/dL   VLDL Cholesterol Cal 14 5 - 40 mg/dL   LDL Chol Calc (NIH) 132 (H) 0 - 99 mg/dL   Chol/HDL Ratio 5.0 (H) 0.0 - 4.4 ratio    Comment:                                   T. Chol/HDL Ratio                                             Men  Women                               1/2 Avg.Risk   3.4    3.3                                   Avg.Risk  5.0    4.4                                2X Avg.Risk  9.6    7.1  3X Avg.Risk 23.4   11.0   TSH     Status: None   Collection Time: 04/22/23  4:05 PM  Result Value Ref Range   TSH 3.830 0.450 - 4.500 uIU/mL  POCT CBG (Fasting - Glucose)     Status: Abnormal   Collection Time: 04/26/23 10:52 AM  Result Value Ref Range   Glucose Fasting, POC 100 (A) 70 - 99 mg/dL      Assessment & Plan:  Follow up after starting Mounjaro.  Problem List Items Addressed This Visit       Cardiovascular and Mediastinum   Essential hypertension, benign     Endocrine   Primary hypothyroidism   Relevant Medications   levothyroxine (SYNTHROID) 50 MCG tablet   Diabetes mellitus (HCC) - Primary   Relevant Medications   tirzepatide (MOUNJARO) 2.5 MG/0.5ML Pen   Other Relevant Orders   POCT CBG (Fasting - Glucose) (Completed)     Other   Obesity, unspecified   Relevant Medications   tirzepatide (MOUNJARO) 2.5 MG/0.5ML Pen   Weight loss counseling, encounter for    Return in about 2 months (around 06/26/2023).   Total time spent: 25 minutes  Google, NP  04/26/2023   This document may have been prepared by Dragon Voice Recognition software and as such may include unintentional dictation errors.

## 2023-05-12 ENCOUNTER — Encounter: Payer: Self-pay | Admitting: Cardiology

## 2023-05-18 ENCOUNTER — Other Ambulatory Visit: Payer: Self-pay | Admitting: Cardiology

## 2023-06-27 ENCOUNTER — Ambulatory Visit: Admitting: Cardiology

## 2023-06-28 ENCOUNTER — Encounter: Payer: Self-pay | Admitting: Cardiology

## 2023-06-28 ENCOUNTER — Ambulatory Visit: Admitting: Cardiology

## 2023-06-28 VITALS — BP 110/81 | HR 91 | Ht 62.0 in | Wt 291.4 lb

## 2023-06-28 DIAGNOSIS — Z6841 Body Mass Index (BMI) 40.0 and over, adult: Secondary | ICD-10-CM

## 2023-06-28 DIAGNOSIS — I10 Essential (primary) hypertension: Secondary | ICD-10-CM

## 2023-06-28 DIAGNOSIS — E1169 Type 2 diabetes mellitus with other specified complication: Secondary | ICD-10-CM | POA: Diagnosis not present

## 2023-06-28 DIAGNOSIS — E039 Hypothyroidism, unspecified: Secondary | ICD-10-CM | POA: Diagnosis not present

## 2023-06-28 DIAGNOSIS — E785 Hyperlipidemia, unspecified: Secondary | ICD-10-CM | POA: Diagnosis not present

## 2023-06-28 DIAGNOSIS — E66813 Obesity, class 3: Secondary | ICD-10-CM

## 2023-06-28 DIAGNOSIS — Z713 Dietary counseling and surveillance: Secondary | ICD-10-CM

## 2023-06-28 MED ORDER — MOUNJARO 5 MG/0.5ML ~~LOC~~ SOAJ: 5.0000 mg | SUBCUTANEOUS | 12 refills | Status: DC

## 2023-06-28 NOTE — Progress Notes (Signed)
 Established Patient Office Visit  Subjective:  Patient ID: Leslie Meadows, female    DOB: 03-Apr-1970  Age: 53 y.o. MRN: [id number]  Chief Complaint  Patient presents with   Follow-up    2 Months Follow Up    Patient in office for 2 month follow up. Patient did not have blood work done. Will get fasting lab work today and call with results.  Patient started Mounjaro , has taken 4 injections. Patient tolerating. Will increase dose to 5 mg weekly.  Discussed making dietary changes for long term weight loss.    No other concerns at this time.   Past Medical History:  Diagnosis Date   Chest pain 10/29/2020   Diabetes mellitus without complication (HCC)    HLD (hyperlipidemia)    Morbid obesity with BMI of 50.0-59.9, adult (HCC)    Neck pain 10/31/2020   Overweight 05/24/2022   Sleep apnea    Thyroid  disease     Past Surgical History:  Procedure Laterality Date   ABDOMINAL HYSTERECTOMY     ANTERIOR CERVICAL DECOMP/DISCECTOMY FUSION N/A 10/27/2020   Procedure: C4-5 ANTERIOR CERVICAL DECOMPRESSION/DISCECTOMY FUSION 1 LEVEL;  Surgeon: Berta Brittle, MD;  Location: [facility];  Service: Neurosurgery;  Laterality: N/A;   COLONOSCOPY WITH PROPOFOL  N/A 05/21/2022   Procedure: COLONOSCOPY WITH PROPOFOL ;  Surgeon: Shane Darling, MD;  Location: [facility];  Service: Endoscopy;  Laterality: N/A;  DM   TUBAL LIGATION     ulna nerve decompression Left     Social History   Socioeconomic History   Marital status: Divorced    Spouse name: Not on file   Number of children: Not on file   Years of education: Not on file   Highest education level: Not on file  Occupational History   Not on file  Tobacco Use   Smoking status: Never   Smokeless tobacco: Never  Vaping Use   Vaping status: Never Used  Substance and Sexual Activity   Alcohol use: No    Alcohol/week: 0.0 standard drinks of alcohol   Drug use: No   Sexual activity: Not on file  Other Topics Concern   Not on file   Social History Narrative   Not on file   Social Drivers of Health   Financial Resource Strain: Not on file  Food Insecurity: Not on file  Transportation Needs: Not on file  Physical Activity: Not on file  Stress: Not on file  Social Connections: Not on file  Intimate Partner Violence: Not on file    Family History  Problem Relation Age of Onset   Breast cancer Paternal Grandmother     Allergies  Allergen Reactions   Trulicity  [Dulaglutide ] Nausea Only and Other (See Comments)    Abdominal pain    Outpatient Medications Prior to Visit  Medication Sig   Accu-Chek Softclix Lancets lancets Use as instructed   cyclobenzaprine  (FLEXERIL ) 10 MG tablet Take 1 tablet (10 mg total) by mouth 3 (three) times daily as needed for muscle spasms.   DULoxetine (CYMBALTA) 20 MG capsule Take 40 mg by mouth daily.   fluticasone  (FLONASE ) 50 MCG/ACT nasal spray Place 1 spray into both nostrils daily.   glipiZIDE  (GLUCOTROL  XL) 10 MG 24 hr tablet TAKE 1 TABLET (10 MG TOTAL) BY MOUTH DAILY WITH BREAKFAST.   glucose blood (ACCU-CHEK GUIDE TEST) test strip Use to check sugars once daily in the morning and again 2 hours after dinner   levothyroxine  (SYNTHROID ) 50 MCG tablet Take 1 tablet (50 mcg  total) by mouth daily before breakfast.   NON FORMULARY Pt uses a cpap nightly   nystatin  (MYCOSTATIN ) 100000 UNIT/ML suspension Swish and spit 5 mLs (500,000 Units total) by mouth 4 (four) times daily.   rosuvastatin  (CRESTOR ) 40 MG tablet Take 1 tablet (40 mg total) by mouth at bedtime.   [DISCONTINUED] tirzepatide  (MOUNJARO ) 2.5 MG/0.5ML Pen Inject 2.5 mg into the skin once a week.   No facility-administered medications prior to visit.    Review of Systems  Constitutional: Negative.   HENT: Negative.    Eyes: Negative.   Respiratory: Negative.  Negative for shortness of breath.   Cardiovascular: Negative.  Negative for chest pain.  Gastrointestinal: Negative.  Negative for abdominal pain,  constipation and diarrhea.  Genitourinary: Negative.   Musculoskeletal:  Negative for joint pain and myalgias.  Skin: Negative.   Neurological: Negative.  Negative for dizziness and headaches.  Endo/Heme/Allergies: Negative.   All other systems reviewed and are negative.      Objective:   BP 110/81   Pulse 91   Ht 5\' 2"  (1.575 m)   Wt 291 lb 6.4 oz (132.2 kg)   SpO2 96%   BMI 53.30 kg/m   Vitals:   06/28/23 0948  BP: 110/81  Pulse: 91  Height: 5\' 2"  (1.575 m)  Weight: 291 lb 6.4 oz (132.2 kg)  SpO2: 96%  BMI (Calculated): 53.28    Physical Exam Vitals and nursing note reviewed.  Constitutional:      Appearance: Normal appearance. She is normal weight.  HENT:     Head: Normocephalic and atraumatic.     Nose: Nose normal.     Mouth/Throat:     Mouth: Mucous membranes are moist.  Eyes:     Extraocular Movements: Extraocular movements intact.     Conjunctiva/sclera: Conjunctivae normal.     Pupils: Pupils are equal, round, and reactive to light.  Cardiovascular:     Rate and Rhythm: Normal rate and regular rhythm.     Pulses: Normal pulses.     Heart sounds: Normal heart sounds.  Pulmonary:     Effort: Pulmonary effort is normal.     Breath sounds: Normal breath sounds.  Abdominal:     General: Abdomen is flat. Bowel sounds are normal.     Palpations: Abdomen is soft.  Musculoskeletal:        General: Normal range of motion.     Cervical back: Normal range of motion.  Skin:    General: Skin is warm and dry.  Neurological:     General: No focal deficit present.     Mental Status: She is alert and oriented to person, place, and time.  Psychiatric:        Mood and Affect: Mood normal.        Behavior: Behavior normal.        Thought Content: Thought content normal.        Judgment: Judgment normal.      No results found for any visits on 06/28/23.  Recent Results (from the past 2160 hours)  TSH     Status: None   Collection Time: 04/22/23  4:05 PM   Result Value Ref Range   TSH 3.830 0.450 - 4.500 uIU/mL  POCT CBG (Fasting - Glucose)     Status: Abnormal   Collection Time: 04/26/23 10:52 AM  Result Value Ref Range   Glucose Fasting, POC 100 (A) 70 - 99 mg/dL      Assessment & Plan:  Fasting  lab work today. Increase Mounjaro  to 5 mg weekly  Problem List Items Addressed This Visit       Cardiovascular and Mediastinum   Essential hypertension, benign - Primary   Relevant Orders   CMP14+EGFR     Endocrine   Primary hypothyroidism   Relevant Orders   TSH   Diabetes mellitus (HCC)   Relevant Medications   tirzepatide  (MOUNJARO ) 5 MG/0.5ML Pen   Other Relevant Orders   Hemoglobin A1c     Other   Obesity, unspecified   Relevant Medications   tirzepatide  (MOUNJARO ) 5 MG/0.5ML Pen   HLD (hyperlipidemia)   Relevant Orders   Lipid Profile   Weight loss counseling, encounter for    Return in about 4 months (around 10/29/2023) for with fasting labs prior.   Total time spent: 25 minutes  Google, NP  06/28/2023   This document may have been prepared by Dragon Voice Recognition software and as such may include unintentional dictation errors.

## 2023-06-29 ENCOUNTER — Ambulatory Visit: Payer: Self-pay | Admitting: Cardiology

## 2023-06-29 LAB — HEMOGLOBIN A1C
Est. average glucose Bld gHb Est-mCnc: 126 mg/dL
Hgb A1c MFr Bld: 6 % — ABNORMAL HIGH (ref 4.8–5.6)

## 2023-06-29 LAB — CMP14+EGFR
ALT: 35 IU/L — ABNORMAL HIGH (ref 0–32)
AST: 30 IU/L (ref 0–40)
Albumin: 4.2 g/dL (ref 3.8–4.9)
Alkaline Phosphatase: 71 IU/L (ref 44–121)
BUN/Creatinine Ratio: 12 (ref 9–23)
BUN: 11 mg/dL (ref 6–24)
Bilirubin Total: 0.5 mg/dL (ref 0.0–1.2)
CO2: 22 mmol/L (ref 20–29)
Calcium: 9.5 mg/dL (ref 8.7–10.2)
Chloride: 105 mmol/L (ref 96–106)
Creatinine, Ser: 0.94 mg/dL (ref 0.57–1.00)
Globulin, Total: 2.5 g/dL (ref 1.5–4.5)
Glucose: 83 mg/dL (ref 70–99)
Potassium: 4.3 mmol/L (ref 3.5–5.2)
Sodium: 141 mmol/L (ref 134–144)
Total Protein: 6.7 g/dL (ref 6.0–8.5)
eGFR: 73 mL/min/{1.73_m2} (ref >59)

## 2023-06-29 LAB — LIPID PANEL
Chol/HDL Ratio: 4.8 ratio — ABNORMAL HIGH (ref 0.0–4.4)
Cholesterol, Total: 212 mg/dL — ABNORMAL HIGH (ref 100–199)
HDL: 44 mg/dL (ref >39)
LDL Chol Calc (NIH): 149 mg/dL — ABNORMAL HIGH (ref 0–99)
Triglycerides: 104 mg/dL (ref 0–149)
VLDL Cholesterol Cal: 19 mg/dL (ref 5–40)

## 2023-06-29 LAB — TSH: TSH: 2.47 u[IU]/mL (ref 0.450–4.500)

## 2023-07-07 ENCOUNTER — Other Ambulatory Visit: Payer: Self-pay

## 2023-07-07 DIAGNOSIS — G540 Brachial plexus disorders: Secondary | ICD-10-CM

## 2023-07-11 ENCOUNTER — Ambulatory Visit
Admission: RE | Admit: 2023-07-11 | Discharge: 2023-07-11 | Disposition: A | Discharge status: Home / Self Care | Payer: Worker's Compensation | Source: Ambulatory Visit

## 2023-07-11 DIAGNOSIS — G540 Brachial plexus disorders: Secondary | ICD-10-CM

## 2023-07-11 MED ORDER — IOPAMIDOL (ISOVUE-370) INJECTION 76%
75.0000 mL | Freq: Once | INTRAVENOUS | Status: AC | PRN
  Administered 2023-07-11: 75 mL via INTRAVENOUS

## 2023-07-13 NOTE — Progress Notes (Signed)
 Patient presented to office today 07/13/2023 following CTA Chest on 07/11/2023- Complaint is pain and swelling to left mid-forearm at the site of the IV -This reporter observed an oval area of bruising surrounding the IV access puncture site.  No palpable knot, no blistering or oozing.  Cindi, our RN also evaluated patient and concurred, no palpable knot, approximately 3 inch oval of bruising surrounding the IV site.  Heat compresses recommended by Cindi to patient. Overall, reassuring as no evidence of infection/complications.

## 2023-07-22 ENCOUNTER — Other Ambulatory Visit: Payer: Self-pay | Admitting: Cardiology

## 2023-07-22 ENCOUNTER — Encounter: Payer: Self-pay | Admitting: Cardiology

## 2023-07-22 MED ORDER — MOUNJARO 7.5 MG/0.5ML ~~LOC~~ SOAJ: 7.5000 mg | SUBCUTANEOUS | 4 refills | Status: DC

## 2023-08-25 ENCOUNTER — Encounter: Payer: Self-pay | Admitting: Cardiology

## 2023-09-21 ENCOUNTER — Ambulatory Visit: Admitting: Cardiology

## 2023-09-21 ENCOUNTER — Encounter: Payer: Self-pay | Admitting: Cardiology

## 2023-09-21 VITALS — BP 114/82 | HR 90 | Ht 62.0 in | Wt 279.0 lb

## 2023-09-21 DIAGNOSIS — E039 Hypothyroidism, unspecified: Secondary | ICD-10-CM

## 2023-09-21 DIAGNOSIS — I10 Essential (primary) hypertension: Secondary | ICD-10-CM

## 2023-09-21 DIAGNOSIS — E785 Hyperlipidemia, unspecified: Secondary | ICD-10-CM

## 2023-09-21 DIAGNOSIS — E1169 Type 2 diabetes mellitus with other specified complication: Secondary | ICD-10-CM | POA: Diagnosis not present

## 2023-09-21 DIAGNOSIS — R5383 Other fatigue: Secondary | ICD-10-CM | POA: Insufficient documentation

## 2023-09-21 NOTE — Progress Notes (Signed)
 Established Patient Office Visit  Subjective:  Patient ID: Leslie Meadows, female    DOB: 06-06-1970  Age: 53 y.o. MRN: [id number]  Chief Complaint  Patient presents with   Fatigue    Patient in office for an acute visit, complaining of fatigue. Patient states she is sleeping a lot and still feeling tired. States she is compliant with her CPAP. Patient due for blood work next week. Will add additional lab work.  Patient not currently taking her statin, restart.     No other concerns at this time.   Past Medical History:  Diagnosis Date   Chest pain 10/29/2020   Diabetes mellitus without complication (HCC)    HLD (hyperlipidemia)    Morbid obesity with BMI of 50.0-59.9, adult (HCC)    Neck pain 10/31/2020   Overweight 05/24/2022   Sleep apnea    Thyroid  disease     Past Surgical History:  Procedure Laterality Date   ABDOMINAL HYSTERECTOMY     ANTERIOR CERVICAL DECOMP/DISCECTOMY FUSION N/A 10/27/2020   Procedure: C4-5 ANTERIOR CERVICAL DECOMPRESSION/DISCECTOMY FUSION 1 LEVEL;  Surgeon: Bluford Standing, MD;  Location: [facility];  Service: Neurosurgery;  Laterality: N/A;   COLONOSCOPY WITH PROPOFOL  N/A 05/21/2022   Procedure: COLONOSCOPY WITH PROPOFOL ;  Surgeon: Maryruth Ole DASEN, MD;  Location: [facility];  Service: Endoscopy;  Laterality: N/A;  DM   TUBAL LIGATION     ulna nerve decompression Left     Social History   Socioeconomic History   Marital status: Divorced    Spouse name: Not on file   Number of children: Not on file   Years of education: Not on file   Highest education level: Not on file  Occupational History   Not on file  Tobacco Use   Smoking status: Never   Smokeless tobacco: Never  Vaping Use   Vaping status: Never Used  Substance and Sexual Activity   Alcohol use: No    Alcohol/week: 0.0 standard drinks of alcohol   Drug use: No   Sexual activity: Not on file  Other Topics Concern   Not on file  Social History Narrative   Not on file    Social Drivers of Health   Financial Resource Strain: Not on file  Food Insecurity: Not on file  Transportation Needs: Not on file  Physical Activity: Not on file  Stress: Not on file  Social Connections: Not on file  Intimate Partner Violence: Not on file    Family History  Problem Relation Age of Onset   Breast cancer Paternal Grandmother     Allergies  Allergen Reactions   Trulicity  [Dulaglutide ] Nausea Only and Other (See Comments)    Abdominal pain    Outpatient Medications Prior to Visit  Medication Sig   Accu-Chek Softclix Lancets lancets Use as instructed   cyclobenzaprine  (FLEXERIL ) 10 MG tablet Take 1 tablet (10 mg total) by mouth 3 (three) times daily as needed for muscle spasms.   glipiZIDE  (GLUCOTROL  XL) 10 MG 24 hr tablet TAKE 1 TABLET (10 MG TOTAL) BY MOUTH DAILY WITH BREAKFAST.   glucose blood (ACCU-CHEK GUIDE TEST) test strip Use to check sugars once daily in the morning and again 2 hours after dinner   levothyroxine  (SYNTHROID ) 50 MCG tablet Take 1 tablet (50 mcg total) by mouth daily before breakfast.   NON FORMULARY Pt uses a cpap nightly   tirzepatide  (MOUNJARO ) 7.5 MG/0.5ML Pen Inject 7.5 mg into the skin once a week.   DULoxetine (CYMBALTA) 20 MG capsule  Take 40 mg by mouth daily. (Patient not taking: Reported on 09/21/2023)   fluticasone  (FLONASE ) 50 MCG/ACT nasal spray Place 1 spray into both nostrils daily. (Patient not taking: Reported on 09/21/2023)   nystatin  (MYCOSTATIN ) 100000 UNIT/ML suspension Swish and spit 5 mLs (500,000 Units total) by mouth 4 (four) times daily. (Patient not taking: Reported on 09/21/2023)   rosuvastatin  (CRESTOR ) 40 MG tablet Take 1 tablet (40 mg total) by mouth at bedtime. (Patient not taking: Reported on 09/21/2023)   No facility-administered medications prior to visit.    Review of Systems  Constitutional: Negative.   HENT: Negative.    Eyes: Negative.   Respiratory: Negative.  Negative for shortness of breath.    Cardiovascular: Negative.  Negative for chest pain.  Gastrointestinal: Negative.  Negative for abdominal pain, constipation and diarrhea.  Genitourinary: Negative.   Musculoskeletal:  Negative for joint pain and myalgias.  Skin: Negative.   Neurological: Negative.  Negative for dizziness and headaches.  Endo/Heme/Allergies: Negative.   All other systems reviewed and are negative.      Objective:   BP 114/82   Pulse 90   Ht 5' 2 (1.575 m)   Wt 279 lb (126.6 kg)   SpO2 97%   BMI 51.03 kg/m   Vitals:   09/21/23 0911  BP: 114/82  Pulse: 90  Height: 5' 2 (1.575 m)  Weight: 279 lb (126.6 kg)  SpO2: 97%  BMI (Calculated): 51.02    Physical Exam Vitals and nursing note reviewed.  Constitutional:      Appearance: Normal appearance. She is normal weight.  HENT:     Head: Normocephalic and atraumatic.     Nose: Nose normal.     Mouth/Throat:     Mouth: Mucous membranes are moist.  Eyes:     Extraocular Movements: Extraocular movements intact.     Conjunctiva/sclera: Conjunctivae normal.     Pupils: Pupils are equal, round, and reactive to light.  Cardiovascular:     Rate and Rhythm: Normal rate and regular rhythm.     Pulses: Normal pulses.     Heart sounds: Normal heart sounds.  Pulmonary:     Effort: Pulmonary effort is normal.     Breath sounds: Normal breath sounds.  Abdominal:     General: Abdomen is flat. Bowel sounds are normal.     Palpations: Abdomen is soft.  Musculoskeletal:        General: Normal range of motion.     Cervical back: Normal range of motion.  Skin:    General: Skin is warm and dry.  Neurological:     General: No focal deficit present.     Mental Status: She is alert and oriented to person, place, and time.  Psychiatric:        Mood and Affect: Mood normal.        Behavior: Behavior normal.        Thought Content: Thought content normal.        Judgment: Judgment normal.      No results found for any visits on  09/21/23.  Recent Results (from the past 2160 hours)  CMP14+EGFR     Status: Abnormal   Collection Time: 06/28/23 10:10 AM  Result Value Ref Range   Glucose 83 70 - 99 mg/dL   BUN 11 6 - 24 mg/dL   Creatinine, Ser 9.05 0.57 - 1.00 mg/dL   eGFR 73 >40 fO/fpw/8.26   BUN/Creatinine Ratio 12 9 - 23   Sodium 141 134 -  144 mmol/L   Potassium 4.3 3.5 - 5.2 mmol/L   Chloride 105 96 - 106 mmol/L   CO2 22 20 - 29 mmol/L   Calcium  9.5 8.7 - 10.2 mg/dL   Total Protein 6.7 6.0 - 8.5 g/dL   Albumin 4.2 3.8 - 4.9 g/dL   Globulin, Total 2.5 1.5 - 4.5 g/dL   Bilirubin Total 0.5 0.0 - 1.2 mg/dL   Alkaline Phosphatase 71 44 - 121 IU/L   AST 30 0 - 40 IU/L   ALT 35 (H) 0 - 32 IU/L  Lipid Profile     Status: Abnormal   Collection Time: 06/28/23 10:10 AM  Result Value Ref Range   Cholesterol, Total 212 (H) 100 - 199 mg/dL   Triglycerides 895 0 - 149 mg/dL   HDL 44 >60 mg/dL   VLDL Cholesterol Cal 19 5 - 40 mg/dL   LDL Chol Calc (NIH) 850 (H) 0 - 99 mg/dL   Chol/HDL Ratio 4.8 (H) 0.0 - 4.4 ratio    Comment:                                   T. Chol/HDL Ratio                                             Men  Women                               1/2 Avg.Risk  3.4    3.3                                   Avg.Risk  5.0    4.4                                2X Avg.Risk  9.6    7.1                                3X Avg.Risk 23.4   11.0   Hemoglobin A1c     Status: Abnormal   Collection Time: 06/28/23 10:10 AM  Result Value Ref Range   Hgb A1c MFr Bld 6.0 (H) 4.8 - 5.6 %    Comment:          Prediabetes: 5.7 - 6.4          Diabetes: >6.4          Glycemic control for adults with diabetes: <7.0    Est. average glucose Bld gHb Est-mCnc 126 mg/dL  TSH     Status: None   Collection Time: 06/28/23 10:10 AM  Result Value Ref Range   TSH 2.470 0.450 - 4.500 uIU/mL      Assessment & Plan:  Return in one week for lab work Restart rosuvastatin   Problem List Items Addressed This Visit        Cardiovascular and Mediastinum   Essential hypertension, benign   Relevant Orders   CMP14+EGFR     Endocrine   Primary hypothyroidism - Primary   Relevant Orders   TSH   T3,  free   T4, Free   Diabetes mellitus (HCC)   Relevant Orders   Hemoglobin A1c     Other   HLD (hyperlipidemia)   Relevant Orders   Lipid panel   Other fatigue   Relevant Orders   CBC with Differential/Platelet   Vitamin D  (25 hydroxy)   Vitamin B12    Return if symptoms worsen or fail to improve, for as scheduled.   Total time spent: 25 minutes  Google, NP  09/21/2023   This document may have been prepared by Dragon Voice Recognition software and as such may include unintentional dictation errors.

## 2023-09-26 ENCOUNTER — Other Ambulatory Visit: Payer: Self-pay | Admitting: Cardiology

## 2023-09-26 MED ORDER — MOUNJARO 10 MG/0.5ML ~~LOC~~ SOAJ: 10.0000 mg | SUBCUTANEOUS | 6 refills | Status: DC

## 2023-09-30 ENCOUNTER — Other Ambulatory Visit

## 2023-09-30 DIAGNOSIS — E785 Hyperlipidemia, unspecified: Secondary | ICD-10-CM

## 2023-09-30 DIAGNOSIS — E039 Hypothyroidism, unspecified: Secondary | ICD-10-CM

## 2023-09-30 DIAGNOSIS — E1169 Type 2 diabetes mellitus with other specified complication: Secondary | ICD-10-CM

## 2023-09-30 DIAGNOSIS — I10 Essential (primary) hypertension: Secondary | ICD-10-CM

## 2023-09-30 DIAGNOSIS — R5383 Other fatigue: Secondary | ICD-10-CM

## 2023-10-01 LAB — CBC WITH DIFFERENTIAL/PLATELET
Basophils Absolute: 0 x10E3/uL (ref 0.0–0.2)
Basos: 1 %
EOS (ABSOLUTE): 0.1 x10E3/uL (ref 0.0–0.4)
Eos: 2 %
Hematocrit: 45 % (ref 34.0–46.6)
Hemoglobin: 14.5 g/dL (ref 11.1–15.9)
Immature Grans (Abs): 0 x10E3/uL (ref 0.0–0.1)
Immature Granulocytes: 0 %
Lymphocytes Absolute: 1.8 x10E3/uL (ref 0.7–3.1)
Lymphs: 44 %
MCH: 27.6 pg (ref 26.6–33.0)
MCHC: 32.2 g/dL (ref 31.5–35.7)
MCV: 86 fL (ref 79–97)
Monocytes Absolute: 0.3 x10E3/uL (ref 0.1–0.9)
Monocytes: 7 %
Neutrophils Absolute: 1.9 x10E3/uL (ref 1.4–7.0)
Neutrophils: 46 %
Platelets: 224 x10E3/uL (ref 150–450)
RBC: 5.25 x10E6/uL (ref 3.77–5.28)
RDW: 14.6 % (ref 11.7–15.4)
WBC: 4.1 x10E3/uL (ref 3.4–10.8)

## 2023-10-01 LAB — LIPID PANEL
Chol/HDL Ratio: 4.5 ratio — ABNORMAL HIGH (ref 0.0–4.4)
Cholesterol, Total: 210 mg/dL — ABNORMAL HIGH (ref 100–199)
HDL: 47 mg/dL (ref >39)
LDL Chol Calc (NIH): 149 mg/dL — ABNORMAL HIGH (ref 0–99)
Triglycerides: 80 mg/dL (ref 0–149)
VLDL Cholesterol Cal: 14 mg/dL (ref 5–40)

## 2023-10-01 LAB — CMP14+EGFR
ALT: 37 IU/L — ABNORMAL HIGH (ref 0–32)
AST: 29 IU/L (ref 0–40)
Albumin: 4.2 g/dL (ref 3.8–4.9)
Alkaline Phosphatase: 67 IU/L (ref 44–121)
BUN/Creatinine Ratio: 13 (ref 9–23)
BUN: 13 mg/dL (ref 6–24)
Bilirubin Total: 0.4 mg/dL (ref 0.0–1.2)
CO2: 21 mmol/L (ref 20–29)
Calcium: 9.5 mg/dL (ref 8.7–10.2)
Chloride: 104 mmol/L (ref 96–106)
Creatinine, Ser: 1 mg/dL (ref 0.57–1.00)
Globulin, Total: 2.9 g/dL (ref 1.5–4.5)
Glucose: 93 mg/dL (ref 70–99)
Potassium: 4.4 mmol/L (ref 3.5–5.2)
Sodium: 141 mmol/L (ref 134–144)
Total Protein: 7.1 g/dL (ref 6.0–8.5)
eGFR: 68 mL/min/1.73 (ref >59)

## 2023-10-01 LAB — HEMOGLOBIN A1C
Est. average glucose Bld gHb Est-mCnc: 114 mg/dL
Hgb A1c MFr Bld: 5.6 % (ref 4.8–5.6)

## 2023-10-01 LAB — VITAMIN B12: Vitamin B-12: 545 pg/mL (ref 232–1245)

## 2023-10-01 LAB — T4, FREE: Free T4: 1.13 ng/dL (ref 0.82–1.77)

## 2023-10-01 LAB — TSH: TSH: 1.95 u[IU]/mL (ref 0.450–4.500)

## 2023-10-01 LAB — T3, FREE: T3, Free: 2.8 pg/mL (ref 2.0–4.4)

## 2023-10-01 LAB — VITAMIN D 25 HYDROXY (VIT D DEFICIENCY, FRACTURES): Vit D, 25-Hydroxy: 29.2 ng/mL — ABNORMAL LOW (ref 30.0–100.0)

## 2023-10-03 ENCOUNTER — Ambulatory Visit: Payer: Self-pay | Admitting: Cardiology

## 2023-11-01 ENCOUNTER — Encounter: Payer: Self-pay | Admitting: Cardiology

## 2023-11-01 ENCOUNTER — Ambulatory Visit: Admitting: Cardiology

## 2023-11-01 VITALS — BP 98/66 | HR 95 | Ht 62.0 in | Wt 272.0 lb

## 2023-11-01 DIAGNOSIS — I10 Essential (primary) hypertension: Secondary | ICD-10-CM | POA: Diagnosis not present

## 2023-11-01 DIAGNOSIS — E1169 Type 2 diabetes mellitus with other specified complication: Secondary | ICD-10-CM | POA: Diagnosis not present

## 2023-11-01 DIAGNOSIS — E785 Hyperlipidemia, unspecified: Secondary | ICD-10-CM | POA: Diagnosis not present

## 2023-11-01 MED ORDER — ROSUVASTATIN CALCIUM 10 MG PO TABS: 10.0000 mg | ORAL_TABLET | Freq: Every day | ORAL | 1 refills | Status: AC

## 2023-11-01 MED ORDER — NYSTATIN 100000 UNIT/GM EX CREA: 1.0000 | TOPICAL_CREAM | Freq: Two times a day (BID) | CUTANEOUS | 0 refills | Status: AC

## 2023-11-01 NOTE — Progress Notes (Signed)
 Established Patient Office Visit  Subjective:  Patient ID: Leslie Meadows, female    DOB: 11-28-1970  Age: 53 y.o. MRN: [id number]  Chief Complaint  Patient presents with   Follow-up    4 months follow up lab results    Patient in office for 4 month follow up, discuss recent lab results. Patient doing well, reports occasional rash under breasts. Will send in nystatin  cream. Discussed recent lab work. A1c improved. LDL unchanged, elevated. Patient admits to stopping her rosuvastatin  about a month ago. Will restart statin.  Up to date on pap smear, colonoscopy, pap smear. [facility]  Patient weight down 20 lbs on Mounjaro , states she is eating less. The patient is asked to make an attempt to improve diet and exercise patterns to aid in medical management of this problem. Continue same medications.     No other concerns at this time.   Past Medical History:  Diagnosis Date   Chest pain 10/29/2020   Diabetes mellitus without complication (HCC)    HLD (hyperlipidemia)    Morbid obesity with BMI of 50.0-59.9, adult (HCC)    Neck pain 10/31/2020   Overweight 05/24/2022   Sleep apnea    Thyroid  disease     Past Surgical History:  Procedure Laterality Date   ABDOMINAL HYSTERECTOMY     ANTERIOR CERVICAL DECOMP/DISCECTOMY FUSION N/A 10/27/2020   Procedure: C4-5 ANTERIOR CERVICAL DECOMPRESSION/DISCECTOMY FUSION 1 LEVEL;  Surgeon: Bluford Standing, MD;  Location: [facility];  Service: Neurosurgery;  Laterality: N/A;   COLONOSCOPY WITH PROPOFOL  N/A 05/21/2022   Procedure: COLONOSCOPY WITH PROPOFOL ;  Surgeon: Maryruth Ole DASEN, MD;  Location: [facility];  Service: Endoscopy;  Laterality: N/A;  DM   TUBAL LIGATION     ulna nerve decompression Left     Social History   Socioeconomic History   Marital status: Divorced    Spouse name: Not on file   Number of children: Not on file   Years of education: Not on file   Highest education  level: Not on file  Occupational History   Not on file  Tobacco Use   Smoking status: Never   Smokeless tobacco: Never  Vaping Use   Vaping status: Never Used  Substance and Sexual Activity   Alcohol use: No    Alcohol/week: 0.0 standard drinks of alcohol   Drug use: No   Sexual activity: Not on file  Other Topics Concern   Not on file  Social History Narrative   Not on file   Social Drivers of Health   Financial Resource Strain: Not on file  Food Insecurity: Not on file  Transportation Needs: Not on file  Physical Activity: Not on file  Stress: Not on file  Social Connections: Not on file  Intimate Partner Violence: Not on file    Family History  Problem Relation Age of Onset   Breast cancer Paternal Grandmother     Allergies  Allergen Reactions   Trulicity  [Dulaglutide ] Nausea Only and Other (See Comments)    Abdominal pain    Outpatient Medications Prior to Visit  Medication Sig   Accu-Chek Softclix Lancets lancets Use as instructed   cyclobenzaprine  (FLEXERIL ) 10 MG tablet Take 1 tablet (10 mg total) by mouth 3 (three) times daily as needed for muscle spasms.   glipiZIDE  (GLUCOTROL  XL) 10 MG 24 hr tablet TAKE 1 TABLET (10 MG TOTAL) BY MOUTH DAILY WITH BREAKFAST.   glucose blood (ACCU-CHEK GUIDE TEST) test  strip Use to check sugars once daily in the morning and again 2 hours after dinner   levothyroxine  (SYNTHROID ) 50 MCG tablet Take 1 tablet (50 mcg total) by mouth daily before breakfast.   NON FORMULARY Pt uses a cpap nightly   tirzepatide  (MOUNJARO ) 10 MG/0.5ML Pen Inject 10 mg into the skin once a week.   [DISCONTINUED] DULoxetine (CYMBALTA) 20 MG capsule Take 40 mg by mouth daily. (Patient not taking: Reported on 09/21/2023)   [DISCONTINUED] fluticasone  (FLONASE ) 50 MCG/ACT nasal spray Place 1 spray into both nostrils daily. (Patient not taking: Reported on 09/21/2023)   [DISCONTINUED] nystatin  (MYCOSTATIN ) 100000 UNIT/ML suspension Swish and spit 5 mLs  (500,000 Units total) by mouth 4 (four) times daily. (Patient not taking: Reported on 09/21/2023)   [DISCONTINUED] rosuvastatin  (CRESTOR ) 40 MG tablet Take 1 tablet (40 mg total) by mouth at bedtime. (Patient not taking: Reported on 09/21/2023)   No facility-administered medications prior to visit.    Review of Systems  Constitutional: Negative.   HENT: Negative.    Eyes: Negative.   Respiratory: Negative.  Negative for shortness of breath.   Cardiovascular: Negative.  Negative for chest pain.  Gastrointestinal: Negative.  Negative for abdominal pain, constipation and diarrhea.  Genitourinary: Negative.   Musculoskeletal:  Negative for joint pain and myalgias.  Skin: Negative.   Neurological: Negative.  Negative for dizziness and headaches.  Endo/Heme/Allergies: Negative.   All other systems reviewed and are negative.      Objective:   BP 98/66   Pulse 95   Ht 5' 2 (1.575 m)   Wt 272 lb (123.4 kg)   PF 98 L/min   BMI 49.75 kg/m   Vitals:   11/01/23 0905  BP: 98/66  Pulse: 95  Height: 5' 2 (1.575 m)  Weight: 272 lb (123.4 kg)  PF: 98 L/min  BMI (Calculated): 49.74    Physical Exam Vitals and nursing note reviewed.  Constitutional:      Appearance: Normal appearance. She is normal weight.  HENT:     Head: Normocephalic and atraumatic.     Nose: Nose normal.     Mouth/Throat:     Mouth: Mucous membranes are moist.  Eyes:     Extraocular Movements: Extraocular movements intact.     Conjunctiva/sclera: Conjunctivae normal.     Pupils: Pupils are equal, round, and reactive to light.  Cardiovascular:     Rate and Rhythm: Normal rate and regular rhythm.     Pulses: Normal pulses.     Heart sounds: Normal heart sounds.  Pulmonary:     Effort: Pulmonary effort is normal.     Breath sounds: Normal breath sounds.  Abdominal:     General: Abdomen is flat. Bowel sounds are normal.     Palpations: Abdomen is soft.  Musculoskeletal:        General: Normal range of  motion.     Cervical back: Normal range of motion.  Skin:    General: Skin is warm and dry.  Neurological:     General: No focal deficit present.     Mental Status: She is alert and oriented to person, place, and time.  Psychiatric:        Mood and Affect: Mood normal.        Behavior: Behavior normal.        Thought Content: Thought content normal.        Judgment: Judgment normal.      No results found for any visits on 11/01/23.  Recent Results (from the past 2160 hours)  T4, Free     Status: None   Collection Time: 09/30/23 12:30 PM  Result Value Ref Range   Free T4 1.13 0.82 - 1.77 ng/dL  T3, free     Status: None   Collection Time: 09/30/23 12:30 PM  Result Value Ref Range   T3, Free 2.8 2.0 - 4.4 pg/mL  TSH     Status: None   Collection Time: 09/30/23 12:30 PM  Result Value Ref Range   TSH 1.950 0.450 - 4.500 uIU/mL  Vitamin B12     Status: None   Collection Time: 09/30/23 12:30 PM  Result Value Ref Range   Vitamin B-12 545 232 - 1,245 pg/mL  Vitamin D  (25 hydroxy)     Status: Abnormal   Collection Time: 09/30/23 12:30 PM  Result Value Ref Range   Vit D, 25-Hydroxy 29.2 (L) 30.0 - 100.0 ng/mL    Comment: Vitamin D  deficiency has been defined by the Institute of Medicine and an Endocrine Society practice guideline as a level of serum 25-OH vitamin D  less than 20 ng/mL (1,2). The Endocrine Society went on to further define vitamin D  insufficiency as a level between 21 and 29 ng/mL (2). 1. IOM (Institute of Medicine). 2010. Dietary reference    intakes for calcium  and D. [place]: The    [employer]. 2. Holick MF, Binkley [place], Bischoff-Ferrari HA, et al.    Evaluation, treatment, and prevention of vitamin D     deficiency: an Endocrine Society clinical practice    guideline. JCEM. 2011 Jul; 96(7):1911-30.   Hemoglobin A1c     Status: None   Collection Time: 09/30/23 12:30 PM  Result Value Ref Range   Hgb A1c MFr Bld 5.6 4.8 - 5.6 %     Comment:          Prediabetes: 5.7 - 6.4          Diabetes: >6.4          Glycemic control for adults with diabetes: <7.0    Est. average glucose Bld gHb Est-mCnc 114 mg/dL  CBC with Differential/Platelet     Status: None   Collection Time: 09/30/23 12:30 PM  Result Value Ref Range   WBC 4.1 3.4 - 10.8 x10E3/uL   RBC 5.25 3.77 - 5.28 x10E6/uL   Hemoglobin 14.5 11.1 - 15.9 g/dL   Hematocrit 54.9 65.9 - 46.6 %   MCV 86 79 - 97 fL   MCH 27.6 26.6 - 33.0 pg   MCHC 32.2 31.5 - 35.7 g/dL   RDW 85.3 88.2 - 84.5 %   Platelets 224 150 - 450 x10E3/uL   Neutrophils 46 Not Estab. %   Lymphs 44 Not Estab. %   Monocytes 7 Not Estab. %   Eos 2 Not Estab. %   Basos 1 Not Estab. %   Neutrophils Absolute 1.9 1.4 - 7.0 x10E3/uL   Lymphocytes Absolute 1.8 0.7 - 3.1 x10E3/uL   Monocytes Absolute 0.3 0.1 - 0.9 x10E3/uL   EOS (ABSOLUTE) 0.1 0.0 - 0.4 x10E3/uL   Basophils Absolute 0.0 0.0 - 0.2 x10E3/uL   Immature Granulocytes 0 Not Estab. %   Immature Grans (Abs) 0.0 0.0 - 0.1 x10E3/uL  Lipid panel     Status: Abnormal   Collection Time: 09/30/23 12:30 PM  Result Value Ref Range   Cholesterol, Total 210 (H) 100 - 199 mg/dL   Triglycerides 80 0 - 149 mg/dL   HDL 47 >60 mg/dL  VLDL Cholesterol Cal 14 5 - 40 mg/dL   LDL Chol Calc (NIH) 850 (H) 0 - 99 mg/dL   Chol/HDL Ratio 4.5 (H) 0.0 - 4.4 ratio    Comment:                                   T. Chol/HDL Ratio                                             Men  Women                               1/2 Avg.Risk  3.4    3.3                                   Avg.Risk  5.0    4.4                                2X Avg.Risk  9.6    7.1                                3X Avg.Risk 23.4   11.0   CMP14+EGFR     Status: Abnormal   Collection Time: 09/30/23 12:30 PM  Result Value Ref Range   Glucose 93 70 - 99 mg/dL   BUN 13 6 - 24 mg/dL   Creatinine, Ser 8.99 0.57 - 1.00 mg/dL   eGFR 68 >40 fO/fpw/8.26   BUN/Creatinine Ratio 13 9 - 23   Sodium 141 134 -  144 mmol/L   Potassium 4.4 3.5 - 5.2 mmol/L   Chloride 104 96 - 106 mmol/L   CO2 21 20 - 29 mmol/L   Calcium  9.5 8.7 - 10.2 mg/dL   Total Protein 7.1 6.0 - 8.5 g/dL   Albumin 4.2 3.8 - 4.9 g/dL   Globulin, Total 2.9 1.5 - 4.5 g/dL   Bilirubin Total 0.4 0.0 - 1.2 mg/dL   Alkaline Phosphatase 67 44 - 121 IU/L   AST 29 0 - 40 IU/L   ALT 37 (H) 0 - 32 IU/L      Assessment & Plan:  Nystatin  cream Restart statin  Problem List Items Addressed This Visit       Cardiovascular and Mediastinum   Essential hypertension, benign - Primary   Relevant Medications   rosuvastatin  (CRESTOR ) 10 MG tablet     Endocrine   Diabetes mellitus (HCC)   Relevant Medications   rosuvastatin  (CRESTOR ) 10 MG tablet     Other   HLD (hyperlipidemia)   Relevant Medications   rosuvastatin  (CRESTOR ) 10 MG tablet    Return in about 4 months (around 03/02/2024) for fasting labs prior.   Total time spent: 25 minutes  Google, NP  11/01/2023   This document may have been prepared by Dragon Voice Recognition software and as such may include unintentional dictation errors.

## 2023-11-26 ENCOUNTER — Other Ambulatory Visit: Payer: Self-pay | Admitting: Cardiology

## 2023-12-08 ENCOUNTER — Ambulatory Visit: Admitting: Cardiology

## 2024-01-02 DIAGNOSIS — G540 Brachial plexus disorders: Secondary | ICD-10-CM | POA: Insufficient documentation

## 2024-01-16 ENCOUNTER — Encounter: Payer: Self-pay | Admitting: Cardiology

## 2024-01-17 ENCOUNTER — Other Ambulatory Visit: Payer: Self-pay | Admitting: Cardiology

## 2024-01-17 MED ORDER — MOUNJARO 12.5 MG/0.5ML ~~LOC~~ SOAJ: 12.5000 mg | SUBCUTANEOUS | 4 refills | Status: AC

## 2024-02-20 ENCOUNTER — Ambulatory Visit: Payer: Self-pay | Admitting: Internal Medicine

## 2024-02-20 VITALS — BP 106/89 | HR 86 | Resp 20 | Ht 62.0 in | Wt 258.3 lb

## 2024-02-20 DIAGNOSIS — Z7189 Other specified counseling: Secondary | ICD-10-CM | POA: Diagnosis not present

## 2024-02-20 DIAGNOSIS — G4733 Obstructive sleep apnea (adult) (pediatric): Secondary | ICD-10-CM | POA: Diagnosis not present

## 2024-02-20 NOTE — Patient Instructions (Signed)

## 2024-03-02 ENCOUNTER — Other Ambulatory Visit

## 2024-03-02 DIAGNOSIS — E785 Hyperlipidemia, unspecified: Secondary | ICD-10-CM

## 2024-03-02 DIAGNOSIS — R5383 Other fatigue: Secondary | ICD-10-CM

## 2024-03-02 DIAGNOSIS — E039 Hypothyroidism, unspecified: Secondary | ICD-10-CM

## 2024-03-02 DIAGNOSIS — I10 Essential (primary) hypertension: Secondary | ICD-10-CM

## 2024-03-02 DIAGNOSIS — E1169 Type 2 diabetes mellitus with other specified complication: Secondary | ICD-10-CM

## 2024-03-03 LAB — CBC WITH DIFFERENTIAL/PLATELET
Basophils Absolute: 0 10*3/uL (ref 0.0–0.2)
Basos: 1 %
EOS (ABSOLUTE): 0.1 10*3/uL (ref 0.0–0.4)
Eos: 1 %
Hematocrit: 46.7 % — ABNORMAL HIGH (ref 34.0–46.6)
Hemoglobin: 14.7 g/dL (ref 11.1–15.9)
Immature Grans (Abs): 0 10*3/uL (ref 0.0–0.1)
Immature Granulocytes: 0 %
Lymphocytes Absolute: 1.9 10*3/uL (ref 0.7–3.1)
Lymphs: 47 %
MCH: 27.1 pg (ref 26.6–33.0)
MCHC: 31.5 g/dL (ref 31.5–35.7)
MCV: 86 fL (ref 79–97)
Monocytes Absolute: 0.3 10*3/uL (ref 0.1–0.9)
Monocytes: 7 %
Neutrophils Absolute: 1.8 10*3/uL (ref 1.4–7.0)
Neutrophils: 44 %
Platelets: 224 10*3/uL (ref 150–450)
RBC: 5.42 x10E6/uL — ABNORMAL HIGH (ref 3.77–5.28)
RDW: 14.2 % (ref 11.7–15.4)
WBC: 4.1 10*3/uL (ref 3.4–10.8)

## 2024-03-03 LAB — CMP14+EGFR
ALT: 22 [IU]/L (ref 0–32)
AST: 23 [IU]/L (ref 0–40)
Albumin: 4 g/dL (ref 3.8–4.9)
Alkaline Phosphatase: 73 [IU]/L (ref 49–135)
BUN/Creatinine Ratio: 12 (ref 9–23)
BUN: 12 mg/dL (ref 6–24)
Bilirubin Total: 0.6 mg/dL (ref 0.0–1.2)
CO2: 18 mmol/L — ABNORMAL LOW (ref 20–29)
Calcium: 9.7 mg/dL (ref 8.7–10.2)
Chloride: 103 mmol/L (ref 96–106)
Creatinine, Ser: 1.04 mg/dL — ABNORMAL HIGH (ref 0.57–1.00)
Globulin, Total: 3.2 g/dL (ref 1.5–4.5)
Glucose: 76 mg/dL (ref 70–99)
Potassium: 4.4 mmol/L (ref 3.5–5.2)
Sodium: 140 mmol/L (ref 134–144)
Total Protein: 7.2 g/dL (ref 6.0–8.5)
eGFR: 64 mL/min/{1.73_m2}

## 2024-03-03 LAB — LIPID PANEL
Chol/HDL Ratio: 2.6 ratio (ref 0.0–4.4)
Cholesterol, Total: 161 mg/dL (ref 100–199)
HDL: 61 mg/dL
LDL Chol Calc (NIH): 78 mg/dL (ref 0–99)
Triglycerides: 123 mg/dL (ref 0–149)
VLDL Cholesterol Cal: 22 mg/dL (ref 5–40)

## 2024-03-03 LAB — T3, FREE: T3, Free: 1.8 pg/mL — ABNORMAL LOW (ref 2.0–4.4)

## 2024-03-03 LAB — VITAMIN B12: Vitamin B-12: 417 pg/mL (ref 232–1245)

## 2024-03-03 LAB — TSH: TSH: 3.05 u[IU]/mL (ref 0.450–4.500)

## 2024-03-03 LAB — HEMOGLOBIN A1C
Est. average glucose Bld gHb Est-mCnc: 111 mg/dL
Hgb A1c MFr Bld: 5.5 % (ref 4.8–5.6)

## 2024-03-03 LAB — VITAMIN D 25 HYDROXY (VIT D DEFICIENCY, FRACTURES): Vit D, 25-Hydroxy: 32.5 ng/mL (ref 30.0–100.0)

## 2024-03-03 LAB — T4, FREE: Free T4: 1.31 ng/dL (ref 0.82–1.77)

## 2024-03-06 ENCOUNTER — Ambulatory Visit: Payer: Self-pay | Admitting: Cardiology

## 2024-03-09 ENCOUNTER — Ambulatory Visit: Admitting: Cardiology

## 2024-03-09 ENCOUNTER — Ambulatory Visit: Payer: Self-pay | Admitting: Cardiology

## 2024-03-09 ENCOUNTER — Encounter: Payer: Self-pay | Admitting: Cardiology

## 2024-03-09 VITALS — BP 110/68 | HR 88 | Ht 62.0 in | Wt 256.0 lb

## 2024-03-09 DIAGNOSIS — E1169 Type 2 diabetes mellitus with other specified complication: Secondary | ICD-10-CM | POA: Insufficient documentation

## 2024-03-09 DIAGNOSIS — Z713 Dietary counseling and surveillance: Secondary | ICD-10-CM

## 2024-03-09 DIAGNOSIS — Z6841 Body Mass Index (BMI) 40.0 and over, adult: Secondary | ICD-10-CM

## 2024-03-09 DIAGNOSIS — G4733 Obstructive sleep apnea (adult) (pediatric): Secondary | ICD-10-CM

## 2024-03-09 DIAGNOSIS — E782 Mixed hyperlipidemia: Secondary | ICD-10-CM

## 2024-03-09 DIAGNOSIS — E1159 Type 2 diabetes mellitus with other circulatory complications: Secondary | ICD-10-CM | POA: Diagnosis not present

## 2024-03-09 DIAGNOSIS — I152 Hypertension secondary to endocrine disorders: Secondary | ICD-10-CM | POA: Diagnosis not present

## 2024-03-09 DIAGNOSIS — E1165 Type 2 diabetes mellitus with hyperglycemia: Secondary | ICD-10-CM | POA: Insufficient documentation

## 2024-03-09 DIAGNOSIS — E039 Hypothyroidism, unspecified: Secondary | ICD-10-CM

## 2024-03-09 DIAGNOSIS — E66813 Obesity, class 3: Secondary | ICD-10-CM | POA: Diagnosis not present

## 2024-03-09 LAB — POCT UA - MICROALBUMIN
Albumin/Creatinine Ratio, Urine, POC: <30
Creatinine, POC: 300 mg/dL
Microalbumin Ur, POC: 80 mg/L

## 2024-03-09 NOTE — Progress Notes (Signed)
 "  Established Patient Office Visit  Subjective:  Patient ID: Leslie Meadows, female    DOB: March 14, 1970  Age: 54 y.o. MRN: [id number]  Chief Complaint  Patient presents with   Follow-up    1 month follow up    Patient in office for follow up, discuss recent lab results. Patient doing well, no complaints today.  Due for urine micro, will do today.  Discussed recent lab work. Hgb A1c at goal. Patient not interested in increasing Mounjaro  at this time. LDL improved. TSH normal.  Blood pressure well controlled.  Using and benefiting from CPAP machine.  Continue current medications.     No other concerns at this time.   Past Medical History:  Diagnosis Date   Chest pain 10/29/2020   Diabetes mellitus without complication (HCC)    HLD (hyperlipidemia)    Morbid obesity with BMI of 50.0-59.9, adult (HCC)    Neck pain 10/31/2020   Overweight 05/24/2022   Sleep apnea    Thyroid  disease     Past Surgical History:  Procedure Laterality Date   ABDOMINAL HYSTERECTOMY     ANTERIOR CERVICAL DECOMP/DISCECTOMY FUSION N/A 10/27/2020   Procedure: C4-5 ANTERIOR CERVICAL DECOMPRESSION/DISCECTOMY FUSION 1 LEVEL;  Surgeon: Bluford Standing, MD;  Location: [facility] ORS;  Service: Neurosurgery;  Laterality: N/A;   COLONOSCOPY WITH PROPOFOL  N/A 05/21/2022   Procedure: COLONOSCOPY WITH PROPOFOL ;  Surgeon: Maryruth Ole DASEN, MD;  Location: [facility];  Service: Endoscopy;  Laterality: N/A;  DM   TUBAL LIGATION     ulna nerve decompression Left     Social History   Socioeconomic History   Marital status: Divorced    Spouse name: Not on file   Number of children: Not on file   Years of education: Not on file   Highest education level: Not on file  Occupational History   Not on file  Tobacco Use   Smoking status: Never   Smokeless tobacco: Never  Vaping Use   Vaping status: Never Used  Substance and Sexual Activity   Alcohol use: No    Alcohol/week: 0.0 standard drinks of alcohol   Drug  use: No   Sexual activity: Not on file  Other Topics Concern   Not on file  Social History Narrative   Not on file   Social Drivers of Health   Tobacco Use: Low Risk (03/09/2024)   Patient History    Smoking Tobacco Use: Never    Smokeless Tobacco Use: Never    Passive Exposure: Not on file  Financial Resource Strain: Low Risk  (01/07/2024)   Received from [facility]   Overall Financial Resource Strain (CARDIA)    Difficulty of Paying Living Expenses: Not hard at all  Food Insecurity: No Food Insecurity (01/07/2024)   Received from [facility]   Epic    Within the past 12 months, you worried that your food would run out before you got the money to buy more.: Never true    Within the past 12 months, the food you bought just didn't last and you didn't have money to get more.: Never true  Transportation Needs: No Transportation Needs (01/07/2024)   Received from [facility] - Transportation    In the past 12 months, has lack of transportation kept you from medical appointments or from getting medications?: No    Lack of Transportation (Non-Medical): No  Physical Activity: Not on file  Stress: Not on file  Social Connections:  Not on file  Intimate Partner Violence: Not on file  Depression (EYV7-0): Not on file  Alcohol Screen: Not on file  Housing: Unknown (01/07/2024)   Received from [facility]   Epic    In the last 12 months, was there a time when you were not able to pay the mortgage or rent on time?: No    Number of Times Moved in the Last Year: Not on file    At any time in the past 12 months, were you homeless or living in a shelter (including now)?: No  Utilities: Not At Risk (01/07/2024)   Received from [facility]   Epic    In the past 12 months has the electric, gas, oil, or water company threatened to shut off services in your home?: No  Health Literacy: Not on file     Family History  Problem Relation Age of Onset   Breast cancer Paternal Grandmother     Allergies[1]  Show/hide medication list[2]  Review of Systems  Constitutional: Negative.   HENT: Negative.    Eyes: Negative.   Respiratory: Negative.  Negative for shortness of breath.   Cardiovascular: Negative.  Negative for chest pain.  Gastrointestinal: Negative.  Negative for abdominal pain, constipation and diarrhea.  Genitourinary: Negative.   Musculoskeletal:  Negative for joint pain and myalgias.  Skin: Negative.   Neurological: Negative.  Negative for dizziness and headaches.  Endo/Heme/Allergies: Negative.   All other systems reviewed and are negative.      Objective:   BP 110/68   Pulse 88   Ht 5' 2 (1.575 m)   Wt 256 lb (116.1 kg)   SpO2 98%   BMI 46.82 kg/m   Vitals:   03/09/24 0918  BP: 110/68  Pulse: 88  Height: 5' 2 (1.575 m)  Weight: 256 lb (116.1 kg)  SpO2: 98%  BMI (Calculated): 46.81    Physical Exam Vitals and nursing note reviewed.  Constitutional:      Appearance: Normal appearance. She is normal weight.  HENT:     Head: Normocephalic and atraumatic.     Nose: Nose normal.     Mouth/Throat:     Mouth: Mucous membranes are moist.  Eyes:     Extraocular Movements: Extraocular movements intact.     Conjunctiva/sclera: Conjunctivae normal.     Pupils: Pupils are equal, round, and reactive to light.  Cardiovascular:     Rate and Rhythm: Normal rate and regular rhythm.     Pulses: Normal pulses.     Heart sounds: Normal heart sounds.  Pulmonary:     Effort: Pulmonary effort is normal.     Breath sounds: Normal breath sounds.  Abdominal:     General: Abdomen is flat. Bowel sounds are normal.     Palpations: Abdomen is soft.  Musculoskeletal:        General: Normal range of motion.     Cervical back: Normal range of motion.  Skin:    General: Skin is warm and dry.  Neurological:     General: No focal deficit present.     Mental  Status: She is alert and oriented to person, place, and time.  Psychiatric:        Mood and Affect: Mood normal.        Behavior: Behavior normal.        Thought Content: Thought content normal.        Judgment: Judgment normal.      Results  for orders placed or performed in visit on 03/09/24  POCT Urine Albumin/Creatinine with ratio [[id number]]  Result Value Ref Range   Microalbumin Ur, POC 80 mg/L   Creatinine, POC 300 mg/dL   Albumin/Creatinine Ratio, Urine, POC <30     Recent Results (from the past 2160 hours)  CMP14+EGFR     Status: Abnormal   Collection Time: 03/02/24 10:45 AM  Result Value Ref Range   Glucose 76 70 - 99 mg/dL   BUN 12 6 - 24 mg/dL   Creatinine, Ser 8.95 (H) 0.57 - 1.00 mg/dL   eGFR 64 >40 fO/fpw/8.26   BUN/Creatinine Ratio 12 9 - 23   Sodium 140 134 - 144 mmol/L   Potassium 4.4 3.5 - 5.2 mmol/L   Chloride 103 96 - 106 mmol/L   CO2 18 (L) 20 - 29 mmol/L   Calcium  9.7 8.7 - 10.2 mg/dL   Total Protein 7.2 6.0 - 8.5 g/dL   Albumin 4.0 3.8 - 4.9 g/dL   Globulin, Total 3.2 1.5 - 4.5 g/dL   Bilirubin Total 0.6 0.0 - 1.2 mg/dL   Alkaline Phosphatase 73 49 - 135 IU/L   AST 23 0 - 40 IU/L   ALT 22 0 - 32 IU/L  T4, Free     Status: None   Collection Time: 03/02/24 10:46 AM  Result Value Ref Range   Free T4 1.31 0.82 - 1.77 ng/dL  T3, free     Status: Abnormal   Collection Time: 03/02/24 10:46 AM  Result Value Ref Range   T3, Free 1.8 (L) 2.0 - 4.4 pg/mL  TSH     Status: None   Collection Time: 03/02/24 10:46 AM  Result Value Ref Range   TSH 3.050 0.450 - 4.500 uIU/mL  Vitamin B12     Status: None   Collection Time: 03/02/24 10:46 AM  Result Value Ref Range   Vitamin B-12 417 232 - 1,245 pg/mL  Vitamin D  (25 hydroxy)     Status: None   Collection Time: 03/02/24 10:46 AM  Result Value Ref Range   Vit D, 25-Hydroxy 32.5 30.0 - 100.0 ng/mL    Comment: Vitamin D  deficiency has been defined by the Institute of Medicine and an Endocrine Society  practice guideline as a level of serum 25-OH vitamin D  less than 20 ng/mL (1,2). The Endocrine Society went on to further define vitamin D  insufficiency as a level between 21 and 29 ng/mL (2). 1. IOM (Institute of Medicine). 2010. Dietary reference    intakes for calcium  and D. [place]: The    [employer]. 2. Holick MF, Binkley Revere, Bischoff-Ferrari HA, et al.    Evaluation, treatment, and prevention of vitamin D     deficiency: an Endocrine Society clinical practice    guideline. JCEM. 2011 Jul; 96(7):1911-30.   Hemoglobin A1c     Status: None   Collection Time: 03/02/24 10:46 AM  Result Value Ref Range   Hgb A1c MFr Bld 5.5 4.8 - 5.6 %    Comment:          Prediabetes: 5.7 - 6.4          Diabetes: >6.4          Glycemic control for adults with diabetes: <7.0    Est. average glucose Bld gHb Est-mCnc 111 mg/dL  CBC with Differential/Platelet     Status: Abnormal   Collection Time: 03/02/24 10:46 AM  Result Value Ref Range   WBC 4.1 3.4 - 10.8 x10E3/uL  RBC 5.42 (H) 3.77 - 5.28 x10E6/uL   Hemoglobin 14.7 11.1 - 15.9 g/dL   Hematocrit 53.2 (H) 65.9 - 46.6 %   MCV 86 79 - 97 fL   MCH 27.1 26.6 - 33.0 pg   MCHC 31.5 31.5 - 35.7 g/dL   RDW 85.7 88.2 - 84.5 %   Platelets 224 150 - 450 x10E3/uL   Neutrophils 44 Not Estab. %   Lymphs 47 Not Estab. %   Monocytes 7 Not Estab. %   Eos 1 Not Estab. %   Basos 1 Not Estab. %   Neutrophils Absolute 1.8 1.4 - 7.0 x10E3/uL   Lymphocytes Absolute 1.9 0.7 - 3.1 x10E3/uL   Monocytes Absolute 0.3 0.1 - 0.9 x10E3/uL   EOS (ABSOLUTE) 0.1 0.0 - 0.4 x10E3/uL   Basophils Absolute 0.0 0.0 - 0.2 x10E3/uL   Immature Granulocytes 0 Not Estab. %   Immature Grans (Abs) 0.0 0.0 - 0.1 x10E3/uL  Lipid panel     Status: None   Collection Time: 03/02/24 10:46 AM  Result Value Ref Range   Cholesterol, Total 161 100 - 199 mg/dL   Triglycerides 876 0 - 149 mg/dL   HDL 61 >60 mg/dL   VLDL Cholesterol Cal 22 5 - 40 mg/dL   LDL Chol Calc  (NIH) 78 0 - 99 mg/dL   Chol/HDL Ratio 2.6 0.0 - 4.4 ratio    Comment:                                   T. Chol/HDL Ratio                                             Men  Women                               1/2 Avg.Risk  3.4    3.3                                   Avg.Risk  5.0    4.4                                2X Avg.Risk  9.6    7.1                                3X Avg.Risk 23.4   11.0   POCT Urine Albumin/Creatinine with ratio [[id number]]     Status: Normal   Collection Time: 03/09/24  9:48 AM  Result Value Ref Range   Microalbumin Ur, POC 80 mg/L   Creatinine, POC 300 mg/dL   Albumin/Creatinine Ratio, Urine, POC <30       Assessment & Plan:  Urine micro today Continue current medications  Problem List Items Addressed This Visit       Cardiovascular and Mediastinum   Hypertension associated with diabetes (HCC) - Primary   Relevant Orders   POCT Urine Albumin/Creatinine with ratio [[id number]] (Completed)     Respiratory   OSA on CPAP     Endocrine   Primary hypothyroidism  Combined hyperlipidemia associated with type 2 diabetes mellitus (HCC)   Type 2 diabetes mellitus with hyperglycemia, without long-term current use of insulin (HCC)     Other   Obesity, unspecified   Weight loss counseling, encounter for    Return in about 4 months (around 07/07/2024) for fasting lab work prior.   Total time spent: 25 minutes. This time includes review of previous notes and results and patient face to face interaction during today's visit.    Jeoffrey Pollen, NP  03/09/2024   This document may have been prepared by Dragon Voice Recognition software and as such may include unintentional dictation errors.     [1]  Allergies Allergen Reactions   Trulicity  [Dulaglutide ] Nausea Only and Other (See Comments)    Abdominal pain  [2]  Outpatient Medications Prior to Visit  Medication Sig   Accu-Chek Softclix Lancets lancets Use as instructed   cyclobenzaprine  (FLEXERIL )  10 MG tablet Take 1 tablet (10 mg total) by mouth 3 (three) times daily as needed for muscle spasms.   glucose blood (ACCU-CHEK GUIDE TEST) test strip Use to check sugars once daily in the morning and again 2 hours after dinner   levothyroxine  (SYNTHROID ) 50 MCG tablet Take 1 tablet (50 mcg total) by mouth daily before breakfast.   NON FORMULARY Pt uses a cpap nightly   nystatin  cream (MYCOSTATIN ) Apply 1 Application topically 2 (two) times daily.   rosuvastatin  (CRESTOR ) 10 MG tablet Take 1 tablet (10 mg total) by mouth daily.   tirzepatide  (MOUNJARO ) 12.5 MG/0.5ML Pen Inject 12.5 mg into the skin once a week.   No facility-administered medications prior to visit.   "
